# Patient Record
Sex: Female | Born: 1965 | Hispanic: Yes | Marital: Single | Smoking: Never smoker
Health system: Southern US, Community
[De-identification: ages and names within clinical notes are randomized; demographics above are authoritative.]

## PROBLEM LIST (undated history)

## (undated) DIAGNOSIS — R739 Hyperglycemia, unspecified: Secondary | ICD-10-CM

## (undated) DIAGNOSIS — T8859XA Other complications of anesthesia, initial encounter: Secondary | ICD-10-CM

## (undated) DIAGNOSIS — J302 Other seasonal allergic rhinitis: Secondary | ICD-10-CM

## (undated) DIAGNOSIS — R42 Dizziness and giddiness: Secondary | ICD-10-CM

## (undated) DIAGNOSIS — M1990 Unspecified osteoarthritis, unspecified site: Secondary | ICD-10-CM

## (undated) DIAGNOSIS — F329 Major depressive disorder, single episode, unspecified: Secondary | ICD-10-CM

## (undated) DIAGNOSIS — E785 Hyperlipidemia, unspecified: Secondary | ICD-10-CM

## (undated) DIAGNOSIS — Z87440 Personal history of urinary (tract) infections: Secondary | ICD-10-CM

## (undated) DIAGNOSIS — B019 Varicella without complication: Secondary | ICD-10-CM

## (undated) DIAGNOSIS — A630 Anogenital (venereal) warts: Secondary | ICD-10-CM

## (undated) DIAGNOSIS — T7840XA Allergy, unspecified, initial encounter: Secondary | ICD-10-CM

## (undated) DIAGNOSIS — G250 Essential tremor: Secondary | ICD-10-CM

## (undated) DIAGNOSIS — N938 Other specified abnormal uterine and vaginal bleeding: Secondary | ICD-10-CM

## (undated) DIAGNOSIS — K649 Unspecified hemorrhoids: Secondary | ICD-10-CM

## (undated) DIAGNOSIS — R0789 Other chest pain: Secondary | ICD-10-CM

## (undated) DIAGNOSIS — T4145XA Adverse effect of unspecified anesthetic, initial encounter: Secondary | ICD-10-CM

## (undated) DIAGNOSIS — E119 Type 2 diabetes mellitus without complications: Secondary | ICD-10-CM

## (undated) DIAGNOSIS — F32A Depression, unspecified: Secondary | ICD-10-CM

## (undated) DIAGNOSIS — K219 Gastro-esophageal reflux disease without esophagitis: Secondary | ICD-10-CM

## (undated) HISTORY — PX: UPPER GASTROINTESTINAL ENDOSCOPY: SHX188

## (undated) HISTORY — DX: Unspecified hemorrhoids: K64.9

## (undated) HISTORY — DX: Major depressive disorder, single episode, unspecified: F32.9

## (undated) HISTORY — DX: Personal history of urinary (tract) infections: Z87.440

## (undated) HISTORY — PX: GANGLION CYST EXCISION: SHX1691

## (undated) HISTORY — DX: Allergy, unspecified, initial encounter: T78.40XA

## (undated) HISTORY — DX: Other chest pain: R07.89

## (undated) HISTORY — DX: Anogenital (venereal) warts: A63.0

## (undated) HISTORY — PX: HYSTEROSCOPY: SHX211

## (undated) HISTORY — DX: Other complications of anesthesia, initial encounter: T88.59XA

## (undated) HISTORY — DX: Dizziness and giddiness: R42

## (undated) HISTORY — DX: Hyperlipidemia, unspecified: E78.5

## (undated) HISTORY — DX: Adverse effect of unspecified anesthetic, initial encounter: T41.45XA

## (undated) HISTORY — DX: Essential tremor: G25.0

## (undated) HISTORY — PX: WISDOM TOOTH EXTRACTION: SHX21

## (undated) HISTORY — PX: COLONOSCOPY: SHX174

## (undated) HISTORY — DX: Hyperglycemia, unspecified: R73.9

## (undated) HISTORY — DX: Other seasonal allergic rhinitis: J30.2

## (undated) HISTORY — DX: Type 2 diabetes mellitus without complications: E11.9

## (undated) HISTORY — DX: Gastro-esophageal reflux disease without esophagitis: K21.9

## (undated) HISTORY — DX: Varicella without complication: B01.9

## (undated) HISTORY — PX: TONSILLECTOMY AND ADENOIDECTOMY: SUR1326

## (undated) HISTORY — DX: Other specified abnormal uterine and vaginal bleeding: N93.8

## (undated) HISTORY — DX: Depression, unspecified: F32.A

## (undated) HISTORY — DX: Unspecified osteoarthritis, unspecified site: M19.90

---

## 2014-11-26 ENCOUNTER — Ambulatory Visit (INDEPENDENT_AMBULATORY_CARE_PROVIDER_SITE_OTHER): Payer: BC Managed Care – PPO | Admitting: Family Medicine

## 2014-11-26 ENCOUNTER — Encounter: Payer: Self-pay | Admitting: Family Medicine

## 2014-11-26 VITALS — BP 130/84 | HR 74 | Wt 193.0 lb

## 2014-11-26 DIAGNOSIS — M9903 Segmental and somatic dysfunction of lumbar region: Secondary | ICD-10-CM | POA: Diagnosis not present

## 2014-11-26 DIAGNOSIS — M999 Biomechanical lesion, unspecified: Secondary | ICD-10-CM | POA: Insufficient documentation

## 2014-11-26 DIAGNOSIS — M9901 Segmental and somatic dysfunction of cervical region: Secondary | ICD-10-CM

## 2014-11-26 DIAGNOSIS — R293 Abnormal posture: Secondary | ICD-10-CM | POA: Diagnosis not present

## 2014-11-26 DIAGNOSIS — G8929 Other chronic pain: Secondary | ICD-10-CM

## 2014-11-26 DIAGNOSIS — M9902 Segmental and somatic dysfunction of thoracic region: Secondary | ICD-10-CM | POA: Diagnosis not present

## 2014-11-26 DIAGNOSIS — M542 Cervicalgia: Secondary | ICD-10-CM | POA: Diagnosis not present

## 2014-11-26 DIAGNOSIS — M2170 Unequal limb length (acquired), unspecified site: Secondary | ICD-10-CM

## 2014-11-26 HISTORY — DX: Biomechanical lesion, unspecified: M99.9

## 2014-11-26 NOTE — Patient Instructions (Signed)
Good to see you.  Exercises 3 times a week.  2 tennisball in tube sock and lay on them at base of skull Ice 20 minutes 2 times daily. Usually after activity and before bed. Vitamin D 2000 Iu daily Turmeric 500mg  twice daily Fish oil 2 grams daily (krill oil)  Look for encapsulated Tart cherry extract at night pennsaid pinkie amount topically 2 times daily as needed.  Heel lift in right shoe about 1/4 inch On wall with heels, butt shoulder and head touching for a goal of 5 minutes daily  See me again in 4 weeks.

## 2014-11-26 NOTE — Progress Notes (Signed)
[name] [address], [place] [address] Phone: [phone] Subjective:     CC: Neck and lower back pain.  [id number] Kendra Austin is a 49 y.o. female coming in with complaint of neck and back pain. Patient has had this pain intermittently for approximately 10 year she states. Has seen many different providers for. Patient has only been given different medications and would like to avoid this. Patient states that it is not debilitating but is more of a soreness. Sometimes with some mild radicular symptoms but very minimal. Has been diagnosed previously with a protruding disc but does not have any of the images or previous office visits. Patient only moved to [place] 3 months ago. 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 Patient has tried some range of motion exercises with minimal benefit. Patient is wondering what natural medications she can do and other changes she can do to help decrease the pain. Denies any weakness or numbness that is constant. Rates the severity of discomfort as 5 out of 10. Denies any nighttime awakening.     Past Medical History  Diagnosis Date  . Arthritis   . Depression    Past Surgical History  Procedure Laterality Date  . Tonsillectomy and adenoidectomy  1971/1972   Social History  Substance Use Topics  . Smoking status: Never Smoker   . Smokeless tobacco: Never Used  . Alcohol Use: 0.0 oz/week    0 Standard drinks or equivalent per week   Not on File Family History  Problem Relation Age of Onset  . Heart disease Mother   . Diabetes Mother   . Heart disease Father   . Diabetes Father      Past medical history, social, surgical and family history all reviewed in electronic medical record.   Review of Systems: No headache, visual changes, nausea, vomiting, diarrhea, constipation, dizziness, abdominal pain, skin rash, fevers, chills, night sweats,  weight loss, swollen lymph nodes, body aches, joint swelling, muscle aches, chest pain, shortness of breath, mood changes.   Objective Blood pressure 130/84, pulse 74, weight 193 lb (87.544 kg), SpO2 98 %.  General: No apparent distress alert and oriented x3 mood and affect normal, dressed appropriately.  HEENT: Pupils equal, extraocular movements intact  Respiratory: Patient's speak in full sentences and does not appear short of breath  Cardiovascular: No lower extremity edema, non tender, no erythema  Skin: Warm dry intact with no signs of infection or rash on extremities or on axial skeleton.  Abdomen: Soft nontender  Neuro: Cranial nerves II through XII are intact, neurovascularly intact in all extremities with 2+ DTRs and 2+ pulses.  Lymph: No lymphadenopathy of posterior or anterior cervical chain or axillae bilaterally.  Gait normal with good balance and coordination.  MSK:  Non tender with full range of motion and good stability and symmetric strength and tone of shoulders, elbows, wrist, hip, knee and ankles bilaterally.  Neck: Inspection unremarkable.  Poor posture.  No palpable stepoffs. Negative Spurling's maneuver. Full neck range of motion Grip strength and sensation normal in bilateral hands Strength good C4 to T1 distribution No sensory change to C4 to T1 Negative Hoffman sign bilaterally Reflexes normal Back Exam:  Inspection: Unremarkable  Motion: Flexion 25 deg, Extension 25 deg, Side Bending to 25 deg bilaterally,  Rotation to 35 deg bilaterally  SLR laying: Negative  XSLR laying: Negative  Palpable tenderness: TTP in the paraspinal  musculature lumbar, minimal scoliosis FABER: + right Sensory change: Gross sensation intact to all lumbar and sacral dermatomes.  Reflexes: 2+ at both patellar tendons, 2+ at achilles tendons, Babinski's downgoing.  Strength at foot  Plantar-flexion: 5/5 Dorsi-flexion: 5/5 Eversion: 5/5 Inversion: 5/5  Leg strength  Quad: 5/5  Hamstring: /5 Hip flexor: 5/5 Hip abductors: 4/5 but symmetric Gait unremarkable. Leg length discrepancy with half inch shorter on the right side    OMT Physical Exam  Standing flexion right   Seated Flexion right  Cervical  C4 Flexed RS right C7 F RS left  Thoracic T3 E RS right T7 E RS left   Lumbar L2 flexed rotated and side bent left  Sacrum Right on right  Illium Right posterior ilium   Impression and Recommendations:     This case required medical decision making of moderate complexity.

## 2014-11-26 NOTE — Assessment & Plan Note (Signed)
Decision today to treat with OMT was based on Physical Exam  After verbal consent patient was treated with HVLA, ME, FPR techniques in cervical, thoracic and lumbar areas  Patient tolerated the procedure well with improvement in symptoms  Patient given exercises, stretches and lifestyle modifications  See medications in patient instructions if given  Patient will follow up in 3-4 weeks                     

## 2014-11-26 NOTE — Progress Notes (Signed)
Pre visit review using our clinic review tool, if applicable. No additional management support is needed unless otherwise documented below in the visit note. 

## 2014-11-26 NOTE — Assessment & Plan Note (Signed)
I do believe the patient's neck pain is likely second due to more of a poor posture and ergonomics throughout the day. Patient does have some weakness of the upper back. Patient even exercises by formal athletic trainer today. We discussed the possibility of formal physical therapy. Patient will try to do more ergonomic changes at work as well. We discussed icing regimen. We discussed over-the-counter natural vitamins that can be helpful. Diet and exercise. Patient and will come back and see me again in 3 weeks for further evaluation and treatment.

## 2014-12-28 ENCOUNTER — Encounter: Payer: Self-pay | Admitting: Family Medicine

## 2014-12-28 ENCOUNTER — Ambulatory Visit (INDEPENDENT_AMBULATORY_CARE_PROVIDER_SITE_OTHER): Payer: BC Managed Care – PPO | Admitting: Family Medicine

## 2014-12-28 VITALS — BP 128/84 | HR 95 | Wt 190.0 lb

## 2014-12-28 DIAGNOSIS — M9901 Segmental and somatic dysfunction of cervical region: Secondary | ICD-10-CM | POA: Diagnosis not present

## 2014-12-28 DIAGNOSIS — M542 Cervicalgia: Secondary | ICD-10-CM

## 2014-12-28 DIAGNOSIS — M9903 Segmental and somatic dysfunction of lumbar region: Secondary | ICD-10-CM

## 2014-12-28 DIAGNOSIS — M999 Biomechanical lesion, unspecified: Secondary | ICD-10-CM

## 2014-12-28 DIAGNOSIS — M9902 Segmental and somatic dysfunction of thoracic region: Secondary | ICD-10-CM

## 2014-12-28 DIAGNOSIS — G8929 Other chronic pain: Secondary | ICD-10-CM

## 2014-12-28 NOTE — Assessment & Plan Note (Signed)
Patient does have some discomfort. Patient encouraged to do more the postural changes. Once again discussed ergonomics at work. Patient will be traveling and patient was given different back[place] type exercises that she can do a hotel rooms. Patient will be gone for 3 weeks. Like to see her back after her traveling for further evaluation and treatment. Continues to respond well to osteopathic manipulation.

## 2014-12-28 NOTE — Assessment & Plan Note (Signed)
Decision today to treat with OMT was based on Physical Exam  After verbal consent patient was treated with HVLA, ME, FPR techniques in cervical, thoracic and lumbar  areas  Patient tolerated the procedure well with improvement in symptoms  Patient given exercises, stretches and lifestyle modifications  See medications in patient instructions if given  Patient will follow up in 4-6 weeks

## 2014-12-28 NOTE — Progress Notes (Signed)
Pre visit review using our clinic review tool, if applicable. No additional management support is needed unless otherwise documented below in the visit note. 

## 2014-12-28 NOTE — Patient Instructions (Signed)
Great to see you Kendra Austin is your friend Consider stomach medicine daily for 1 week.  Try to do the exercises regularly.  Safe travels and Happy holidays! See me again when you return!

## 2014-12-28 NOTE — Progress Notes (Signed)
[name] [address] [place], [place] [address] Phone: [phone] Subjective:     CC: Neck and lower back pain follow up  [id number] Delois Gravatt is a 49 y.o. female coming in with complaint of neck and back pain. Patient was found to have some muscle imbalances including a leg length discrepancy as well as poor posture. Patient did respond well to osteopathic manipulation. Patient has been doing home exercises intermittently. Did get the vitamins which seem to make a significant improvement. Has been doing icing occasionally. Denies any new symptoms and states that overall she is improving slowly. Rates the improvement is 25-35%. Patient has noticed though that she is able to have more endurance and do more activities throughout the day. No new symptoms.     Past Medical History  Diagnosis Date  . Arthritis   . Depression    Past Surgical History  Procedure Laterality Date  . Tonsillectomy and adenoidectomy  1971/1972   Social History  Substance Use Topics  . Smoking status: Never Smoker   . Smokeless tobacco: Never Used  . Alcohol Use: 0.0 oz/week    0 Standard drinks or equivalent per week   Not on File Family History  Problem Relation Age of Onset  . Heart disease Mother   . Diabetes Mother   . Heart disease Father   . Diabetes Father      Past medical history, social, surgical and family history all reviewed in electronic medical record.   Review of Systems: No headache, visual changes, nausea, vomiting, diarrhea, constipation, dizziness, abdominal pain, skin rash, fevers, chills, night sweats, weight loss, swollen lymph nodes, body aches, joint swelling, muscle aches, chest pain, shortness of breath, mood changes.   Objective Blood pressure 128/84, pulse 95, weight 190 lb (86.183 kg), SpO2 97 %.  General: No apparent distress alert and oriented x3 mood and affect normal, dressed appropriately.  HEENT: Pupils equal, extraocular  movements intact  Respiratory: Patient's speak in full sentences and does not appear short of breath  Cardiovascular: No lower extremity edema, non tender, no erythema  Skin: Warm dry intact with no signs of infection or rash on extremities or on axial skeleton.  Abdomen: Soft nontender  Neuro: Cranial nerves II through XII are intact, neurovascularly intact in all extremities with 2+ DTRs and 2+ pulses.  Lymph: No lymphadenopathy of posterior or anterior cervical chain or axillae bilaterally.  Gait normal with good balance and coordination.  MSK:  Non tender with full range of motion and good stability and symmetric strength and tone of shoulders, elbows, wrist, hip, knee and ankles bilaterally.  Neck: Inspection unremarkable.  Poor posture.  No palpable stepoffs. Negative Spurling's maneuver. Full neck range of motion Grip strength and sensation normal in bilateral hands Strength good C4 to T1 distribution No sensory change to C4 to T1 Negative Hoffman sign bilaterally Reflexes normal Back Exam:  Inspection: Unremarkable  Motion: Flexion 25 deg, Extension 25 deg, Side Bending to 25 deg bilaterally,  Rotation to 35 deg bilaterally  SLR laying: Negative  XSLR laying: Negative  Palpable tenderness: Continued mild tender to palpation of the paraspinal musculature of the lumbar spine but less than previous exam FABER: + right Sensory change: Gross sensation intact to all lumbar and sacral dermatomes.  Reflexes: 2+ at both patellar tendons, 2+ at achilles tendons, Babinski's downgoing.  Strength at foot  Plantar-flexion: 5/5 Dorsi-flexion: 5/5 Eversion: 5/5 Inversion: 5/5  Leg strength  Quad: 5/5 Hamstring: /5 Hip  flexor: 5/5 Hip abductors: 4/5 but symmetric Gait unremarkable. Leg length discrepancy on right still present    OMT Physical Exam  Standing flexion right   Seated Flexion right  Cervical  C2 flexed rotated and side bent left C4 Flexed RS right C7 F RS  left  Thoracic T3 E RS right T7 E RS left   Lumbar L2 flexed rotated and side bent left  Sacrum Right on right  Illium Neutral  Impression and Recommendations:     This case required medical decision making of moderate complexity.

## 2015-02-01 ENCOUNTER — Ambulatory Visit: Payer: BC Managed Care – PPO | Admitting: Family Medicine

## 2015-02-02 ENCOUNTER — Encounter: Payer: Self-pay | Admitting: Family Medicine

## 2015-02-02 ENCOUNTER — Ambulatory Visit: Payer: BC Managed Care – PPO | Admitting: Family Medicine

## 2015-02-02 ENCOUNTER — Ambulatory Visit (INDEPENDENT_AMBULATORY_CARE_PROVIDER_SITE_OTHER): Payer: BC Managed Care – PPO | Admitting: Family Medicine

## 2015-02-02 VITALS — BP 142/86 | HR 84 | Wt 192.0 lb

## 2015-02-02 DIAGNOSIS — M542 Cervicalgia: Secondary | ICD-10-CM | POA: Diagnosis not present

## 2015-02-02 DIAGNOSIS — M9901 Segmental and somatic dysfunction of cervical region: Secondary | ICD-10-CM

## 2015-02-02 DIAGNOSIS — M2170 Unequal limb length (acquired), unspecified site: Secondary | ICD-10-CM | POA: Diagnosis not present

## 2015-02-02 DIAGNOSIS — G8929 Other chronic pain: Secondary | ICD-10-CM | POA: Diagnosis not present

## 2015-02-02 DIAGNOSIS — M999 Biomechanical lesion, unspecified: Secondary | ICD-10-CM

## 2015-02-02 NOTE — Progress Notes (Signed)
Pre visit review using our clinic review tool, if applicable. No additional management support is needed unless otherwise documented below in the visit note. 

## 2015-02-02 NOTE — Assessment & Plan Note (Signed)
Encouraged to wear heel lift on a more regular basis.

## 2015-02-02 NOTE — Patient Instructions (Addendum)
Good to see you Happy New Year! Still the ergonomics through the day and the posture will be key.  Hip abductor strength may help.  Exercises on wall.  Heel and butt touching.  Raise leg 6 inches and hold 2 seconds.  Down slow for count of 4 seconds.  1 set of 30 reps daily on both sides.  For the neck try the TENS unit Kerry Hough ([facility]) or Lynne Leader [facility]) could be good possible PCP for you Continue the vitamins A little tighter today so probabaly should see you again  In 3-6 weeks.

## 2015-02-02 NOTE — Assessment & Plan Note (Signed)
Patient is had difficulty staying compliant. We are going continue to work on range of motion, ergonomics, as well as posture. Patient given a refracture and the home medications as well as the home exercises again. We discussed wearing the heel lift on a more regular basis. Still responding fairly well to osteopathic manipulation. Return in 3-6 weeks for further evaluation and treatment.

## 2015-02-02 NOTE — Assessment & Plan Note (Signed)
Decision today to treat with OMT was based on Physical Exam  After verbal consent patient was treated with HVLA, ME, FPR techniques in cervical, thoracic and lumbar  areas  Patient tolerated the procedure well with improvement in symptoms  Patient given exercises, stretches and lifestyle modifications  See medications in patient instructions if given  Patient will follow up in 4-6 weeks

## 2015-02-02 NOTE — Progress Notes (Signed)
[facility] [address] [place], [place] [address] Phone: [phone] Subjective:     CC: Neck and lower back pain follow up  [id number] Kendra Austin is a 50 y.o. female coming in with complaint of neck and back pain. Patient was found to have some muscle imbalances including a leg length discrepancy as well as poor posture. Patient did respond well to osteopathic manipulation. Patient was to continue with some conservative therapy. Did not have any pain medicines for breakthrough pain. Patient will do this as natural as possible. Patient was to continue to work on posture. Patient was traveling recently and did have the holidays she became a little bit inconsistent with the exercise program. Patient states sleeping and rolling on subacute hemorrhage transportation and bending seem to exacerbate some of the problems. Some mild increase and neck pain. Not wearing the heel lift as regularly.      Past Medical History  Diagnosis Date  . Arthritis   . Depression    Past Surgical History  Procedure Laterality Date  . Tonsillectomy and adenoidectomy  1971/1972   Social History  Substance Use Topics  . Smoking status: Never Smoker   . Smokeless tobacco: Never Used  . Alcohol Use: 0.0 oz/week    0 Standard drinks or equivalent per week   Not on File Family History  Problem Relation Age of Onset  . Heart disease Mother   . Diabetes Mother   . Heart disease Father   . Diabetes Father      Past medical history, social, surgical and family history all reviewed in electronic medical record.   Review of Systems: No headache, visual changes, nausea, vomiting, diarrhea, constipation, dizziness, abdominal pain, skin rash, fevers, chills, night sweats, weight loss, swollen lymph nodes, body aches, joint swelling, muscle aches, chest pain, shortness of breath, mood changes.   Objective Blood pressure 142/86, pulse 84, weight 192 lb (87.091 kg), SpO2 97 %.    General: No apparent distress alert and oriented x3 mood and affect normal, dressed appropriately.  HEENT: Pupils equal, extraocular movements intact  Respiratory: Patient's speak in full sentences and does not appear short of breath  Cardiovascular: No lower extremity edema, non tender, no erythema  Skin: Warm dry intact with no signs of infection or rash on extremities or on axial skeleton.  Abdomen: Soft nontender  Neuro: Cranial nerves II through XII are intact, neurovascularly intact in all extremities with 2+ DTRs and 2+ pulses.  Lymph: No lymphadenopathy of posterior or anterior cervical chain or axillae bilaterally.  Gait normal with good balance and coordination.  MSK:  Non tender with full range of motion and good stability and symmetric strength and tone of shoulders, elbows, wrist, hip, knee and ankles bilaterally.  Neck: Inspection unremarkable.  Poor posture.  No palpable stepoffs. Negative Spurling's maneuver. Full neck range of motion Grip strength and sensation normal in bilateral hands Strength good C4 to T1 distribution No sensory change to C4 to T1 Negative Hoffman sign bilaterally Reflexes normal Back Exam:  Inspection: Unremarkable  Motion: Flexion 25 deg, Extension 25 deg, Side Bending to 25 deg bilaterally,  Rotation to 35 deg bilaterally  SLR laying: Negative  XSLR laying: Negative  Palpable tenderness: Continued mild tender to palpation of the paraspinal musculature of the lumbar spine but less than previous exam  FABER: + right Sensory change: Gross sensation intact to all lumbar and sacral dermatomes.  Reflexes: 2+ at both patellar tendons, 2+ at achilles tendons,  Babinski's downgoing.  Strength at foot  Plantar-flexion: 5/5 Dorsi-flexion: 5/5 Eversion: 5/5 Inversion: 5/5  Leg strength  Quad: 5/5 Hamstring: /5 Hip flexor: 5/5 Hip abductors: 4/5 but symmetric Gait unremarkable. Leg length discrepancy on right still present    OMT Physical  Exam  Standing flexion right   Seated Flexion right  Cervical  C2 flexed rotated and side bent left C4 Flexed RS right C7 F RS left  Thoracic T3 E RS right T7 E RS left   Lumbar L2 flexed rotated and side bent left  Sacrum Right on right  Illium Neutral  Impression and Recommendations:     This case required medical decision making of moderate complexity.

## 2015-03-04 ENCOUNTER — Ambulatory Visit: Payer: BC Managed Care – PPO | Admitting: Family Medicine

## 2015-03-08 ENCOUNTER — Ambulatory Visit (INDEPENDENT_AMBULATORY_CARE_PROVIDER_SITE_OTHER): Payer: BC Managed Care – PPO | Admitting: Family Medicine

## 2015-03-08 ENCOUNTER — Encounter: Payer: Self-pay | Admitting: Family Medicine

## 2015-03-08 VITALS — BP 118/82 | HR 81 | Wt 190.0 lb

## 2015-03-08 DIAGNOSIS — M999 Biomechanical lesion, unspecified: Secondary | ICD-10-CM

## 2015-03-08 DIAGNOSIS — M542 Cervicalgia: Secondary | ICD-10-CM

## 2015-03-08 DIAGNOSIS — M9902 Segmental and somatic dysfunction of thoracic region: Secondary | ICD-10-CM | POA: Diagnosis not present

## 2015-03-08 DIAGNOSIS — M9903 Segmental and somatic dysfunction of lumbar region: Secondary | ICD-10-CM | POA: Diagnosis not present

## 2015-03-08 DIAGNOSIS — M9901 Segmental and somatic dysfunction of cervical region: Secondary | ICD-10-CM

## 2015-03-08 DIAGNOSIS — G8929 Other chronic pain: Secondary | ICD-10-CM

## 2015-03-08 DIAGNOSIS — M2170 Unequal limb length (acquired), unspecified site: Secondary | ICD-10-CM

## 2015-03-08 NOTE — Progress Notes (Signed)
[name] [place] [place], [place] [address] Phone: [phone] Subjective:     CC: Neck and lower back pain follow up  [id number] Kendra Austin is a 50 y.o. female coming in with complaint of neck and back pain. Patient was having an exacerbation at last exam. Patient has been doing much better. Wearing the heel lift on a regular basis. Doing the postural exercises. Started limitation. States that overall she is feeling better. None of the severe pain that was stopping her from activities. Has been cycling on a stationary bike up to an hour at a time.      Past Medical History  Diagnosis Date  . Arthritis   . Depression    Past Surgical History  Procedure Laterality Date  . Tonsillectomy and adenoidectomy  1971/1972   Social History  Substance Use Topics  . Smoking status: Never Smoker   . Smokeless tobacco: Never Used  . Alcohol Use: 0.0 oz/week    0 Standard drinks or equivalent per week   Not on File Family History  Problem Relation Age of Onset  . Heart disease Mother   . Diabetes Mother   . Heart disease Father   . Diabetes Father      Past medical history, social, surgical and family history all reviewed in electronic medical record.   Review of Systems: No headache, visual changes, nausea, vomiting, diarrhea, constipation, dizziness, abdominal pain, skin rash, fevers, chills, night sweats, weight loss, swollen lymph nodes, body aches, joint swelling, muscle aches, chest pain, shortness of breath, mood changes.   Objective Blood pressure 118/82, pulse 81, weight 190 lb (86.183 kg), SpO2 98 %.  General: No apparent distress alert and oriented x3 mood and affect normal, dressed appropriately.  HEENT: Pupils equal, extraocular movements intact  Respiratory: Patient's speak in full sentences and does not appear short of breath  Cardiovascular: No lower extremity edema, non tender, no erythema  Skin: Warm dry intact with no  signs of infection or rash on extremities or on axial skeleton.  Abdomen: Soft nontender  Neuro: Cranial nerves II through XII are intact, neurovascularly intact in all extremities with 2+ DTRs and 2+ pulses.  Lymph: No lymphadenopathy of posterior or anterior cervical chain or axillae bilaterally.  Gait normal with good balance and coordination.  MSK:  Non tender with full range of motion and good stability and symmetric strength and tone of shoulders, elbows, wrist, hip, knee and ankles bilaterally.  Neck: Inspection unremarkable.  Poor posture.  No palpable stepoffs. Negative Spurling's maneuver. Full neck range of motion Grip strength and sensation normal in bilateral hands Strength good C4 to T1 distribution No sensory change to C4 to T1 Negative Hoffman sign bilaterally Reflexes normal Back Exam:  Inspection: Unremarkable  Motion: Flexion 25 deg, Extension 25 deg, Side Bending to 25 deg bilaterally,  Rotation to 35 deg bilaterally  SLR laying: Negative  XSLR laying: Negative  Palpable tenderness: improvement with significant less tenderness than previous exam FABER: + rightstill present but improving Sensory change: Gross sensation intact to all lumbar and sacral dermatomes.  Reflexes: 2+ at both patellar tendons, 2+ at achilles tendons, Babinski's downgoing.  Strength at foot  Plantar-flexion: 5/5 Dorsi-flexion: 5/5 Eversion: 5/5 Inversion: 5/5  Leg strength  Quad: 5/5 Hamstring: /5 Hip flexor: 5/5 Hip abductors: 5 out of 5 with improvement from previous exam Gait unremarkable. Leg length discrepancy on right still present    OMT Physical Exam  Standing flexion right  Seated Flexion right  Cervical  C2 flexed rotated and side bent left C7 F RS left  Thoracic T3 E RS right  Lumbar L2 flexed rotated and side bent left  Sacrum Right on right  Illium Neutral  Impression and Recommendations:     This case required medical decision making of moderate  complexity.

## 2015-03-08 NOTE — Progress Notes (Signed)
Pre visit review using our clinic review tool, if applicable. No additional management support is needed unless otherwise documented below in the visit note. 

## 2015-03-08 NOTE — Assessment & Plan Note (Signed)
Decision today to treat with OMT was based on Physical Exam  After verbal consent patient was treated with HVLA, ME, FPR techniques in cervical, thoracic and lumbar  areas  Patient tolerated the procedure well with improvement in symptoms  Patient given exercises, stretches and lifestyle modifications  See medications in patient instructions if given  Patient will follow up in 8 weeks.            

## 2015-03-08 NOTE — Patient Instructions (Signed)
I am so impressed You are doing great  Keep doing what you are doing, big difference See me  Again in-8 weeks.

## 2015-03-08 NOTE — Assessment & Plan Note (Signed)
Patient is making significant strides. Encourage her to continue to work on posture. Discussed about core strengthening. Given her more advanced strengthening exercises today. Patient and will follow-up and see me again in 8 weeks

## 2015-03-08 NOTE — Assessment & Plan Note (Signed)
Encourage patient to continue to wear the heel left.

## 2015-05-10 ENCOUNTER — Encounter: Payer: Self-pay | Admitting: Family Medicine

## 2015-05-10 ENCOUNTER — Ambulatory Visit (INDEPENDENT_AMBULATORY_CARE_PROVIDER_SITE_OTHER): Payer: BC Managed Care – PPO | Admitting: Family Medicine

## 2015-05-10 VITALS — BP 112/80 | HR 86 | Ht 65.0 in | Wt 191.0 lb

## 2015-05-10 DIAGNOSIS — M9901 Segmental and somatic dysfunction of cervical region: Secondary | ICD-10-CM | POA: Diagnosis not present

## 2015-05-10 DIAGNOSIS — M9902 Segmental and somatic dysfunction of thoracic region: Secondary | ICD-10-CM | POA: Diagnosis not present

## 2015-05-10 DIAGNOSIS — M9903 Segmental and somatic dysfunction of lumbar region: Secondary | ICD-10-CM | POA: Diagnosis not present

## 2015-05-10 DIAGNOSIS — M999 Biomechanical lesion, unspecified: Secondary | ICD-10-CM

## 2015-05-10 DIAGNOSIS — R293 Abnormal posture: Secondary | ICD-10-CM | POA: Diagnosis not present

## 2015-05-10 NOTE — Assessment & Plan Note (Signed)
Still seems to have significant muscle imbalances that are causing poor posture which is causing most of her discomfort. Encourage patient to continue to do exercises. Patient has been fairly noncompliant. Patient will be finishing up at school. When she starts regular summer she may be a candidate for physical therapy. We are going to have her try to do more of a self-guided exercise program. Patient and will come back and see me again in 6 weeks. Continue vitamin supplementations.

## 2015-05-10 NOTE — Patient Instructions (Signed)
Good to see you  Overall you are doing great  Ice still is good.  Posture, posture posture Try to start the exercises sometime.s  Use the tennis balls Good luck in school, almost done for the year! Ranitidine 10 mg can help reflux Also consider melatonin before bed 3-5 mg Basic water is better than tap See me again in 6 weeks.

## 2015-05-10 NOTE — Progress Notes (Signed)
[name] [place] [place], [place] [address] Phone: [phone] Subjective:     CC: Neck and lower back pain follow up  [id number] Micala Milliner is a 50 y.o. female coming in with complaint of neck and back pain. Patient has not been seen for 8 weeks. Patient has been working on the posture somewhat. Has had some mild increase in stress. Patient states she has had more stress recently. Causing more upper back discomfort. Not doing the exercises regularly. Continues to take the vitamins fairly regularly. States that if she has not so stress she think she would be doing well. Pain started again in approximately 2 weeks ago. No new pains just worsening of previous pains.      Past Medical History  Diagnosis Date  . Arthritis   . Depression    Past Surgical History  Procedure Laterality Date  . Tonsillectomy and adenoidectomy  1971/1972   Social History  Substance Use Topics  . Smoking status: Never Smoker   . Smokeless tobacco: Never Used  . Alcohol Use: 0.0 oz/week    0 Standard drinks or equivalent per week   Not on File Family History  Problem Relation Age of Onset  . Heart disease Mother   . Diabetes Mother   . Heart disease Father   . Diabetes Father      Past medical history, social, surgical and family history all reviewed in electronic medical record.   Review of Systems: No headache, visual changes, nausea, vomiting, diarrhea, constipation, dizziness, abdominal pain, skin rash, fevers, chills, night sweats, weight loss, swollen lymph nodes, body aches, joint swelling, muscle aches, chest pain, shortness of breath, mood changes.   Objective There were no vitals taken for this visit.  General: No apparent distress alert and oriented x3 mood and affect normal, dressed appropriately.  HEENT: Pupils equal, extraocular movements intact  Respiratory: Patient's speak in full sentences and does not appear short of breath    Cardiovascular: No lower extremity edema, non tender, no erythema  Skin: Warm dry intact with no signs of infection or rash on extremities or on axial skeleton.  Abdomen: Soft nontender  Neuro: Cranial nerves II through XII are intact, neurovascularly intact in all extremities with 2+ DTRs and 2+ pulses.  Lymph: No lymphadenopathy of posterior or anterior cervical chain or axillae bilaterally.  Gait normal with good balance and coordination.  MSK:  Non tender with full range of motion and good stability and symmetric strength and tone of shoulders, elbows, wrist, hip, knee and ankles bilaterally.  Neck: Inspection unremarkable.  Poor posture.  No palpable stepoffs. Negative Spurling's maneuver. Full neck range of motion Grip strength and sensation normal in bilateral hands Strength good C4 to T1 distribution No sensory change to C4 to T1 Negative Hoffman sign bilaterally Reflexes normal Back Exam:  Inspection: Unremarkable  Motion: Flexion 35 deg, Extension 25 deg, Side Bending to 25 deg bilaterally,  Rotation to 35 deg bilaterally mild improvement in range of motion SLR laying: Negative  XSLR laying: Negative  Palpable tenderness:more discomfort of the upper middle back FABER: positive right side Sensory change: Gross sensation intact to all lumbar and sacral dermatomes.  Reflexes: 2+ at both patellar tendons, 2+ at achilles tendons, Babinski's downgoing.  Strength at foot  Plantar-flexion: 5/5 Dorsi-flexion: 5/5 Eversion: 5/5 Inversion: 5/5  Leg strength  Quad: 5/5 Hamstring: /5 Hip flexor: 5/5 Hip abductors: 5 out of 5 with improvement from previous exam Gait unremarkable. Leg length  discrepancy on right still present    OMT Physical Exam  Cervical  C2 flexed rotated and side bent left C7 F RS left  Thoracic T3 E RS right T8 extended rotated and side bent left  Lumbar L2 flexed rotated and side bent left  Sacrum Right on right   Impression and Recommendations:      This case required medical decision making of moderate complexity.

## 2015-05-10 NOTE — Assessment & Plan Note (Signed)
Decision today to treat with OMT was based on Physical Exam  After verbal consent patient was treated with HVLA, ME, FPR techniques in cervical, thoracic and lumbar areas  Patient tolerated the procedure well with improvement in symptoms  Patient given exercises, stretches and lifestyle modifications  See medications in patient instructions if given  Patient will follow up in 6 weeks          

## 2015-05-10 NOTE — Progress Notes (Signed)
Pre visit review using our clinic review tool, if applicable. No additional management support is needed unless otherwise documented below in the visit note. 

## 2015-06-23 ENCOUNTER — Encounter: Payer: Self-pay | Admitting: Family Medicine

## 2015-06-23 ENCOUNTER — Ambulatory Visit (INDEPENDENT_AMBULATORY_CARE_PROVIDER_SITE_OTHER): Payer: BC Managed Care – PPO | Admitting: Family Medicine

## 2015-06-23 VITALS — BP 122/78 | HR 77 | Ht 65.0 in | Wt 189.0 lb

## 2015-06-23 DIAGNOSIS — M9901 Segmental and somatic dysfunction of cervical region: Secondary | ICD-10-CM

## 2015-06-23 DIAGNOSIS — M9902 Segmental and somatic dysfunction of thoracic region: Secondary | ICD-10-CM

## 2015-06-23 DIAGNOSIS — M542 Cervicalgia: Secondary | ICD-10-CM

## 2015-06-23 DIAGNOSIS — M9903 Segmental and somatic dysfunction of lumbar region: Secondary | ICD-10-CM

## 2015-06-23 DIAGNOSIS — M999 Biomechanical lesion, unspecified: Secondary | ICD-10-CM

## 2015-06-23 DIAGNOSIS — G8929 Other chronic pain: Secondary | ICD-10-CM

## 2015-06-23 NOTE — Progress Notes (Signed)
Pre visit review using our clinic review tool, if applicable. No additional management support is needed unless otherwise documented below in the visit note. 

## 2015-06-23 NOTE — Patient Instructions (Signed)
Good to se y ou  The working out is doing great already  [employer] is your friend when you need it Stay active and when you get back continue with your regimen Have a great time and see me again in 2 months!

## 2015-06-23 NOTE — Assessment & Plan Note (Signed)
Patient overall is doing better. I do feel that posture as well as stress does play a role. Encourage her to continue with the weight lifting which I think is made some improvement. Patient does have some increased muscular definition today. We discussed which activities to do in which ones to avoid. Patient and will come back and see me again in 6-8 weeks for further evaluation and treatment. Continues respond well to osteopathic manipulation.

## 2015-06-23 NOTE — Assessment & Plan Note (Signed)
Decision today to treat with OMT was based on Physical Exam  After verbal consent patient was treated with HVLA, ME, FPR techniques in cervical, thoracic and lumbar areas  Patient tolerated the procedure well with improvement in symptoms  Patient given exercises, stretches and lifestyle modifications  See medications in patient instructions if given  Patient will follow up in 6-8 weeks                   

## 2015-06-23 NOTE — Progress Notes (Signed)
[facility] [place], [place] [address] Phone: [phone] Subjective:     CC: Neck and lower back pain follow up  [id number] [name] is a 50 y.o. female coming in with complaint of neck and back pain. Patient has not been seen for 8 weeks. Patient has been working on the posture somewhat. Has also started working on a regular basis. Feels like this is making significant improvement especially in the neck. Patient is also no longer working right now and does think that stress plays a role. Patient is happy with the results so far. Will be traveling over the course of the next 3 weeks. Patient is looking forward to this.      Past Medical History  Diagnosis Date  . Arthritis   . Depression    Past Surgical History  Procedure Laterality Date  . Tonsillectomy and adenoidectomy  1971/1972   Social History  Substance Use Topics  . Smoking status: Never Smoker   . Smokeless tobacco: Never Used  . Alcohol Use: 0.0 oz/week    0 Standard drinks or equivalent per week   Not on File Family History  Problem Relation Age of Onset  . Heart disease Mother   . Diabetes Mother   . Heart disease Father   . Diabetes Father      Past medical history, social, surgical and family history all reviewed in electronic medical record.   Review of Systems: No headache, visual changes, nausea, vomiting, diarrhea, constipation, dizziness, abdominal pain, skin rash, fevers, chills, night sweats, weight loss, swollen lymph nodes, body aches, joint swelling, muscle aches, chest pain, shortness of breath, mood changes.   Objective Blood pressure 122/78, pulse 77, height 5\' 5"  (1.651 m), weight 189 lb (85.73 kg), SpO2 99 %.  General: No apparent distress alert and oriented x3 mood and affect normal, dressed appropriately.  HEENT: Pupils equal, extraocular movements intact  Respiratory: Patient's speak in full sentences and does not appear short of breath   Cardiovascular: No lower extremity edema, non tender, no erythema  Skin: Warm dry intact with no signs of infection or rash on extremities or on axial skeleton.  Abdomen: Soft nontender  Neuro: Cranial nerves II through XII are intact, neurovascularly intact in all extremities with 2+ DTRs and 2+ pulses.  Lymph: No lymphadenopathy of posterior or anterior cervical chain or axillae bilaterally.  Gait normal with good balance and coordination.  MSK:  Non tender with full range of motion and good stability and symmetric strength and tone of shoulders, elbows, wrist, hip, knee and ankles bilaterally.  Neck: Inspection unremarkable.  Poor posture.  No palpable stepoffs. Negative Spurling's maneuver. Full neck range of motion Grip strength and sensation normal in bilateral hands Strength good C4 to T1 distribution No sensory change to C4 to T1 Negative Hoffman sign bilaterally Reflexes normal Back Exam:  Inspection: Unremarkable  Motion: Flexion 35 deg, Extension 25 deg, Side Bending to 25 deg bilaterally,  Rotation to 35 deg bilaterally mild improvement in range of motion SLR laying: Negative  XSLR laying: Negative  Palpable tenderness: Less discomfort than previous exam Sensory change: Gross sensation intact to all lumbar and sacral dermatomes.  Reflexes: 2+ at both patellar tendons, 2+ at achilles tendons, Babinski's downgoing.  Strength at foot  Plantar-flexion: 5/5 Dorsi-flexion: 5/5 Eversion: 5/5 Inversion: 5/5  Leg strength  Quad: 5/5 Hamstring: /5 Hip flexor: 5/5 Hip abductors: 5 out of 5 with improvement from previous exam Gait unremarkable. Leg  length discrepancy on right still present Stable physical exam   OMT Physical Exam  Cervical  C2 flexed rotated and side bent left C6 F RS left  Thoracic T3 E RS right T7 extended rotated and side bent left  Lumbar L2 flexed rotated and side bent left  Sacrum Right on right   Impression and Recommendations:     This  case required medical decision making of moderate complexity.

## 2015-07-22 ENCOUNTER — Encounter: Payer: Self-pay | Admitting: Family Medicine

## 2015-07-22 ENCOUNTER — Ambulatory Visit (INDEPENDENT_AMBULATORY_CARE_PROVIDER_SITE_OTHER): Payer: BC Managed Care – PPO | Admitting: Family Medicine

## 2015-07-22 VITALS — BP 134/82 | HR 82 | Temp 98.3°F | Ht 65.0 in | Wt 192.1 lb

## 2015-07-22 DIAGNOSIS — Z7189 Other specified counseling: Secondary | ICD-10-CM

## 2015-07-22 DIAGNOSIS — R112 Nausea with vomiting, unspecified: Secondary | ICD-10-CM | POA: Diagnosis not present

## 2015-07-22 DIAGNOSIS — E669 Obesity, unspecified: Secondary | ICD-10-CM

## 2015-07-22 DIAGNOSIS — Z7689 Persons encountering health services in other specified circumstances: Secondary | ICD-10-CM

## 2015-07-22 DIAGNOSIS — R1011 Right upper quadrant pain: Secondary | ICD-10-CM

## 2015-07-22 DIAGNOSIS — N938 Other specified abnormal uterine and vaginal bleeding: Secondary | ICD-10-CM | POA: Diagnosis not present

## 2015-07-22 DIAGNOSIS — H6983 Other specified disorders of Eustachian tube, bilateral: Secondary | ICD-10-CM | POA: Diagnosis not present

## 2015-07-22 DIAGNOSIS — J302 Other seasonal allergic rhinitis: Secondary | ICD-10-CM | POA: Diagnosis not present

## 2015-07-22 DIAGNOSIS — G250 Essential tremor: Secondary | ICD-10-CM

## 2015-07-22 HISTORY — DX: Essential tremor: G25.0

## 2015-07-22 HISTORY — DX: Other specified abnormal uterine and vaginal bleeding: N93.8

## 2015-07-22 HISTORY — DX: Other seasonal allergic rhinitis: J30.2

## 2015-07-22 LAB — COMPREHENSIVE METABOLIC PANEL
ALK PHOS: 50 U/L (ref 39–117)
ALT: 18 U/L (ref 0–35)
AST: 17 U/L (ref 0–37)
Albumin: 4.2 g/dL (ref 3.5–5.2)
BILIRUBIN TOTAL: 0.4 mg/dL (ref 0.2–1.2)
BUN: 14 mg/dL (ref 6–23)
CO2: 26 mEq/L (ref 19–32)
CREATININE: 0.77 mg/dL (ref 0.40–1.20)
Calcium: 10.1 mg/dL (ref 8.4–10.5)
Chloride: 106 mEq/L (ref 96–112)
GFR: 84.28 mL/min (ref >60.00)
GLUCOSE: 83 mg/dL (ref 70–99)
Potassium: 4.2 mEq/L (ref 3.5–5.1)
Sodium: 140 mEq/L (ref 135–145)
TOTAL PROTEIN: 6.7 g/dL (ref 6.0–8.3)

## 2015-07-22 LAB — HDL CHOLESTEROL: HDL: 47.6 mg/dL (ref >39.00)

## 2015-07-22 LAB — CBC WITH DIFFERENTIAL/PLATELET
BASOS PCT: 0.5 % (ref 0.0–3.0)
Basophils Absolute: 0.1 10*3/uL (ref 0.0–0.1)
EOS PCT: 0.9 % (ref 0.0–5.0)
Eosinophils Absolute: 0.1 10*3/uL (ref 0.0–0.7)
HEMATOCRIT: 44.4 % (ref 36.0–46.0)
Hemoglobin: 14.8 g/dL (ref 12.0–15.0)
LYMPHS ABS: 3.7 10*3/uL (ref 0.7–4.0)
LYMPHS PCT: 35.7 % (ref 12.0–46.0)
MCHC: 33.4 g/dL (ref 30.0–36.0)
MCV: 86.8 fl (ref 78.0–100.0)
MONOS PCT: 7.5 % (ref 3.0–12.0)
Monocytes Absolute: 0.8 10*3/uL (ref 0.1–1.0)
NEUTROS ABS: 5.8 10*3/uL (ref 1.4–7.7)
NEUTROS PCT: 55.4 % (ref 43.0–77.0)
PLATELETS: 255 10*3/uL (ref 150.0–400.0)
RBC: 5.12 Mil/uL — ABNORMAL HIGH (ref 3.87–5.11)
RDW: 13.7 % (ref 11.5–15.5)
WBC: 10.4 10*3/uL (ref 4.0–10.5)

## 2015-07-22 LAB — TSH: TSH: 1.92 u[IU]/mL (ref 0.35–4.50)

## 2015-07-22 LAB — CHOLESTEROL, TOTAL: Cholesterol: 204 mg/dL — ABNORMAL HIGH (ref 0–200)

## 2015-07-22 NOTE — Progress Notes (Signed)
HPI:  Kendra Austin is here to establish care.  Moved to [place] 1 year ago for job transfer.  Last PCP and physical: last summer with gyn reports she had labs there.   Has the following chronic problems that require follow up and concerns today:  Essential Tremor: - chronic, everyone in her family has this -action tremor, not interfering with ativities  DUB: -saw gyn in the past, on OCPs -plans to see gyn here  Ears clogged/seasonal allergies: -intermittent since moved to [place] -meds: allegra and flonase  RUQ pain: -intermittent RUQ crampy pain only after eating fatty foods -ongoing for a long time, many years -Sometimes NV with this -denies prior evaluation -reports only occurs with eating certain fatty foods, maybe once per month or less if sticks to healthy diet  ROS negative for unless reported above: fevers, unintentional weight loss, hearing or vision loss, chest pain, palpitations, struggling to breath, hemoptysis, melena, hematochezia, hematuria, falls, loc, si, thoughts of self harm  Past Medical History  Diagnosis Date  . Arthritis     osteoarthritis; neck, R shoulder  . Depression     related to stress from prior job; no medications, hospitalizations or SI  . Atypical chest pain     has had prior cardiac eval in the past  . Vertigo     resolves with chiropratic; and sees Dr. Tamala Julian  . Essential tremor 07/22/2015  . Seasonal allergies 07/22/2015  . DUB (dysfunctional uterine bleeding) 07/22/2015    Past Surgical History  Procedure Laterality Date  . Tonsillectomy and adenoidectomy  1971/1972  . Ganglion cyst excision      right wrist    Family History  Problem Relation Age of Onset  . Diabetes Mother   . Heart disease Father   . Diabetes Father   . Macular degeneration Mother   . Macular degeneration Maternal Grandmother     Social History   Social History  . Marital Status: Single    Spouse Name: N/A  . Number of Children: N/A  . Years of  Education: N/A   Social History Main Topics  . Smoking status: Never Smoker   . Smokeless tobacco: Never Used  . Alcohol Use: 0.0 oz/week    0 Standard drinks or equivalent per week  . Drug Use: No  . Sexual Activity: Not Asked   Other Topics Concern  . None   Social History Narrative   Work or School: Hydrologist; [employer]il scientist and pedagogy       Home Situation:       Spiritual Beliefs:       Lifestyle: no regular exercise; diet is pretty good - avoids gluten              Current outpatient prescriptions:  .  Cholecalciferol (VITAMIN D PO), Take by mouth., Disp: , Rfl:  .  CRYSELLE-28 0.3-30 MG-MCG tablet, Take 1 tablet by mouth daily., Disp: , Rfl: 12 .  LUTEIN PO, Take by mouth., Disp: , Rfl:  .  Multiple Vitamins-Minerals (MULTIVITAMIN ADULT PO), Take by mouth., Disp: , Rfl:  .  NON FORMULARY, Estrapause, Disp: , Rfl:  .  Omega-3 Fatty Acids (FISH OIL PO), Take by mouth., Disp: , Rfl:  .  TURMERIC PO, Take by mouth., Disp: , Rfl:   EXAM:  Filed Vitals:   07/22/15 1319  BP: 134/82  Pulse: 82  Temp: 98.3 F (36.8 C)    Body mass index is 31.97 kg/(m^2).  GENERAL: vitals reviewed and listed above,  alert, oriented, appears well hydrated and in no acute distress  HEENT: atraumatic, conjunttiva clear, no obvious abnormalities on inspection of external nose and ears  NECK: no obvious masses on inspection  LUNGS: clear to auscultation bilaterally, no wheezes, rales or rhonchi, good air movement  CV: HRRR, no peripheral edema  MS: moves all extremities without noticeable abnormality  PSYCH: pleasant and cooperative, no obvious depression or anxiety  ASSESSMENT AND PLAN:  Discussed the following assessment and plan: More than 50% of over 40 minutes spent in total in caring for this patient was spent face-to-face with the patient, counseling and/or coordinating care.    RUQ pain - Plan: CBC with Differential/Platelets, Comprehensive metabolic panel, CANCELED:  CMP with eGFR Non-intractable vomiting with nausea, vomiting of unspecified type - Plan: Comprehensive metabolic panel -labs, [place] -benign exam today -query GB dz  ETD (eustachian tube dysfunction), bilateral Seasonal allergies -INS, allegra  DUB (dysfunctional uterine bleeding) -wants to see gyn in [place], list provided  Essential tremor -stable  Obesity - Plan: TSH, Cholesterol, Total, HDL cholesterol, Comprehensive metabolic panel -lifestyle recs  Establish care -We reviewed the PMH, PSH, FH, SH, Meds and Allergies. -We provided refills for any medications we will prescribe as needed. -We addressed current concerns per orders and patient instructions. -We have asked for records for pertinent exams, studies, vaccines and notes from previous providers. -We have advised patient to follow up per instructions below.   -Patient advised to return or notify a doctor immediately if symptoms worsen or persist or new concerns arise.  Patient Instructions  BEFORE YOU LEAVE: -follow up: CPE in 3-4 months -labs  For the concerns with the Gallbladder: -labs today -We placed a referral for you as discussed fo an ultrasound. It usually takes about 1-2 weeks to process and schedule this referral. If you have not heard from [place] regarding this appointment in 2 weeks please contact our office.  For the ears: -flonase 2 sprays each nostril daily for 1 month, then 1 spray each nostril daily -allegra during bad seasons  We recommend the following healthy lifestyle measures: - eat a healthy whole foods diet consisting of regular small meals composed of vegetables, fruits, beans, nuts, seeds, healthy meats such as white chicken and fish and whole grains.  - avoid sweets, white starchy foods, fried foods, fast food, processed foods, sodas, red meet and other fattening foods.  - get a least 150-300 minutes of aerobic exercise per week.   We have ordered labs or studies at this visit. It can take up  to 1-2 weeks for results and processing. IF results require follow up or explanation, we will call you with instructions. Clinically stable results will be released to your [facility]. If you have not heard from [place] or cannot find your results in [facility] in 2 weeks please contact our office at [phone].  If you are not yet signed up for [facility], please consider signing up.            Colin Benton R.   Review daily

## 2015-07-22 NOTE — Patient Instructions (Addendum)
BEFORE YOU LEAVE: -follow up: CPE in 3-4 months -labs  For the concerns with the Gallbladder: -labs today -We placed a referral for you as discussed fo an ultrasound. It usually takes about 1-2 weeks to process and schedule this referral. If you have not heard from [place] regarding this appointment in 2 weeks please contact our office.  For the ears: -flonase 2 sprays each nostril daily for 1 month, then 1 spray each nostril daily -allegra during bad seasons  We recommend the following healthy lifestyle measures: - eat a healthy whole foods diet consisting of regular small meals composed of vegetables, fruits, beans, nuts, seeds, healthy meats such as white chicken and fish and whole grains.  - avoid sweets, white starchy foods, fried foods, fast food, processed foods, sodas, red meet and other fattening foods.  - get a least 150-300 minutes of aerobic exercise per week.   We have ordered labs or studies at this visit. It can take up to 1-2 weeks for results and processing. IF results require follow up or explanation, we will call you with instructions. Clinically stable results will be released to your [facility]. If you have not heard from [place] or cannot find your results in [facility] in 2 weeks please contact our office at [phone].  If you are not yet signed up for [facility], please consider signing up.

## 2015-07-22 NOTE — Progress Notes (Signed)
Pre visit review using our clinic review tool, if applicable. No additional management support is needed unless otherwise documented below in the visit note. 

## 2015-07-23 ENCOUNTER — Telehealth: Payer: Self-pay | Admitting: Family Medicine

## 2015-07-23 NOTE — Telephone Encounter (Signed)
Pt returned your call.  

## 2015-07-23 NOTE — Telephone Encounter (Signed)
See results note. 

## 2015-08-02 ENCOUNTER — Ambulatory Visit
Admission: RE | Admit: 2015-08-02 | Discharge: 2015-08-02 | Disposition: A | Discharge status: Home / Self Care | Payer: BC Managed Care – PPO | Source: Ambulatory Visit | Attending: Family Medicine | Admitting: Family Medicine

## 2015-08-02 DIAGNOSIS — R1011 Right upper quadrant pain: Secondary | ICD-10-CM

## 2015-08-02 DIAGNOSIS — R112 Nausea with vomiting, unspecified: Secondary | ICD-10-CM

## 2015-08-17 NOTE — Assessment & Plan Note (Signed)
Decision today to treat with OMT was based on Physical Exam  After verbal consent patient was treated with HVLA, ME, FPR techniques in cervical, thoracic and lumbar  areas  Patient tolerated the procedure well with improvement in symptoms  Patient given exercises, stretches and lifestyle modifications  See medications in patient instructions if given  Patient will follow up in 8-10 weeks

## 2015-08-17 NOTE — Assessment & Plan Note (Signed)
Encourage patient to continue to work on ergonomics are up-to-date. Given more postural exercises and scapular stabilization techniques today. We discussed which activities to do in which ones to avoid. Patient come back and see me again in 8-12 weeks.

## 2015-08-17 NOTE — Assessment & Plan Note (Signed)
Continue heel lift.

## 2015-08-17 NOTE — Progress Notes (Signed)
[name] [address] [place], [place] [address] Phone: [phone] Subjective:     CC: Neck and lower back pain follow up  [id number]  Kendra Austin is a 50 y.o. female coming in with complaint of neck and back pain. Patient is been doing very good. Conservative therapy. Patient does respond fairly well to osteopathic manipulation at last follow-up. Patient has been working on her poor posture as well as leg length discrepancy. Patient states overall she is been doing relatively well. 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 Otherwise has been doing very well. Feels that vacation has been very helpful. We'll be starting her job again in 2 weeks which could be giving her some mild discomfort.      Past Medical History:  Diagnosis Date  . Arthritis    osteoarthritis; neck, R shoulder  . Atypical chest pain    has had prior cardiac eval in the past  . Depression    related to stress from prior job; no medications, hospitalizations or SI  . DUB (dysfunctional uterine bleeding) 07/22/2015  . Essential tremor 07/22/2015  . Seasonal allergies 07/22/2015  . Vertigo    resolves with chiropratic; and sees Dr. Tamala Julian   Past Surgical History:  Procedure Laterality Date  . GANGLION CYST EXCISION     right wrist  . TONSILLECTOMY AND ADENOIDECTOMY  1971/1972   Social History  Substance Use Topics  . Smoking status: Never Smoker  . Smokeless tobacco: Never Used  . Alcohol use 0.0 oz/week   Allergies  Allergen Reactions  . Aleve [Naproxen Sodium]     Heart palpitations - can take ibuprofen  . Flexeril [Cyclobenzaprine] Other (See Comments)    Makes her extremely tired  . Meloxicam     Made her depressed   Family History  Problem Relation Age of Onset  . Diabetes Mother   . Heart disease Father   . Diabetes Father   . Macular degeneration Mother   .  Macular degeneration Maternal Grandmother      Past medical history, social, surgical and family history all reviewed in electronic medical record.   Review of Systems: No headache, visual changes, nausea, vomiting, diarrhea, constipation, dizziness, abdominal pain, skin rash, fevers, chills, night sweats, weight loss, swollen lymph nodes, body aches, joint swelling, muscle aches, chest pain, shortness of breath, mood changes.   Objective  Blood pressure 114/80, pulse 80, weight 194 lb (88 kg), last menstrual period 07/01/2015, SpO2 98 %.  General: No apparent distress alert and oriented x3 mood and affect normal, dressed appropriately.  HEENT: Pupils equal, extraocular movements intact  Respiratory: Patient's speak in full sentences and does not appear short of breath  Cardiovascular: No lower extremity edema, non tender, no erythema  Skin: Warm dry intact with no signs of infection or rash on extremities or on axial skeleton.  Abdomen: Soft nontender  Neuro: Cranial nerves II through XII are intact, neurovascularly intact in all extremities with 2+ DTRs and 2+ pulses.  Lymph: No lymphadenopathy of posterior or anterior cervical chain or axillae bilaterally.  Gait normal with good balance and coordination.  MSK:  Non tender with full range of motion and good stability and symmetric strength and tone of shoulders, elbows, wrist, hip, knee and ankles bilaterally.  Neck: Inspection unremarkable.  Poor posture.  No palpable stepoffs. Negative Spurling's maneuver. Full neck range  of motion Barry minimal tightness with left-sided rotation Grip strength and sensation normal in bilateral hands Strength good C4 to T1 distribution No sensory change to C4 to T1 Negative Hoffman sign bilaterally Reflexes normal Back Exam:  Inspection: Unremarkable  Motion: Flexion 35 deg, Extension 25 deg, Side Bending to 25 deg bilaterally,  Rotation to 35 deg bilaterally mild improvement in range of  motion SLR laying: Negative  XSLR laying: Negative  Palpable tenderness: minimal discomfort of the paraspinal musculature of the lumbar spine Sensory change: Gross sensation intact to all lumbar and sacral dermatomes.  Reflexes: 2+ at both patellar tendons, 2+ at achilles tendons, Babinski's downgoing.  Strength at foot  Plantar-flexion: 5/5 Dorsi-flexion: 5/5 Eversion: 5/5 Inversion: 5/5  Leg strength  Quad: 5/5 Hamstring: /5 Hip flexor: 5/5 Hip abductors: 5 out of 5 with improvement from previous exam Gait unremarkable. Leg length discrepancy on right still present     OMT Physical Exam  Cervical  C2 flexed rotated and side bent left C6 F RS left  Thoracic T3 E RS right T7 extended rotated and side bent left  Lumbar L2 flexed rotated and side bent left  Sacrum Right on right   Impression and Recommendations:     This case required medical decision making of moderate complexity.

## 2015-08-18 ENCOUNTER — Ambulatory Visit (INDEPENDENT_AMBULATORY_CARE_PROVIDER_SITE_OTHER): Payer: BC Managed Care – PPO | Admitting: Family Medicine

## 2015-08-18 ENCOUNTER — Encounter: Payer: Self-pay | Admitting: Family Medicine

## 2015-08-18 VITALS — BP 114/80 | HR 80 | Wt 194.0 lb

## 2015-08-18 DIAGNOSIS — R293 Abnormal posture: Secondary | ICD-10-CM | POA: Diagnosis not present

## 2015-08-18 DIAGNOSIS — M9901 Segmental and somatic dysfunction of cervical region: Secondary | ICD-10-CM | POA: Diagnosis not present

## 2015-08-18 DIAGNOSIS — M999 Biomechanical lesion, unspecified: Secondary | ICD-10-CM

## 2015-08-18 DIAGNOSIS — M2170 Unequal limb length (acquired), unspecified site: Secondary | ICD-10-CM

## 2015-08-18 DIAGNOSIS — M9903 Segmental and somatic dysfunction of lumbar region: Secondary | ICD-10-CM | POA: Diagnosis not present

## 2015-08-18 DIAGNOSIS — M9902 Segmental and somatic dysfunction of thoracic region: Secondary | ICD-10-CM | POA: Diagnosis not present

## 2015-08-18 NOTE — Patient Instructions (Signed)
Good to see you  [employer] is your friend Stay active I am impressed See me again in 2 months  

## 2015-08-18 NOTE — Progress Notes (Signed)
Pre visit review using our clinic review tool, if applicable. No additional management support is needed unless otherwise documented below in the visit note. 

## 2015-10-04 NOTE — Progress Notes (Signed)
[name] [address] [place], [place] [address] Phone: [phone] Subjective:     CC: Neck and lower back pain follow up  [id number]  Kendra Austin is a 50 y.o. female coming in with complaint of neck and back pain. Patient is been doing very good. Conservative therapy. Patient does respond fairly well to osteopathic manipulation at last follow-up. 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  Has had some increasing stress as well. Patient states that this is has more upper back and neck pain. No radiation down the arms. Patient continues to have some of the vertigo.      Past Medical History:  Diagnosis Date  . Arthritis    osteoarthritis; neck, R shoulder  . Atypical chest pain    has had prior cardiac eval in the past  . Depression    related to stress from prior job; no medications, hospitalizations or SI  . DUB (dysfunctional uterine bleeding) 07/22/2015  . Essential tremor 07/22/2015  . Seasonal allergies 07/22/2015  . Vertigo    resolves with chiropratic; and sees Dr. Tamala Julian   Past Surgical History:  Procedure Laterality Date  . GANGLION CYST EXCISION     right wrist  . TONSILLECTOMY AND ADENOIDECTOMY  1971/1972   Social History  Substance Use Topics  . Smoking status: Never Smoker  . Smokeless tobacco: Never Used  . Alcohol use 0.0 oz/week   Allergies  Allergen Reactions  . Aleve [Naproxen Sodium]     Heart palpitations - can take ibuprofen  . Flexeril [Cyclobenzaprine] Other (See Comments)    Makes her extremely tired  . Meloxicam     Made her depressed   Family History  Problem Relation Age of Onset  . Diabetes Mother   . Heart disease Father   . Diabetes Father   . Macular degeneration Mother   . Macular degeneration Maternal Grandmother      Past medical history, social, surgical and family history all reviewed in electronic medical record.   Review of Systems: No headache, visual changes, nausea,  vomiting, diarrhea, constipation, dizziness, abdominal pain, skin rash, fevers, chills, night sweats, weight loss, swollen lymph nodes, body aches, joint swelling, muscle aches, chest pain, shortness of breath, mood changes.   Objective  Blood pressure 128/80, pulse 97, weight 197 lb (89.4 kg), SpO2 99 %.  General: No apparent distress alert and oriented x3 mood and affect normal, dressed appropriately.  HEENT: Pupils equal, extraocular movements intact  Respiratory: Patient's speak in full sentences and does not appear short of breath  Cardiovascular: No lower extremity edema, non tender, no erythema  Skin: Warm dry intact with no signs of infection or rash on extremities or on axial skeleton.  Abdomen: Soft nontender  Neuro: Cranial nerves II through XII are intact, neurovascularly intact in all extremities with 2+ DTRs and 2+ pulses.  Lymph: No lymphadenopathy of posterior or anterior cervical chain or axillae bilaterally.  Gait normal with good balance and coordination.  MSK:  Non tender with full range of motion and good stability and symmetric strength and tone of shoulders, elbows, wrist, hip, knee and ankles bilaterally.  Neck: Inspection unremarkable.  Poor posture.  No palpable stepoffs. Negative Spurling's maneuver. Increasing tightness with left-sided rotation and side bending compared to previous exam Grip strength and sensation normal in bilateral hands Strength good C4 to T1 distribution No sensory change to C4 to T1 Negative Hoffman sign bilaterally Reflexes normal Back Exam:  Inspection: Unremarkable  Motion: Flexion 35 deg, Extension 25 deg, Side Bending to 25 deg bilaterally,  Rotation to 35 deg bilaterally mild improvement in range of motion SLR laying: Negative  XSLR laying: Negative  Palpable tenderness: More discomfort in the thoracolumbar juncture Sensory change: Gross sensation intact to all lumbar and sacral dermatomes.  Reflexes: 2+ at both patellar tendons,  2+ at achilles tendons, Babinski's downgoing.  Strength at foot  Plantar-flexion: 5/5 Dorsi-flexion: 5/5 Eversion: 5/5 Inversion: 5/5  Leg strength  Quad: 5/5 Hamstring: /5 Hip flexor: 5/5 Hip abductors: 5 out of 5 with improvement from previous exam Gait unremarkable. Leg length discrepancy on right still present     OMT Physical Exam  Cervical  C2 flexed rotated and side bent left C7 flexed rotated and side bent left  Thoracic T4 extended rotated and side bent right T7 extended rotated and side bent left  Lumbar L2 flexed rotated and side bent left  Sacrum Right on right   Impression and Recommendations:     This case required medical decision making of moderate complexity.

## 2015-10-05 ENCOUNTER — Ambulatory Visit (INDEPENDENT_AMBULATORY_CARE_PROVIDER_SITE_OTHER): Payer: BC Managed Care – PPO | Admitting: Family Medicine

## 2015-10-05 ENCOUNTER — Encounter: Payer: Self-pay | Admitting: Family Medicine

## 2015-10-05 VITALS — BP 128/80 | HR 97 | Wt 197.0 lb

## 2015-10-05 DIAGNOSIS — R293 Abnormal posture: Secondary | ICD-10-CM | POA: Diagnosis not present

## 2015-10-05 DIAGNOSIS — M9902 Segmental and somatic dysfunction of thoracic region: Secondary | ICD-10-CM

## 2015-10-05 DIAGNOSIS — M9903 Segmental and somatic dysfunction of lumbar region: Secondary | ICD-10-CM | POA: Diagnosis not present

## 2015-10-05 DIAGNOSIS — M999 Biomechanical lesion, unspecified: Secondary | ICD-10-CM

## 2015-10-05 DIAGNOSIS — M9901 Segmental and somatic dysfunction of cervical region: Secondary | ICD-10-CM | POA: Diagnosis not present

## 2015-10-05 MED ORDER — IBUPROFEN-FAMOTIDINE 800-26.6 MG PO TABS
ORAL_TABLET | ORAL | 3 refills | Status: DC
Start: 2015-10-05 — End: 2015-12-07

## 2015-10-05 NOTE — Patient Instructions (Signed)
Good to see you  Duexis up to 3 times a day as needed, they will send it to your house.  Ice is your friend Keep working on the posture See me again in 4-6 weeks.

## 2015-10-05 NOTE — Assessment & Plan Note (Signed)
Encouraged patient to continue to work on posture. Patient seems to be not doing quite as well. Some increasing stress recently. We discussed icing regimen, home exercises in which activities to do an which was potentially avoid. Discussion for ibuprofen also prescribed. Follow-up again in 4-6 weeks.

## 2015-10-05 NOTE — Assessment & Plan Note (Signed)
Decision today to treat with OMT was based on Physical Exam  After verbal consent patient was treated with HVLA, ME, FPR techniques in cervical, thoracic and lumbar  areas  Patient tolerated the procedure well with improvement in symptoms  Patient given exercises, stretches and lifestyle modifications  See medications in patient instructions if given  Patient will follow up in 4-6 weeks

## 2015-10-19 ENCOUNTER — Ambulatory Visit: Payer: BC Managed Care – PPO | Admitting: Family Medicine

## 2015-10-21 LAB — HM PAP SMEAR: HM Pap smear: NEGATIVE

## 2015-10-22 ENCOUNTER — Encounter: Payer: Self-pay | Admitting: Family Medicine

## 2015-11-01 NOTE — Assessment & Plan Note (Signed)
Decision today to treat with OMT was based on Physical Exam  After verbal consent patient was treated with HVLA, ME, FPR techniques in cervical, thoracic and lumbar  areas  Patient tolerated the procedure well with improvement in symptoms  Patient given exercises, stretches and lifestyle modifications  See medications in patient instructions if given  Patient will follow up in 8-10 weeks.

## 2015-11-01 NOTE — Assessment & Plan Note (Signed)
Seems back to her baseline at this time. Mild changes in medications as described and the patient instructions. Encourage ergonomics and we discussed sitting position at work. Patient will follow-up again in 8-10 weeks.

## 2015-11-01 NOTE — Progress Notes (Signed)
[name] [place], [place] [address] Phone: [phone] Subjective:     CC: Neck and lower back pain follow up  [id number]  Kendra Austin is a 50 y.o. female coming in with complaint of neck and back pain. Was having some vertigo as well as some worsening symptoms at last exam. Patient was seen 4 weeks ago. Patient states overall she seems to be doing better. Still not doing the exercises regularly. Still having difficulty with posture throughout the day. Patient also having difficulty even taking the over-the-counter medications regularly.      Past Medical History:  Diagnosis Date  . Arthritis    osteoarthritis; neck, R shoulder  . Atypical chest pain    has had prior cardiac eval in the past  . Depression    related to stress from prior job; no medications, hospitalizations or SI  . DUB (dysfunctional uterine bleeding) 07/22/2015  . Essential tremor 07/22/2015  . Seasonal allergies 07/22/2015  . Vertigo    resolves with chiropratic; and sees Dr. Tamala Julian   Past Surgical History:  Procedure Laterality Date  . GANGLION CYST EXCISION     right wrist  . TONSILLECTOMY AND ADENOIDECTOMY  1971/1972   Social History  Substance Use Topics  . Smoking status: Never Smoker  . Smokeless tobacco: Never Used  . Alcohol use 0.0 oz/week   Allergies  Allergen Reactions  . Aleve [Naproxen Sodium]     Heart palpitations - can take ibuprofen  . Flexeril [Cyclobenzaprine] Other (See Comments)    Makes her extremely tired  . Meloxicam     Made her depressed   Family History  Problem Relation Age of Onset  . Diabetes Mother   . Heart disease Father   . Diabetes Father   . Macular degeneration Mother   . Macular degeneration Maternal Grandmother      Past medical history, social, surgical and family history all reviewed in electronic medical record.   Review of Systems: No headache, visual changes, nausea, vomiting, diarrhea,  constipation, dizziness, abdominal pain, skin rash, fevers, chills, night sweats, weight loss, swollen lymph nodes,  chest pain, shortness of breath, mood changes.   Objective  Blood pressure 120/82, pulse 80, weight 199 lb (90.3 kg), SpO2 98 %.  General: No apparent distress alert and oriented x3 mood and affect normal, dressed appropriately.  HEENT: Pupils equal, extraocular movements intact  Respiratory: Patient's speak in full sentences and does not appear short of breath  Cardiovascular: No lower extremity edema, non tender, no erythema  Skin: Warm dry intact with no signs of infection or rash on extremities or on axial skeleton.  Abdomen: Soft nontender  Neuro: Cranial nerves II through XII are intact, neurovascularly intact in all extremities with 2+ DTRs and 2+ pulses.  Lymph: No lymphadenopathy of posterior or anterior cervical chain or axillae bilaterally.  Gait normal with good balance and coordination.  MSK:  Non tender with full range of motion and good stability and symmetric strength and tone of shoulders, elbows, wrist, hip, knee and ankles bilaterally.  Neck: Inspection unremarkable.  Poor posture still present No palpable stepoffs. Negative Spurling's maneuver. Less tightness than previous exam with full range of motion Grip strength and sensation normal in bilateral hands Strength good C4 to T1 distribution No sensory change to C4 to T1 Negative Hoffman sign bilaterally Reflexes normal Back Exam:  Inspection: Unremarkable  Motion: Flexion 45 deg, Extension 25 deg, Side Bending to 35 deg bilaterally,  Rotation to 35 deg bilaterally another interval improvement SLR laying: Negative  XSLR laying: Negative  Palpable tenderness: Less discomfort in the thoracolumbar juncture Sensory change: Gross sensation intact to all lumbar and sacral dermatomes.  Reflexes: 2+ at both patellar tendons, 2+ at achilles tendons, Babinski's downgoing.  Strength at foot  Plantar-flexion:  5/5 Dorsi-flexion: 5/5 Eversion: 5/5 Inversion: 5/5  Leg strength  Quad: 5/5 Hamstring: /5 Hip flexor: 5/5 Hip abductors: 5 out of 5 Gait unremarkable. Leg length discrepancy on right still present     OMT Physical Exam  Cervical  C2 flexed rotated and side bent left C6 flexed rotated and side bent left  Thoracic T4 extended rotated and side bent right T8 extended rotated and side bent left  Lumbar L2 flexed rotated and side bent left  Sacrum Right on right   Impression and Recommendations:     This case required medical decision making of moderate complexity.

## 2015-11-02 ENCOUNTER — Ambulatory Visit (INDEPENDENT_AMBULATORY_CARE_PROVIDER_SITE_OTHER): Payer: BC Managed Care – PPO | Admitting: Family Medicine

## 2015-11-02 ENCOUNTER — Encounter: Payer: Self-pay | Admitting: Family Medicine

## 2015-11-02 VITALS — BP 120/82 | HR 80 | Wt 199.0 lb

## 2015-11-02 DIAGNOSIS — R293 Abnormal posture: Secondary | ICD-10-CM | POA: Diagnosis not present

## 2015-11-02 DIAGNOSIS — M999 Biomechanical lesion, unspecified: Secondary | ICD-10-CM | POA: Diagnosis not present

## 2015-11-02 NOTE — Patient Instructions (Signed)
God to se eyou  Ice is your friend when needed Posture, posture posture.  Try to incorporate some time for yourself and exercise.  Prilosec daily for 2 weeks.  Increase vitamin D  See me again in 6-8 weeks.

## 2015-11-04 ENCOUNTER — Encounter: Payer: BC Managed Care – PPO | Admitting: Family Medicine

## 2015-11-09 ENCOUNTER — Encounter: Payer: Self-pay | Admitting: Family Medicine

## 2015-11-09 ENCOUNTER — Ambulatory Visit (INDEPENDENT_AMBULATORY_CARE_PROVIDER_SITE_OTHER): Payer: BC Managed Care – PPO | Admitting: Family Medicine

## 2015-11-09 VITALS — BP 110/76 | HR 78 | Temp 98.5°F | Ht 65.0 in | Wt 195.1 lb

## 2015-11-09 DIAGNOSIS — G250 Essential tremor: Secondary | ICD-10-CM | POA: Diagnosis not present

## 2015-11-09 DIAGNOSIS — Z6832 Body mass index (BMI) 32.0-32.9, adult: Secondary | ICD-10-CM | POA: Diagnosis not present

## 2015-11-09 DIAGNOSIS — Z0000 Encounter for general adult medical examination without abnormal findings: Secondary | ICD-10-CM

## 2015-11-09 DIAGNOSIS — K219 Gastro-esophageal reflux disease without esophagitis: Secondary | ICD-10-CM

## 2015-11-09 LAB — LIPID PANEL
CHOLESTEROL: 190 mg/dL (ref 0–200)
HDL: 48.5 mg/dL (ref >39.00)
LDL Cholesterol: 110 mg/dL — ABNORMAL HIGH (ref 0–99)
NONHDL: 141.13
Total CHOL/HDL Ratio: 4
Triglycerides: 155 mg/dL — ABNORMAL HIGH (ref 0.0–149.0)
VLDL: 31 mg/dL (ref 0.0–40.0)

## 2015-11-09 LAB — HEMOGLOBIN A1C: HEMOGLOBIN A1C: 6.4 % (ref 4.6–6.5)

## 2015-11-09 NOTE — Progress Notes (Signed)
HPI:  Here for CPE:  -Concerns and/or follow up today:  GERD: -reports chronic, intermittent, 2ndary to overeating -reports eval with GI and EGD about in 2016 ok -took ppi in the past but it made her anxious -symptoms include reflux, heartburn -Denies unexplained weight loss, dysphagia, vomiting, abdominal pain or wheezing  Essential Tremor: - chronic, everyone in her family has this -action tremor, not interfering with ativities  DUB: -saw gyn in the past, on OCPs -now seeing gyn here and reports had her gyn exam  -Diet: variety of foods, balance and well rounded, larger portion sizes  -Exercise: no regular exercise  -Taking folic acid, vitamin D or calcium: Takes vitamin D  -Diabetes and Dyslipidemia Screening: Lasting for lab work  -Hx of HTN: no  -Vaccines: UTD except for flu shot which she plans to do at her work  -pap history: Reports up-to-date and done with GYN  -wants STI testing (Hep C if born [birth year]): no  -FH breast, colon or ovarian ca: see FH Last mammogram: Reports she does mammograms and women's health with GYN Last colon cancer screening: Discussed options and she wishes to proceed with Cologuard  -Alcohol, Tobacco, drug use: see social history  Review of Systems - no fevers, unintentional weight loss, vision loss, hearing loss, chest pain, sob, hemoptysis, melena, hematochezia, hematuria, genital discharge, changing or concerning skin lesions, bleeding, bruising, loc, thoughts of self harm or SI  Past Medical History:  Diagnosis Date  . Arthritis    osteoarthritis; neck, R shoulder  . Atypical chest pain    has had prior cardiac eval in the past  . Depression    related to stress from prior job; no medications, hospitalizations or SI  . DUB (dysfunctional uterine bleeding) 07/22/2015  . Essential tremor 07/22/2015  . Seasonal allergies 07/22/2015  . Vertigo    resolves with chiropratic; and sees Dr. Tamala Julian    Past Surgical History:   Procedure Laterality Date  . GANGLION CYST EXCISION     right wrist  . TONSILLECTOMY AND ADENOIDECTOMY  1971/1972    Family History  Problem Relation Age of Onset  . Diabetes Mother   . Heart disease Father   . Diabetes Father   . Macular degeneration Mother   . Macular degeneration Maternal Grandmother     Social History   Social History  . Marital status: Single    Spouse name: N/A  . Number of children: N/A  . Years of education: N/A   Social History Main Topics  . Smoking status: Never Smoker  . Smokeless tobacco: Never Used  . Alcohol use 0.0 oz/week  . Drug use: No  . Sexual activity: Not Asked   Other Topics Concern  . None   Social History Narrative   Work or School: Hydrologist; [employer]il scientist and pedagogy       Home Situation:       Spiritual Beliefs:       Lifestyle: no regular exercise; diet is pretty good - avoids gluten              Current Outpatient Prescriptions:  .  Cholecalciferol (VITAMIN D PO), Take by mouth., Disp: , Rfl:  .  CRYSELLE-28 0.3-30 MG-MCG tablet, Take 1 tablet by mouth daily., Disp: , Rfl: 12 .  fluticasone (FLONASE) 50 MCG/ACT nasal spray, Place 2 sprays into both nostrils daily., Disp: , Rfl:  .  Ibuprofen-Famotidine 800-26.6 MG TABS, Take 1 tablet 3 times daily., Disp: 270 tablet, Rfl: 3 .  LUTEIN  PO, Take by mouth., Disp: , Rfl:  .  Multiple Vitamins-Minerals (MULTIVITAMIN ADULT PO), Take by mouth., Disp: , Rfl:  .  NON FORMULARY, Estrapause, Disp: , Rfl:  .  Omega-3 Fatty Acids (FISH OIL PO), Take by mouth., Disp: , Rfl:  .  TURMERIC PO, Take by mouth., Disp: , Rfl:   EXAM:  Vitals:   11/09/15 1119  BP: 110/76  Pulse: 78  Temp: 98.5 F (36.9 C)   Body mass index is 32.47 kg/m. GENERAL: vitals reviewed and listed below, alert, oriented, appears well hydrated and in no acute distress  HEENT: head atraumatic, PERRLA, normal appearance of eyes, ears, nose and mouth. moist mucus membranes.  NECK: supple, no masses  or lymphadenopathy  LUNGS: clear to auscultation bilaterally, no rales, rhonchi or wheeze  CV: HRRR, no peripheral edema or cyanosis, normal pedal pulses  BREAST: Declined, does with GYN  ABDOMEN: bowel sounds normal, soft, non tender to palpation, no masses, no rebound or guarding  GU: Declined, does with GYN  RECTAL: refused  SKIN: no rash or abnormal lesions  MS: normal gait, moves all extremities normally  NEURO: normal gait, speech and thought processing grossly intact, muscle tone grossly intact throughout  PSYCH: normal affect, pleasant and cooperative  ASSESSMENT AND PLAN:  Discussed the following assessment and plan:  Encounter for preventive health examination - Plan: Lipid Panel, Hemoglobin A1c -Discussed and advised all [place] preventive services health task force level A and B recommendations for age, sex and risks. -Advised at least 150 minutes of exercise per week and a healthy diet with avoidance of (less then 1 serving per week) processed foods, white starches, red meat, fast foods and sweets and consisting of: * 5-9 servings of fresh fruits and vegetables (not corn or potatoes) *nuts and seeds, beans *olives and olive oil *lean meats such as fish and white chicken  *whole grains  -labs, studies and vaccines per orders this encounter  Gastroesophageal reflux disease, esophagitis presence not specified -opted to start with lifestyle/diet changes -zantac if needed -advised f/u or phone update in one month  Essential tremor -stable  BMI 32.0-32.9,adult -lifestyle recs, see pt handout    Orders Placed This Encounter  Procedures  . Lipid Panel  . Hemoglobin A1c    Patient advised to return to clinic immediately if symptoms worsen or persist or new concerns.  Patient Instructions  BEFORE YOU LEAVE: -order cologuard -follow up: in one month regarding the acid reflux -labs  Try the diet for acid reflux and regular exercise. Zantac may also help -  this is available over-the-counter.  We have ordered labs or studies at this visit. It can take up to 1-2 weeks for results and processing. IF results require follow up or explanation, we will call you with instructions. Clinically stable results will be released to your [facility]. If you have not heard from [place] or cannot find your results in [facility] in 2 weeks please contact our office at [phone].  If you are not yet signed up for [facility], please consider signing up.  We recommend the following healthy lifestyle for LIFE: 1) Small portions.   Tip: eat off of a salad plate instead of a dinner plate.  Tip: It is ok to feel hungry after a meal - that likely means you ate an appropriate portion.  Tip: if you need more or a snack choose fruits, veggies and/or a handful of nuts or seeds.  2) Eat a healthy clean diet.  * Tip: Avoid (  less then 1 serving per week): processed foods, sweets, sweetened drinks, white starches (rice, flour, bread, potatoes, pasta, etc), red meat, fast foods, butter  *Tip: CHOOSE instead   * 5-9 servings per day of fresh or frozen fruits and vegetables (but not corn, potatoes, bananas, canned or dried fruit)   *nuts and seeds, beans   *olives and olive oil   *small portions of lean meats such as fish and white chicken    *small portions of whole grains  3)Get at least 150 minutes of sweaty aerobic exercise per week.  Food Choices for Gastroesophageal Reflux Disease, Adult When you have gastroesophageal reflux disease (GERD), the foods you eat and your eating habits are very important. Choosing the right foods can help ease the discomfort of GERD. WHAT GENERAL GUIDELINES DO I NEED TO FOLLOW?  Choose fruits, vegetables, whole grains, low-fat dairy products, and low-fat meat, fish, and poultry.  Limit fats such as oils, salad dressings, butter, nuts, and avocado.  Keep a food diary to identify foods that cause symptoms.  Avoid foods that cause reflux. These may be  different for different people.  Eat frequent small meals instead of three large meals each day.  Eat your meals slowly, in a relaxed setting.  Limit fried foods.  Cook foods using methods other than frying.  Avoid drinking alcohol.  Avoid drinking large amounts of liquids with your meals.  Avoid bending over or lying down until 2-3 hours after eating. WHAT FOODS ARE NOT RECOMMENDED? The following are some foods and drinks that may worsen your symptoms: Vegetables Tomatoes. Tomato juice. Tomato and spaghetti sauce. Chili peppers. Onion and garlic. Horseradish. Fruits Oranges, grapefruit, and lemon (fruit and juice). Meats High-fat meats:  hot dogs, ribs, ham, sausage, salami, and bacon. Dairy Whole milk and chocolate milk. Sour cream. Cream. Butter. Ice cream. Cream cheese.  Beverages Coffee and tea, with or without caffeine. Carbonated beverages or energy drinks. Condiments Hot sauce. Barbecue sauce.  Sweets/Desserts Chocolate and cocoa. Donuts. Peppermint and spearmint. Fats and Oils High-fat foods, including [place] fries and potato chips. Other Strong spices, such as black pepper, white pepper, red pepper, cayenne, curry powder, cloves, ginger, and chili powder. The items listed above may not be a complete list of foods and beverages to avoid. Contact your dietitian for more information.   This information is not intended to replace advice given to you by your health care provider. Make sure you discuss any questions you have with your health care provider.   Document Released: 01/09/2005 Document Revised: 01/30/2014 Document Reviewed: 11/13/2012 Elsevier Interactive Patient Education 2016 [employer].   4)Reduce stress - consider counseling, meditation and relaxation to balance other aspects of your life.         No Follow-up on file.  Colin Benton R., DO

## 2015-11-09 NOTE — Progress Notes (Signed)
Pre visit review using our clinic review tool, if applicable. No additional management support is needed unless otherwise documented below in the visit note. 

## 2015-11-09 NOTE — Patient Instructions (Addendum)
BEFORE YOU LEAVE: -order cologuard -follow up: in one month regarding the acid reflux -labs  Try the diet for acid reflux and regular exercise. Zantac may also help - this is available over-the-counter.  We have ordered labs or studies at this visit. It can take up to 1-2 weeks for results and processing. IF results require follow up or explanation, we will call you with instructions. Clinically stable results will be released to your [facility]. If you have not heard from [place] or cannot find your results in [facility] in 2 weeks please contact our office at [phone].  If you are not yet signed up for [facility], please consider signing up.  We recommend the following healthy lifestyle for LIFE: 1) Small portions.   Tip: eat off of a salad plate instead of a dinner plate.  Tip: It is ok to feel hungry after a meal - that likely means you ate an appropriate portion.  Tip: if you need more or a snack choose fruits, veggies and/or a handful of nuts or seeds.  2) Eat a healthy clean diet.  * Tip: Avoid (less then 1 serving per week): processed foods, sweets, sweetened drinks, white starches (rice, flour, bread, potatoes, pasta, etc), red meat, fast foods, butter  *Tip: CHOOSE instead   * 5-9 servings per day of fresh or frozen fruits and vegetables (but not corn, potatoes, bananas, canned or dried fruit)   *nuts and seeds, beans   *olives and olive oil   *small portions of lean meats such as fish and white chicken    *small portions of whole grains  3)Get at least 150 minutes of sweaty aerobic exercise per week.  Food Choices for Gastroesophageal Reflux Disease, Adult When you have gastroesophageal reflux disease (GERD), the foods you eat and your eating habits are very important. Choosing the right foods can help ease the discomfort of GERD. WHAT GENERAL GUIDELINES DO I NEED TO FOLLOW?  Choose fruits, vegetables, whole grains, low-fat dairy products, and low-fat meat, fish, and poultry.  Limit  fats such as oils, salad dressings, butter, nuts, and avocado.  Keep a food diary to identify foods that cause symptoms.  Avoid foods that cause reflux. These may be different for different people.  Eat frequent small meals instead of three large meals each day.  Eat your meals slowly, in a relaxed setting.  Limit fried foods.  Cook foods using methods other than frying.  Avoid drinking alcohol.  Avoid drinking large amounts of liquids with your meals.  Avoid bending over or lying down until 2-3 hours after eating. WHAT FOODS ARE NOT RECOMMENDED? The following are some foods and drinks that may worsen your symptoms: Vegetables Tomatoes. Tomato juice. Tomato and spaghetti sauce. Chili peppers. Onion and garlic. Horseradish. Fruits Oranges, grapefruit, and lemon (fruit and juice). Meats High-fat meats:  hot dogs, ribs, ham, sausage, salami, and bacon. Dairy Whole milk and chocolate milk. Sour cream. Cream. Butter. Ice cream. Cream cheese.  Beverages Coffee and tea, with or without caffeine. Carbonated beverages or energy drinks. Condiments Hot sauce. Barbecue sauce.  Sweets/Desserts Chocolate and cocoa. Donuts. Peppermint and spearmint. Fats and Oils High-fat foods, including [place] fries and potato chips. Other Strong spices, such as black pepper, white pepper, red pepper, cayenne, curry powder, cloves, ginger, and chili powder. The items listed above may not be a complete list of foods and beverages to avoid. Contact your dietitian for more information.   This information is not intended to replace advice given to you  by your health care provider. Make sure you discuss any questions you have with your health care provider.   Document Released: 01/09/2005 Document Revised: 01/30/2014 Document Reviewed: 11/13/2012 Elsevier Interactive Patient Education 2016 [employer].   4)Reduce stress - consider counseling, meditation and relaxation to balance other aspects of your  life.

## 2015-11-15 ENCOUNTER — Ambulatory Visit (INDEPENDENT_AMBULATORY_CARE_PROVIDER_SITE_OTHER): Payer: BC Managed Care – PPO | Admitting: Family Medicine

## 2015-11-15 DIAGNOSIS — M25511 Pain in right shoulder: Secondary | ICD-10-CM

## 2015-11-15 NOTE — Progress Notes (Signed)
Acute visit for acute R shoulder pain. Computer systems down. See scanned documentation for details for visit.

## 2015-11-25 ENCOUNTER — Encounter: Payer: Self-pay | Admitting: Family Medicine

## 2015-12-06 NOTE — Progress Notes (Signed)
HPI:  Kendra Austin is a pleasant 50 year old here all of her issues. First of all, she reports her shoulder pain is completely resolved. It took about 10 days of doing the exercises. She had mild hyperglycemia and hyperlipidemia on her last set of labs and now [facility] 1 hour and feels great. She also is working on a healthier diet and weight reduction. She is taking Zantac for or acid reflux and also has noticed that several foods trigger her symptoms including coffee, red meet and onions. As long as she avoids these foods and takes her Zantac she feels great. She reports we had ordered a Cologuard test for her, but that she has not heard anything about these results. ROS: See pertinent positives and negatives per HPI.  Past Medical History:  Diagnosis Date  . Arthritis    osteoarthritis; neck, R shoulder  . Atypical chest pain    has had prior cardiac eval in the past  . Depression    related to stress from prior job; no medications, hospitalizations or SI  . DUB (dysfunctional uterine bleeding) 07/22/2015  . Essential tremor 07/22/2015  . Seasonal allergies 07/22/2015  . Vertigo    resolves with chiropratic; and sees Dr. Tamala Julian    Past Surgical History:  Procedure Laterality Date  . GANGLION CYST EXCISION     right wrist  . TONSILLECTOMY AND ADENOIDECTOMY  1971/1972    Family History  Problem Relation Age of Onset  . Diabetes Mother   . Heart disease Father   . Diabetes Father   . Macular degeneration Mother   . Macular degeneration Maternal Grandmother     Social History   Social History  . Marital status: Single    Spouse name: N/A  . Number of children: N/A  . Years of education: N/A   Social History Main Topics  . Smoking status: Never Smoker  . Smokeless tobacco: Never Used  . Alcohol use 0.0 oz/week  . Drug use: No  . Sexual activity: Not Asked   Other Topics Concern  . None   Social History Narrative    Work or School: Hydrologist; Soil scientist and pedagogy       Home Situation:       Spiritual Beliefs:       Lifestyle: no regular exercise; diet is pretty good - avoids gluten              Current Outpatient Prescriptions:  .  Cholecalciferol (VITAMIN D PO), Take by mouth., Disp: , Rfl:  .  CRYSELLE-28 0.3-30 MG-MCG tablet, Take 1 tablet by mouth daily., Disp: , Rfl: 12 .  fluticasone (FLONASE) 50 MCG/ACT nasal spray, Place 2 sprays into both nostrils daily., Disp: , Rfl:  .  LUTEIN PO, Take by mouth., Disp: , Rfl:  .  Multiple Vitamins-Minerals (MULTIVITAMIN ADULT PO), Take by mouth., Disp: , Rfl:  .  NON FORMULARY, Estrapause, Disp: , Rfl:  .  Omega-3 Fatty Acids (FISH OIL PO), Take by mouth., Disp: , Rfl:  .  ranitidine (ZANTAC) 75 MG tablet, Take 75 mg by mouth at bedtime., Disp: , Rfl:  .  TURMERIC PO, Take by mouth., Disp: , Rfl:   EXAM:  Vitals:   12/07/15 0919  BP: 118/64  Pulse: 75  Temp: 98.4 F (36.9 C)    Body mass index is 32.7 kg/m.  GENERAL: vitals reviewed and listed above, alert, oriented, appears well hydrated and  in no acute distress  HEENT: atraumatic, conjunttiva clear, no obvious abnormalities on inspection of external nose and ears  NECK: no obvious masses on inspection  LUNGS: clear to auscultation bilaterally, no wheezes, rales or rhonchi, good air movement  CV: HRRR, no peripheral edema  ABD: BS+, soft, NTTP  MS: moves all extremities without noticeable abnormality  PSYCH: pleasant and cooperative, no obvious depression or anxiety  ASSESSMENT AND PLAN:  Discussed the following assessment and plan:  Arthralgia of shoulder, unspecified laterality  Gastroesophageal reflux disease without esophagitis  Hyperglycemia  Hyperlipidemia, unspecified hyperlipidemia type  BMI 32.0-32.9,adult  -glad shoulder better and exercising - advised proper posture and backpack for her heavy work bag carried properly to avoid recurrent  symptoms -encouraged continued lifestyle changes -plan to recheck labs at next appointment -Advised assisted to check into Cologuard screening and reorder if needed -Follow up in 3 months, or sooner if any concerns  Patient Instructions  BEFORE YOU LEAVE: -please check on and reorder cologuard if needed -follow up: 3-4 months  Continue a healthy diet and regular exercise. We will plan to recheck her labs at your next follow-up appointment.    We recommend the following healthy lifestyle for LIFE: 1) Small portions.   Tip: eat off of a salad plate instead of a dinner plate.  Tip: It is ok to feel hungry after a meal after a meal of proper portion sizes.  Tip: if you need more or a snack choose fruits, veggies and/or a handful of nuts or seeds.  2) Eat a healthy clean diet.  * Tip: Avoid (less then 1 serving per week): processed foods, sweets, sweetened drinks, white starches (rice, flour, bread, potatoes, pasta, etc), red meat, fast foods, butter  *Tip: CHOOSE instead   * 5-9 servings per day of fresh or frozen fruits and vegetables (but not corn, potatoes, bananas, canned or dried fruit)   *nuts and seeds, beans   *olives and olive oil   *small portions of lean meats such as fish and white chicken    *small portions of whole grains  3)Get at least 150 minutes of sweaty aerobic exercise per week.  4)Reduce stress - consider counseling, meditation and relaxation to balance other aspects of your life.      Colin Benton R., DO

## 2015-12-07 ENCOUNTER — Ambulatory Visit (INDEPENDENT_AMBULATORY_CARE_PROVIDER_SITE_OTHER): Payer: BC Managed Care – PPO | Admitting: Family Medicine

## 2015-12-07 ENCOUNTER — Encounter: Payer: Self-pay | Admitting: Family Medicine

## 2015-12-07 VITALS — BP 118/64 | HR 75 | Temp 98.4°F | Ht 65.0 in | Wt 196.5 lb

## 2015-12-07 DIAGNOSIS — R739 Hyperglycemia, unspecified: Secondary | ICD-10-CM | POA: Diagnosis not present

## 2015-12-07 DIAGNOSIS — M25519 Pain in unspecified shoulder: Secondary | ICD-10-CM | POA: Diagnosis not present

## 2015-12-07 DIAGNOSIS — K219 Gastro-esophageal reflux disease without esophagitis: Secondary | ICD-10-CM

## 2015-12-07 DIAGNOSIS — Z6832 Body mass index (BMI) 32.0-32.9, adult: Secondary | ICD-10-CM

## 2015-12-07 DIAGNOSIS — E785 Hyperlipidemia, unspecified: Secondary | ICD-10-CM | POA: Diagnosis not present

## 2015-12-07 NOTE — Patient Instructions (Addendum)
BEFORE YOU LEAVE: -please check on and reorder cologuard if needed -follow up: 3-4 months  Continue a healthy diet and regular exercise. We will plan to recheck her labs at your next follow-up appointment.    We recommend the following healthy lifestyle for LIFE: 1) Small portions.   Tip: eat off of a salad plate instead of a dinner plate.  Tip: It is ok to feel hungry after a meal after a meal of proper portion sizes.  Tip: if you need more or a snack choose fruits, veggies and/or a handful of nuts or seeds.  2) Eat a healthy clean diet.  * Tip: Avoid (less then 1 serving per week): processed foods, sweets, sweetened drinks, white starches (rice, flour, bread, potatoes, pasta, etc), red meat, fast foods, butter  *Tip: CHOOSE instead   * 5-9 servings per day of fresh or frozen fruits and vegetables (but not corn, potatoes, bananas, canned or dried fruit)   *nuts and seeds, beans   *olives and olive oil   *small portions of lean meats such as fish and white chicken    *small portions of whole grains  3)Get at least 150 minutes of sweaty aerobic exercise per week.  4)Reduce stress - consider counseling, meditation and relaxation to balance other aspects of your life.

## 2015-12-07 NOTE — Progress Notes (Signed)
Pre visit review using our clinic review tool, if applicable. No additional management support is needed unless otherwise documented below in the visit note. 

## 2015-12-18 NOTE — Progress Notes (Signed)
[name] [address] [place], [place] [address] Phone: [phone] Subjective:     CC: Neck and lower back pain follow up  [id number]  Kendra Austin is a 50 y.o. female coming in with complaint of neck and back pain. Patient was last seen greater than 6 weeks ago. Patient states She is been doing fairly well but has some increase stress at work her sleep. Patient discusses it told throbbing aching pain mostly in the neck and not as much as the lower back. Patient has been trying to workout but not on as much is a regular basis. Patient continues to stay active. Patient denies any radiation down the arms. Feels that her shoulders better when she is working on a regular basis. No new symptoms. Just worsening of previous symptoms.      Past Medical History:  Diagnosis Date  . Arthritis    osteoarthritis; neck, R shoulder  . Atypical chest pain    has had prior cardiac eval in the past  . Depression    related to stress from prior job; no medications, hospitalizations or SI  . DUB (dysfunctional uterine bleeding) 07/22/2015  . Essential tremor 07/22/2015  . Seasonal allergies 07/22/2015  . Vertigo    resolves with chiropratic; and sees Dr. Tamala Julian   Past Surgical History:  Procedure Laterality Date  . GANGLION CYST EXCISION     right wrist  . TONSILLECTOMY AND ADENOIDECTOMY  1971/1972   Social History  Substance Use Topics  . Smoking status: Never Smoker  . Smokeless tobacco: Never Used  . Alcohol use 0.0 oz/week   Allergies  Allergen Reactions  . Aleve [Naproxen Sodium]     Heart palpitations - can take ibuprofen  . Flexeril [Cyclobenzaprine] Other (See Comments)    Makes her extremely tired  . Meloxicam     Made her depressed   Family History  Problem Relation Age of Onset  . Diabetes Mother   . Heart disease Father   . Diabetes Father   . Macular degeneration Mother   . Macular degeneration Maternal Grandmother      Past  medical history, social, surgical and family history all reviewed in electronic medical record.   Review of Systems: No headache, visual changes, nausea, vomiting, diarrhea, constipation, dizziness, abdominal pain, skin rash, fevers, chills, night sweats, weight loss, swollen lymph nodes,  chest pain, shortness of breath, mood changes.   Objective  Blood pressure 110/80, pulse 76, height 5\' 5"  (1.651 m), weight 198 lb (89.8 kg), SpO2 97 %.  Systems examined below as of 12/21/15 General: NAD A&O x3 mood, affect normal  HEENT: Pupils equal, extraocular movements intact no nystagmus Respiratory: not short of breath at rest or with speaking Cardiovascular: No lower extremity edema, non tender Skin: Warm dry intact with no signs of infection or rash on extremities or on axial skeleton. Abdomen: Soft nontender, no masses Neuro: Cranial nerves  intact, neurovascularly intact in all extremities with 2+ DTRs and 2+ pulses. Lymph: No lymphadenopathy appreciated today  Gait normal with good balance and coordination.  MSK: Non tender with full range of motion and good stability and symmetric strength and tone of shoulders, elbows, wrist,  knee hips and ankles bilaterally.    Neck: Inspection unremarkable.   No palpable stepoffs. Negative Spurling's maneuver. Increased tenderness of the paraspinal musculature of the cervical. Grip strength and sensation normal in bilateral hands Strength good C4 to T1 distribution No sensory change to C4 to  T1 Negative Hoffman sign bilaterally Reflexes normal Back Exam:  Inspection: Unremarkable  Motion: Flexion 40 deg, Extension 25 deg, Side Bending to 35 deg bilaterally,  Rotation to 35 deg bilaterally stable from previous exam SLR laying: Negative  XSLR laying: Negative  Palpable tenderness: Mild increasing tenderness of the paraspinal musculature of the thoracolumbar juncture Sensory change: Gross sensation intact to all lumbar and sacral dermatomes.    Reflexes: 2+ at both patellar tendons, 2+ at achilles tendons, Babinski's downgoing.  Strength at foot  Plantar-flexion: 5/5 Dorsi-flexion: 5/5 Eversion: 5/5 Inversion: 5/5  Leg strength  Quad: 5/5 Hamstring: /5 Hip flexor: 5/5 Hip abductors: 5 out of 5 Gait unremarkable. Leg length discrepancy on right shorter     OMT Physical Exam  Cervical  C2 flexed rotated and side bent left C5 flexed rotated and side bent left  Thoracic T4 extended rotated and side bent right T8 extended rotated and side bent left  Lumbar L3 flexed rotated and side bent left  Sacrum Right on right   Impression and Recommendations:     This case required medical decision making of moderate complexity.

## 2015-12-21 ENCOUNTER — Encounter: Payer: Self-pay | Admitting: Family Medicine

## 2015-12-21 ENCOUNTER — Ambulatory Visit (INDEPENDENT_AMBULATORY_CARE_PROVIDER_SITE_OTHER): Payer: BC Managed Care – PPO | Admitting: Family Medicine

## 2015-12-21 VITALS — BP 110/80 | HR 76 | Ht 65.0 in | Wt 198.0 lb

## 2015-12-21 DIAGNOSIS — R293 Abnormal posture: Secondary | ICD-10-CM

## 2015-12-21 DIAGNOSIS — M999 Biomechanical lesion, unspecified: Secondary | ICD-10-CM

## 2015-12-21 NOTE — Assessment & Plan Note (Signed)
Discussed with patient again about possible changes and ergonomics. I do believe that stress plays a significant role. Patient will continue to be active otherwise. We discussed icing regimen and home exercises. We discussed which activities doing which ones to avoid. Patient will continue to be active. Follow-up with me again in 6-8 weeks

## 2015-12-21 NOTE — Patient Instructions (Addendum)
Great to see you  Happy holidays!  Stay active.  Do not change much at all I think you are doing well.  Have a great trip  See me again in 6-8 weeks.

## 2015-12-21 NOTE — Assessment & Plan Note (Signed)
Decision today to treat with OMT was based on Physical Exam  After verbal consent patient was treated with HVLA, ME, FPR techniques in cervical, thoracic and lumbar areas  Patient tolerated the procedure well with improvement in symptoms  Patient given exercises, stretches and lifestyle modifications  See medications in patient instructions if given  Patient will follow up in 6-8 weeks                   

## 2016-02-06 NOTE — Progress Notes (Signed)
[name] [place] [address] [place], [place] [address] Phone: [phone] Subjective:     CC: Neck and lower back pain follow up  [id number]  Kendra Austin is a 51 y.o. female coming in with complaint of neck and back pain.  Patient has responded fairly well to osteopathic manipulation previously. Patient was to start increasing activity. Patient states that she is feeling much better overall. Having significant improvement overall Neck seems to be doing well.        Past Medical History:  Diagnosis Date  . Arthritis    osteoarthritis; neck, R shoulder  . Atypical chest pain    has had prior cardiac eval in the past  . Depression    related to stress from prior job; no medications, hospitalizations or SI  . DUB (dysfunctional uterine bleeding) 07/22/2015  . Essential tremor 07/22/2015  . Seasonal allergies 07/22/2015  . Vertigo    resolves with chiropratic; and sees Dr. Tamala Julian   Past Surgical History:  Procedure Laterality Date  . GANGLION CYST EXCISION     right wrist  . TONSILLECTOMY AND ADENOIDECTOMY  1971/1972   Social History  Substance Use Topics  . Smoking status: Never Smoker  . Smokeless tobacco: Never Used  . Alcohol use 0.0 oz/week   Allergies  Allergen Reactions  . Aleve [Naproxen Sodium]     Heart palpitations - can take ibuprofen  . Flexeril [Cyclobenzaprine] Other (See Comments)    Makes her extremely tired  . Meloxicam     Made her depressed   Family History  Problem Relation Age of Onset  . Diabetes Mother   . Heart disease Father   . Diabetes Father   . Macular degeneration Mother   . Macular degeneration Maternal Grandmother      Past medical history, social, surgical and family history all reviewed in electronic medical record.   Review of Systems: No headache, visual changes, nausea, vomiting, diarrhea, constipation, dizziness, abdominal pain, skin rash, fevers, chills, night sweats, weight loss, swollen  lymph nodes, body aches, joint swelling, chest pain, shortness of breath, mood changes.     Objective  Blood pressure 123/80, pulse 82, height 5\' 5"  (1.651 m), weight 196 lb 6.4 oz (89.1 kg).  Systems examined below as of 02/07/16 General: NAD A&O x3 mood, affect normal  HEENT: Pupils equal, extraocular movements intact no nystagmus Respiratory: not short of breath at rest or with speaking Cardiovascular: No lower extremity edema, non tender Skin: Warm dry intact with no signs of infection or rash on extremities or on axial skeleton. Abdomen: Soft nontender, no masses Neuro: Cranial nerves  intact, neurovascularly intact in all extremities with 2+ DTRs and 2+ pulses. Lymph: No lymphadenopathy appreciated today  Gait normal with good balance and coordination.  MSK: Non tender with full range of motion and good stability and symmetric strength and tone of shoulders, elbows, wrist,  knee hips and ankles bilaterally.      Neck: Inspection unremarkable. No palpable stepoffs. Negative Spurling's maneuver. Full neck range of motion Grip strength and sensation normal in bilateral hands Strength good C4 to T1 distribution No sensory change to C4 to T1 Negative Hoffman sign bilaterally Reflexes normal  Back Exam:  Inspection: Unremarkable  Motion: Flexion 45 deg, Extension 45 deg, Side Bending to 45 deg bilaterally,  Rotation to 45 deg bilaterally  SLR laying: Negative  XSLR laying: Negative  Palpable tenderness: Tender to palpation and appears palmar musculature of the lumbar spine. FABER:  negative. Sensory change: Gross sensation intact to all lumbar and sacral dermatomes.  Reflexes: 2+ at both patellar tendons, 2+ at achilles tendons, Babinski's downgoing.  Strength at foot  Plantar-flexion: 5/5 Dorsi-flexion: 5/5 Eversion: 5/5 Inversion: 5/5  Leg strength  Quad: 5/5 Hamstring: 5/5 Hip flexor: 5/5 Hip abductors: 5/5  Gait unremarkable.      OMT Physical Exam  Cervical  C2  flexed rotated and side bent left C4 flexed rotated and side bent left  Thoracic T4 extended rotated and side bent right T7 extended rotated and side bent left  Lumbar L2 flexed rotated and side bent left  Sacrum Right on right   Impression and Recommendations:     This case required medical decision making of moderate complexity.

## 2016-02-07 ENCOUNTER — Encounter: Payer: Self-pay | Admitting: Family Medicine

## 2016-02-07 ENCOUNTER — Ambulatory Visit (INDEPENDENT_AMBULATORY_CARE_PROVIDER_SITE_OTHER): Payer: BC Managed Care – PPO | Admitting: Family Medicine

## 2016-02-07 VITALS — BP 123/80 | HR 82 | Ht 65.0 in | Wt 196.4 lb

## 2016-02-07 DIAGNOSIS — M999 Biomechanical lesion, unspecified: Secondary | ICD-10-CM | POA: Diagnosis not present

## 2016-02-07 DIAGNOSIS — R293 Abnormal posture: Secondary | ICD-10-CM | POA: Diagnosis not present

## 2016-02-07 NOTE — Assessment & Plan Note (Signed)
Doing much better overall. I do believe that some of the pain is sacral somatic and patient seems to be in a better placed recently. Encourage her to continue to work on core strength, posture and ergonomics. Follow-up again in 2 months.

## 2016-02-07 NOTE — Patient Instructions (Signed)
You are doing awesome.  Ice is your friend.  Stay active.  Keep doing what you are doing See me again in 2 months!

## 2016-02-07 NOTE — Assessment & Plan Note (Signed)
Decision today to treat with OMT was based on Physical Exam  After verbal consent patient was treated with HVLA, ME, FPR techniques in cervical, thoracic and lumbar  areas  Patient tolerated the procedure well with improvement in symptoms  Patient given exercises, stretches and lifestyle modifications  See medications in patient instructions if given  Patient will follow up in 8 weeks.

## 2016-03-26 NOTE — Progress Notes (Signed)
HPI:  Kendra Austin is a pleasant 51 y.o. here for follow up. Chronic medical problems summarized below were reviewed for changes and stability and were updated as needed below. These issues and their treatment remain stable for the most part. She has not made significant lifestlye changes. Eating a little better on wt watchers plan but not sticking to the plan strictly. No regular exercise but plans to get bike out. Prefers to do labs next visit after further changes. Reports insurance would NOT cover the cologard but will cover a colonoscopy. Denies CP, SOB, DOE, treatment intolerance or new symptoms.  Hyperglycemia and HLD: -lifestyle recommendations advised  GERD: -reports chronic, intermittent, 2ndary to overeating -reports eval with GI and EGD about in 2016 ok -took ppi in the past but it made her anxious -trying to avoid triggers and take zantac at times  Essential Tremor: - chronic, everyone in her family has this -action tremor, not interfering with ativities  DUB: -saw gyn in the past, on OCPs -now seeing gyn here and reports had her gyn exam  ROS: See pertinent positives and negatives per HPI.  Past Medical History:  Diagnosis Date  . Arthritis    osteoarthritis; neck, R shoulder  . Atypical chest pain    has had prior cardiac eval in the past  . Depression    related to stress from prior job; no medications, hospitalizations or SI  . DUB (dysfunctional uterine bleeding) 07/22/2015  . Essential tremor 07/22/2015  . Seasonal allergies 07/22/2015  . Vertigo    resolves with chiropratic; and sees Dr. Tamala Julian    Past Surgical History:  Procedure Laterality Date  . GANGLION CYST EXCISION     right wrist  . TONSILLECTOMY AND ADENOIDECTOMY  1971/1972    Family History  Problem Relation Age of Onset  . Diabetes Mother   . Heart disease Father   . Diabetes Father   . Macular degeneration Mother   . Macular degeneration Maternal Grandmother     Social History    Social History  . Marital status: Single    Spouse name: N/A  . Number of children: N/A  . Years of education: N/A   Social History Main Topics  . Smoking status: Never Smoker  . Smokeless tobacco: Never Used  . Alcohol use 0.0 oz/week  . Drug use: No  . Sexual activity: Not Asked   Other Topics Concern  . None   Social History Narrative   Work or School: Hydrologist; Soil scientist and pedagogy       Home Situation:       Spiritual Beliefs:       Lifestyle: no regular exercise; diet is pretty good - avoids gluten              Current Outpatient Prescriptions:  .  Cholecalciferol (VITAMIN D PO), Take by mouth., Disp: , Rfl:  .  CRYSELLE-28 0.3-30 MG-MCG tablet, Take 1 tablet by mouth daily., Disp: , Rfl: 12 .  fluticasone (FLONASE) 50 MCG/ACT nasal spray, Place 2 sprays into both nostrils daily., Disp: , Rfl:  .  LUTEIN PO, Take by mouth., Disp: , Rfl:  .  Multiple Vitamins-Minerals (MULTIVITAMIN ADULT PO), Take by mouth., Disp: , Rfl:  .  NON FORMULARY, Estrapause, Disp: , Rfl:  .  Omega-3 Fatty Acids (FISH OIL PO), Take by mouth., Disp: , Rfl:  .  ranitidine (ZANTAC) 75 MG tablet, Take 75 mg by mouth at bedtime., Disp: , Rfl:  .  TURMERIC PO,  Take by mouth., Disp: , Rfl:   EXAM:  Vitals:   03/27/16 0912  BP: 120/70  Pulse: 79  Temp: 98.1 F (36.7 C)    Body mass index is 32.28 kg/m.  GENERAL: vitals reviewed and listed above, alert, oriented, appears well hydrated and in no acute distress  HEENT: atraumatic, conjunttiva clear, no obvious abnormalities on inspection of external nose and ears  NECK: no obvious masses on inspection  LUNGS: clear to auscultation bilaterally, no wheezes, rales or rhonchi, good air movement  CV: HRRR, no peripheral edema  MS: moves all extremities without noticeable abnormality  PSYCH: pleasant and cooperative, no obvious depression or anxiety  ASSESSMENT AND PLAN:  Discussed the following assessment and  plan:  Hyperglycemia  Hyperlipidemia, unspecified hyperlipidemia type  BMI 32.0-32.9,adult  Colon cancer screening - Plan: Ambulatory referral to Gastroenterology  -strongly encouraged and advised healthy diet, small portions and regular exercise -labs next visit -referral for colon cancer screening -Patient advised to return or notify a doctor immediately if symptoms worsen or persist or new concerns arise.  Patient Instructions  BEFORE YOU LEAVE: -follow up: 3-4 months; plan on fasting labs at that appointment  -We placed a referral for you as discussed for the colonosocpy. It usually takes about 1-2 weeks to process and schedule this referral. If you have not heard from [place] regarding this appointment in 2 weeks please contact our office.   We recommend the following healthy lifestyle for LIFE: 1) Small portions.   Tip: eat off of a salad plate instead of a dinner plate.  Tip: It is ok to feel hungry after a meal of proper portion sizes.  Tip: if you need more or a snack choose fruits, veggies and/or a handful of nuts or seeds.  2) Eat a healthy clean diet.  * Tip: Avoid (less then 1 serving per week): processed foods, sweets, sweetened drinks, white starches (rice, flour, bread, potatoes, pasta, etc), red meat, fast foods, butter  *Tip: CHOOSE instead   * 5-9 servings per day of fresh or frozen fruits and vegetables (but not corn, potatoes, bananas, canned or dried fruit)   *nuts and seeds, beans   *olives and olive oil   *small portions of lean meats such as fish and white chicken    *small portions of whole grains  3)Get at least 150 minutes of sweaty aerobic exercise per week.  4)Reduce stress - consider counseling, meditation and relaxation to balance other aspects of your life.  WE NOW OFFER   [facility] FAST TRACK!!!  SAME DAY Appointments for ACUTE CARE  Such as: Sprains, Injuries, cuts, abrasions, rashes, muscle pain, joint pain, back pain Colds,  flu, sore throats, headache, allergies, cough, fever  Ear pain, sinus and eye infections Abdominal pain, nausea, vomiting, diarrhea, upset stomach Animal/insect bites  3 Easy Ways to Schedule: Walk-In Scheduling Call in scheduling Mychart Sign-up: [email or web address]              Colin Benton R., DO

## 2016-03-27 ENCOUNTER — Encounter: Payer: Self-pay | Admitting: Family Medicine

## 2016-03-27 ENCOUNTER — Ambulatory Visit (INDEPENDENT_AMBULATORY_CARE_PROVIDER_SITE_OTHER): Payer: BC Managed Care – PPO | Admitting: Family Medicine

## 2016-03-27 ENCOUNTER — Encounter: Payer: Self-pay | Admitting: Gastroenterology

## 2016-03-27 VITALS — BP 120/70 | HR 79 | Temp 98.1°F | Ht 65.0 in | Wt 194.0 lb

## 2016-03-27 DIAGNOSIS — E1169 Type 2 diabetes mellitus with other specified complication: Secondary | ICD-10-CM | POA: Insufficient documentation

## 2016-03-27 DIAGNOSIS — E785 Hyperlipidemia, unspecified: Secondary | ICD-10-CM

## 2016-03-27 DIAGNOSIS — Z6832 Body mass index (BMI) 32.0-32.9, adult: Secondary | ICD-10-CM | POA: Diagnosis not present

## 2016-03-27 DIAGNOSIS — Z1211 Encounter for screening for malignant neoplasm of colon: Secondary | ICD-10-CM | POA: Diagnosis not present

## 2016-03-27 DIAGNOSIS — R739 Hyperglycemia, unspecified: Secondary | ICD-10-CM

## 2016-03-27 NOTE — Patient Instructions (Addendum)
BEFORE YOU LEAVE: -follow up: 3-4 months; plan on fasting labs at that appointment  -We placed a referral for you as discussed for the colonosocpy. It usually takes about 1-2 weeks to process and schedule this referral. If you have not heard from [place] regarding this appointment in 2 weeks please contact our office.   We recommend the following healthy lifestyle for LIFE: 1) Small portions.   Tip: eat off of a salad plate instead of a dinner plate.  Tip: It is ok to feel hungry after a meal of proper portion sizes.  Tip: if you need more or a snack choose fruits, veggies and/or a handful of nuts or seeds.  2) Eat a healthy clean diet.  * Tip: Avoid (less then 1 serving per week): processed foods, sweets, sweetened drinks, white starches (rice, flour, bread, potatoes, pasta, etc), red meat, fast foods, butter  *Tip: CHOOSE instead   * 5-9 servings per day of fresh or frozen fruits and vegetables (but not corn, potatoes, bananas, canned or dried fruit)   *nuts and seeds, beans   *olives and olive oil   *small portions of lean meats such as fish and white chicken    *small portions of whole grains  3)Get at least 150 minutes of sweaty aerobic exercise per week.  4)Reduce stress - consider counseling, meditation and relaxation to balance other aspects of your life.  WE NOW OFFER   [facility] FAST TRACK!!!  SAME DAY Appointments for ACUTE CARE  Such as: Sprains, Injuries, cuts, abrasions, rashes, muscle pain, joint pain, back pain Colds, flu, sore throats, headache, allergies, cough, fever  Ear pain, sinus and eye infections Abdominal pain, nausea, vomiting, diarrhea, upset stomach Animal/insect bites  3 Easy Ways to Schedule: Walk-In Scheduling Call in scheduling Mychart Sign-up: [email or web address]

## 2016-03-27 NOTE — Progress Notes (Signed)
Pre visit review using our clinic review tool, if applicable. No additional management support is needed unless otherwise documented below in the visit note. 

## 2016-04-07 ENCOUNTER — Ambulatory Visit (INDEPENDENT_AMBULATORY_CARE_PROVIDER_SITE_OTHER): Payer: BC Managed Care – PPO | Admitting: Family Medicine

## 2016-04-07 ENCOUNTER — Encounter: Payer: Self-pay | Admitting: Family Medicine

## 2016-04-07 VITALS — BP 122/78 | HR 92 | Ht 65.0 in | Wt 192.8 lb

## 2016-04-07 DIAGNOSIS — R293 Abnormal posture: Secondary | ICD-10-CM

## 2016-04-07 DIAGNOSIS — M999 Biomechanical lesion, unspecified: Secondary | ICD-10-CM

## 2016-04-07 NOTE — Patient Instructions (Signed)
Good to see you  You are doing great overall  Zinc 30mg  daily could help  See me again in 9-10 weeks.

## 2016-04-07 NOTE — Assessment & Plan Note (Signed)
Still seems to be mostly ergonomics than anything nails. Patient continues to have the same muscle imbalances. Patient has started to increase her swimming which I think will be beneficial. Patient will start to increase activity slowly. Do not feel that patient is doing anything more drastic any significant changes at this time. We'll continue to see patient every 8-10 weeks.

## 2016-04-07 NOTE — Assessment & Plan Note (Signed)
Decision today to treat with OMT was based on Physical Exam  After verbal consent patient was treated with HVLA, ME, FPR techniques in cervical, thoracic, lumbar and sacral areas  Patient tolerated the procedure well with improvement in symptoms  Patient given exercises, stretches and lifestyle modifications  See medications in patient instructions if given  Patient will follow up in 8-10 weeks 

## 2016-04-07 NOTE — Progress Notes (Signed)
[name] [address] [place], [place] [address] Phone: [phone] Subjective:     CC: Neck and lower back pain follow up  [id number]  Kendra Austin is a 51 y.o. female coming in with complaint of neck and back pain.  Patient is doing very well overall. Patient does have an upper respiratory infection and feels like it is contributing to some of the aching pain. Mostly in the neck in the upper back. Patient is also noticed that when she works on a more regular basis she has more pain as well. When patient is on vacation she seems to do well as well as when she works out she does better too. No radicular symptoms.       Past Medical History:  Diagnosis Date  . Arthritis    osteoarthritis; neck, R shoulder  . Atypical chest pain    has had prior cardiac eval in the past  . Depression    related to stress from prior job; no medications, hospitalizations or SI  . DUB (dysfunctional uterine bleeding) 07/22/2015  . Essential tremor 07/22/2015  . Seasonal allergies 07/22/2015  . Vertigo    resolves with chiropratic; and sees Dr. Tamala Julian   Past Surgical History:  Procedure Laterality Date  . GANGLION CYST EXCISION     right wrist  . TONSILLECTOMY AND ADENOIDECTOMY  1971/1972   Social History  Substance Use Topics  . Smoking status: Never Smoker  . Smokeless tobacco: Never Used  . Alcohol use 0.0 oz/week   Allergies  Allergen Reactions  . Aleve [Naproxen Sodium]     Heart palpitations - can take ibuprofen  . Flexeril [Cyclobenzaprine] Other (See Comments)    Makes her extremely tired  . Meloxicam     Made her depressed   Family History  Problem Relation Age of Onset  . Diabetes Mother   . Macular degeneration Mother   . Heart disease Father   . Diabetes Father   . Macular degeneration Maternal Grandmother      Past medical history, social, surgical and family history all reviewed in electronic medical record.   Review of Systems:  No , visual changes, nausea, vomiting, diarrhea, constipation, dizziness, abdominal pain, skin rash, fevers, chills, night sweats, weight loss, swollen lymph nodes, body aches, joint swelling, muscle aches, chest pain, shortness of breath, mood changes.   Positive for headaches    Objective  Blood pressure 122/78, pulse 92, height 5\' 5"  (1.651 m), weight 192 lb 12.8 oz (87.5 kg), SpO2 98 %.  Systems examined below as of 04/07/16 General: NAD A&O x3 mood, affect normal  HEENT: Pupils equal, extraocular movements intact no nystagmus Respiratory: not short of breath at rest or with speaking Cardiovascular: No lower extremity edema, non tender Skin: Warm dry intact with no signs of infection or rash on extremities or on axial skeleton. Abdomen: Soft nontender, no masses Neuro: Cranial nerves  intact, neurovascularly intact in all extremities with 2+ DTRs and 2+ pulses. Lymph: No lymphadenopathy appreciated today  Gait normal with good balance and coordination.  MSK: Non tender with full range of motion and good stability and symmetric strength and tone of shoulders, elbows, wrist,  knee hips and ankles bilaterally.     Neck: Inspection mild increasing kyphosis of the upper thoracic spine. No palpable stepoffs. Negative Spurling's maneuver. Mild limitation of the last 5 of extension Grip strength and sensation normal in bilateral hands Strength good C4 to T1 distribution No sensory change  to C4 to T1 Negative Hoffman sign bilaterally Reflexes normal  Back Exam:  Inspection: Unremarkable  Motion: Flexion 45 deg, Extension 25 deg, Side Bending to 45 deg bilaterally,  Rotation to 45 deg bilaterally  SLR laying: Negative  XSLR laying: Negative  Palpable tenderness: Mild increasing tightness in the hip flexors bilaterally record of left. FABER: Tightness bilaterally. Sensory change: Gross sensation intact to all lumbar and sacral dermatomes.  Reflexes: 2+ at both patellar tendons, 2+ at  achilles tendons, Babinski's downgoing.  Strength at foot  Plantar-flexion: 5/5 Dorsi-flexion: 5/5 Eversion: 5/5 Inversion: 5/5  Leg strength  Quad: 5/5 Hamstring: 5/5 Hip flexor: 5/5 Hip abductors: 5/5  Gait unremarkable.   Osteopathic findings Cervical C2 flexed rotated and side bent right C5 flexed rotated and side bent right T3 extended rotated and side bent right inhaled third rib T9 extended rotated and side bent left L3 flexed rotated and side bent right Sacrum left on left    Impression and Recommendations:     This case required medical decision making of moderate complexity.

## 2016-04-10 ENCOUNTER — Encounter: Payer: Self-pay | Admitting: Family Medicine

## 2016-04-10 ENCOUNTER — Ambulatory Visit (INDEPENDENT_AMBULATORY_CARE_PROVIDER_SITE_OTHER): Payer: BC Managed Care – PPO | Admitting: Family Medicine

## 2016-04-10 VITALS — BP 118/70 | HR 92 | Temp 98.8°F | Ht 65.0 in | Wt 194.5 lb

## 2016-04-10 DIAGNOSIS — J988 Other specified respiratory disorders: Secondary | ICD-10-CM

## 2016-04-10 MED ORDER — DOXYCYCLINE HYCLATE 100 MG PO TABS: 100.0000 mg | ORAL_TABLET | Freq: Two times a day (BID) | ORAL | 0 refills | Status: DC

## 2016-04-10 MED ORDER — BENZONATATE 100 MG PO CAPS: 100.0000 mg | ORAL_CAPSULE | Freq: Two times a day (BID) | ORAL | 0 refills | Status: DC | PRN

## 2016-04-10 NOTE — Progress Notes (Signed)
HPI:  "? Sinus infection" -started: 7-8 days ago -symptoms:nasal congestion, sore throat, cough,PND, sinus pressure - R max sinus pain at times, ear pressure -denies:fever, SOB, NVD, tooth pain, body aches -has tried: flonase -sick contacts/travel/risks: no reported flu, strep or tick exposure - every one at work with similar symptoms -Hx of: allergies  ROS: See pertinent positives and negatives per HPI.  Past Medical History:  Diagnosis Date  . Arthritis    osteoarthritis; neck, R shoulder  . Atypical chest pain    has had prior cardiac eval in the past  . Depression    related to stress from prior job; no medications, hospitalizations or SI  . DUB (dysfunctional uterine bleeding) 07/22/2015  . Essential tremor 07/22/2015  . Seasonal allergies 07/22/2015  . Vertigo    resolves with chiropratic; and sees Dr. Tamala Julian    Past Surgical History:  Procedure Laterality Date  . GANGLION CYST EXCISION     right wrist  . TONSILLECTOMY AND ADENOIDECTOMY  1971/1972    Family History  Problem Relation Age of Onset  . Diabetes Mother   . Macular degeneration Mother   . Heart disease Father   . Diabetes Father   . Macular degeneration Maternal Grandmother     Social History   Social History  . Marital status: Single    Spouse name: N/A  . Number of children: N/A  . Years of education: N/A   Social History Main Topics  . Smoking status: Never Smoker  . Smokeless tobacco: Never Used  . Alcohol use 0.0 oz/week  . Drug use: No  . Sexual activity: Not Asked   Other Topics Concern  . None   Social History Narrative   Work or School: Hydrologist; [employer]il scientist and pedagogy       Home Situation:       Spiritual Beliefs:       Lifestyle: no regular exercise; diet is pretty good - avoids gluten              Current Outpatient Prescriptions:  .  Cholecalciferol (VITAMIN D PO), Take by mouth., Disp: , Rfl:  .  CRYSELLE-28 0.3-30 MG-MCG tablet, Take 1 tablet by mouth  daily., Disp: , Rfl: 12 .  fluticasone (FLONASE) 50 MCG/ACT nasal spray, Place 2 sprays into both nostrils daily., Disp: , Rfl:  .  LUTEIN PO, Take by mouth., Disp: , Rfl:  .  Multiple Vitamins-Minerals (MULTIVITAMIN ADULT PO), Take by mouth., Disp: , Rfl:  .  NON FORMULARY, Estrapause, Disp: , Rfl:  .  Omega-3 Fatty Acids (FISH OIL PO), Take by mouth., Disp: , Rfl:  .  ranitidine (ZANTAC) 75 MG tablet, Take 75 mg by mouth at bedtime., Disp: , Rfl:  .  TURMERIC PO, Take by mouth., Disp: , Rfl:  .  benzonatate (TESSALON) 100 MG capsule, Take 1 capsule (100 mg total) by mouth 2 (two) times daily as needed for cough., Disp: 20 capsule, Rfl: 0 .  doxycycline (VIBRA-TABS) 100 MG tablet, Take 1 tablet (100 mg total) by mouth 2 (two) times daily., Disp: 20 tablet, Rfl: 0  EXAM:  Vitals:   04/10/16 1032  BP: 118/70  Pulse: 92  Temp: 98.8 F (37.1 C)    Body mass index is 32.37 kg/m.  GENERAL: vitals reviewed and listed above, alert, oriented, appears well hydrated and in no acute distress  HEENT: atraumatic, conjunttiva clear, no obvious abnormalities on inspection of external nose and ears, normal appearance of ear canals and TMs,  clear nasal congestion, mild post oropharyngeal erythema with PND, no tonsillar edema or exudate, no sinus TTP  NECK: no obvious masses on inspection  LUNGS: clear to auscultation bilaterally, no wheezes, rales or rhonchi, good air movement  CV: HRRR, no peripheral edema  MS: moves all extremities without noticeable abnormality  PSYCH: pleasant and cooperative, no obvious depression or anxiety  ASSESSMENT AND PLAN:  Discussed the following assessment and plan:  Respiratory infection  -given HPI and exam findings today, a serious infection or illness is unlikely. We discussed potential etiologies, with VURI being most likely, potentially developing sinusitis. Worst symptom is the drainage and cough.Tessalon for cough. We discussed treatment side effects,  likely course, antibiotic misuse, transmission, and signs of developing a serious illness. She is very concerned she may be developing abacterial sinus infection and we opted after discussion for abx rx to use if not improving. -of course, we advised to return or notify a doctor immediately if symptoms worsen or persist or new concerns arise.    Patient Instructions  Use the tessalon as needed for cough.  Nasal saline twice daily.  Antibiotic if worsening or not improving over the next 3-4 days. Do not get in the sun while taking antibiotic.  I hope you are feeling better soon! Seek care immediately if worsening, new concerns or you are not improving with treatment.     Colin Benton R., DO

## 2016-04-10 NOTE — Patient Instructions (Signed)
Use the tessalon as needed for cough.  Nasal saline twice daily.  Antibiotic if worsening or not improving over the next 3-4 days. Do not get in the sun while taking antibiotic.  I hope you are feeling better soon! Seek care immediately if worsening, new concerns or you are not improving with treatment.

## 2016-04-10 NOTE — Progress Notes (Signed)
Pre visit review using our clinic review tool, if applicable. No additional management support is needed unless otherwise documented below in the visit note. 

## 2016-05-31 ENCOUNTER — Ambulatory Visit (AMBULATORY_SURGERY_CENTER): Payer: Self-pay

## 2016-05-31 VITALS — Ht 65.0 in | Wt 191.8 lb

## 2016-05-31 DIAGNOSIS — Z1211 Encounter for screening for malignant neoplasm of colon: Secondary | ICD-10-CM

## 2016-05-31 MED ORDER — NA SULFATE-K SULFATE-MG SULF 17.5-3.13-1.6 GM/177ML PO SOLN: ORAL | 0 refills | Status: DC

## 2016-05-31 NOTE — Progress Notes (Signed)
Per pt, no allergies to soy or egg products.Pt not taking any weight loss meds or using  O2 at home.  Pt refused Emmi video.  Per pt, sensitive to sedation!

## 2016-06-01 ENCOUNTER — Encounter: Payer: Self-pay | Admitting: Gastroenterology

## 2016-06-09 ENCOUNTER — Ambulatory Visit (INDEPENDENT_AMBULATORY_CARE_PROVIDER_SITE_OTHER): Payer: BC Managed Care – PPO | Admitting: Family Medicine

## 2016-06-09 ENCOUNTER — Encounter: Payer: Self-pay | Admitting: Family Medicine

## 2016-06-09 VITALS — BP 130/80 | HR 80 | Ht 65.0 in | Wt 191.0 lb

## 2016-06-09 DIAGNOSIS — R293 Abnormal posture: Secondary | ICD-10-CM

## 2016-06-09 DIAGNOSIS — M999 Biomechanical lesion, unspecified: Secondary | ICD-10-CM

## 2016-06-09 NOTE — Progress Notes (Signed)
[name] [address], [place] [address] Phone: [phone] Subjective:     CC: Neck and lower back pain follow up  [id number]  Kendra Austin is a 51 y.o. female coming in with complaint of neck and back pain.  Increasing tightness resuming. Has not been seen for 10 weeks. Had been doing very well. Is traveling this summer. 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 Still some tightness. Once to work out more but finds that if she does she gets more increasing tightness of the neck and upper back mild in the lower back.         Past Medical History:  Diagnosis Date  . Arthritis    osteoarthritis; neck, R shoulder  . Atypical chest pain    has had prior cardiac eval in the past/ started 4 years ago/ corrected by back adjustmment  . Complication of anesthesia    Per pt, sensitive to sedation!  . Depression    related to stress from prior job; no medications, hospitalizations or SI  . Diabetes mellitus without complication ([place])    Pre-diabetic per pt!  . DUB (dysfunctional uterine bleeding) 07/22/2015  . Essential tremor 07/22/2015   left hand  . Hemorrhoids   . Seasonal allergies 07/22/2015  . Vertigo    resolves with chiropratic; and sees Dr. Tamala Julian   Past Surgical History:  Procedure Laterality Date  . GANGLION CYST EXCISION     right wrist  . TONSILLECTOMY AND ADENOIDECTOMY  1971/1972  . UPPER GASTROINTESTINAL ENDOSCOPY     Social History  Substance Use Topics  . Smoking status: Never Smoker  . Smokeless tobacco: Never Used  . Alcohol use No   Allergies  Allergen Reactions  . Benadryl [Diphenhydramine Hcl (Sleep)]     Dizzy, extremely drowsy, can cause a lot effects per pt!  Tori Milks [Naproxen Sodium]     Heart palpitations - can take ibuprofen  . Flexeril [Cyclobenzaprine] Other (See Comments)    Makes her extremely tired  . Meloxicam     Made her depressed   Family History  Problem  Relation Age of Onset  . Diabetes Mother   . Macular degeneration Mother   . Heart disease Father   . Diabetes Father   . Macular degeneration Maternal Grandmother      Past medical history, social, surgical and family history all reviewed in electronic medical record.   Review of Systems: No headache, visual changes, nausea, vomiting, diarrhea, constipation, dizziness, abdominal pain, skin rash, fevers, chills, night sweats, weight loss, swollen lymph nodes, body aches, joint swelling, muscle aches, chest pain, shortness of breath, mood changes.      Objective  Blood pressure 130/80, pulse 80, height 5\' 5"  (1.651 m), weight 191 lb (86.6 kg), SpO2 98 %.  Systems examined below as of 06/09/16 General: NAD A&O x3 mood, affect normal  HEENT: Pupils equal, extraocular movements intact no nystagmus Respiratory: not short of breath at rest or with speaking Cardiovascular: No lower extremity edema, non tender Skin: Warm dry intact with no signs of infection or rash on extremities or on axial skeleton. Abdomen: Soft nontender, no masses Neuro: Cranial nerves  intact, neurovascularly intact in all extremities with 2+ DTRs and 2+ pulses. Lymph: No lymphadenopathy appreciated today  Gait normal with good balance and coordination.  MSK: Non tender with full range of motion and good stability and symmetric strength and tone of shoulders, elbows, wrist,  knee hips and ankles bilaterally.     Neck: Inspection unremarkable. No palpable stepoffs. Negative Spurling's maneuver. Lacks last 5-10 of rotation bilaterally. Grip strength and sensation normal in bilateral hands Strength good C4 to T1 distribution No sensory change to C4 to T1 Negative Hoffman sign bilaterally Reflexes normal  Back Exam:  Inspection: Mild loss in lordosis Motion: Flexion 35 deg, Extension 25 deg, Side Bending to 45 deg bilaterally,  Rotation to 45 deg bilaterally  SLR laying: Negative  XSLR laying: Negative    Palpable tenderness tender in the paraspinal musculature of the cervical thoracic] lumbar juncture mostly FABER: Tightness bilaterally about the same as previous exam Sensory change: Gross sensation intact to all lumbar and sacral dermatomes.  Reflexes: 2+ at both patellar tendons, 2+ at achilles tendons, Babinski's downgoing.  Strength at foot  Plantar-flexion: 5/5 Dorsi-flexion: 5/5 Eversion: 5/5 Inversion: 5/5  Leg strength  Quad: 5/5 Hamstring: 5/5 Hip flexor: 5/5 Hip abductors: 5/5  Gait unremarkable.  Osteopathic findings  C4 flexed rotated and side bent left C6 flexed rotated and side bent left T3 extended rotated and side bent right inhaled third rib T9 extended rotated and side bent left L2 flexed rotated and side bent right Sacrum right on right '    Impression and Recommendations:     This case required medical decision making of moderate complexity.

## 2016-06-09 NOTE — Assessment & Plan Note (Signed)
Decision today to treat with OMT was based on Physical Exam  After verbal consent patient was treated with HVLA, ME, FPR techniques in cervical, thoracic, lumbar and sacral areas  Patient tolerated the procedure well with improvement in symptoms  Patient given exercises, stretches and lifestyle modifications  See medications in patient instructions if given  Patient will follow up in 1-2 weeks 

## 2016-06-09 NOTE — Assessment & Plan Note (Signed)
Significant amount of the problem still secondary to poor posture. Discussed with patient crit length. We discussed icing regimen and home exercises. Discussed with patient having more time out of school much this time convenient to benefit the strengthening of the upper back. Patient will consider this. Patient will be traveling a great deal and we discussed different think she can do in total returns. Follow-up again in 3 months.

## 2016-06-09 NOTE — Patient Instructions (Signed)
8-12 weeks.

## 2016-06-14 ENCOUNTER — Ambulatory Visit (AMBULATORY_SURGERY_CENTER): Payer: BC Managed Care – PPO | Admitting: Gastroenterology

## 2016-06-14 ENCOUNTER — Encounter: Payer: Self-pay | Admitting: Gastroenterology

## 2016-06-14 VITALS — BP 140/90 | HR 59 | Temp 98.4°F | Resp 11 | Ht 65.0 in | Wt 191.0 lb

## 2016-06-14 DIAGNOSIS — Z1212 Encounter for screening for malignant neoplasm of rectum: Secondary | ICD-10-CM

## 2016-06-14 DIAGNOSIS — Z1211 Encounter for screening for malignant neoplasm of colon: Secondary | ICD-10-CM

## 2016-06-14 MED ORDER — SODIUM CHLORIDE 0.9 % IV SOLN: 500.0000 mL | INTRAVENOUS | Status: DC

## 2016-06-14 NOTE — Op Note (Signed)
[facility] Patient Name: Kendra Austin Procedure Date: 06/14/2016 9:02 AM MRN: [id number] Endoscopist: Mallie Mussel L. Loletha Carrow , MD Age: 51 Referring MD:  Date of Birth: 1965/02/18 Gender: Female Account #: [id number] Procedure:                Colonoscopy Indications:              Screening for colorectal malignant neoplasm, This                            is the patient's first screening colonoscopy Medicines:                Monitored Anesthesia Care Procedure:                Pre-Anesthesia Assessment:                           - Prior to the procedure, a History and Physical                            was performed, and patient medications and                            allergies were reviewed. The patient's tolerance of                            previous anesthesia was also reviewed. The risks                            and benefits of the procedure and the sedation                            options and risks were discussed with the patient.                            All questions were answered, and informed consent                            was obtained. Prior Anticoagulants: The patient has                            taken no previous anticoagulant or antiplatelet                            agents. ASA Grade Assessment: II - A patient with                            mild systemic disease. After reviewing the risks                            and benefits, the patient was deemed in                            satisfactory condition to undergo the procedure.  After obtaining informed consent, the colonoscope                            was passed under direct vision. Throughout the                            procedure, the patient's blood pressure, pulse, and                            oxygen saturations were monitored continuously. The                            Colonoscope was introduced through the anus and                            advanced to the the  cecum, identified by                            appendiceal orifice and ileocecal valve. The                            colonoscopy was performed without difficulty. The                            patient tolerated the procedure well. The quality                            of the bowel preparation was excellent. The                            ileocecal valve, appendiceal orifice, and rectum                            were photographed. The quality of the bowel                            preparation was evaluated using the BBPS [place]                            Bowel Preparation Scale) with scores of: Right                            Colon = 3, Transverse Colon = 3 and Left Colon = 3                            (entire mucosa seen well with no residual staining,                            small fragments of stool or opaque liquid). The                            total BBPS score equals 9. The bowel preparation  used was SUPREP. Scope In: 9:13:36 AM Scope Out: 9:26:53 AM Scope Withdrawal Time: 0 hours 10 minutes 20 seconds  Total Procedure Duration: 0 hours 13 minutes 17 seconds  Findings:                 The perianal and digital rectal examinations were                            normal.                           The entire examined colon appeared normal on direct                            and retroflexion views. Complications:            No immediate complications. Estimated Blood Loss:     Estimated blood loss: none. Impression:               - The entire examined colon is normal on direct and                            retroflexion views.                           - No specimens collected. Recommendation:           - Patient has a contact number available for                            emergencies. The signs and symptoms of potential                            delayed complications were discussed with the                            patient. Return to normal  activities tomorrow.                            Written discharge instructions were provided to the                            patient.                           - Resume previous diet.                           - Continue present medications.                           - Repeat colonoscopy in 10 years for screening                            purposes. Henry L. Loletha Carrow, MD 06/14/2016 9:33:36 AM This report has been signed electronically.

## 2016-06-14 NOTE — Progress Notes (Signed)
  [facility] Post-op Note  Patient: Kendra Austin  Procedure(s) Performed: colonoscopy  Patient Location: [facility] - Recovery Area  Anesthesia Type: Deep Sedation/Propofol  Level of Consciousness: awake, oriented and patient cooperative  Airway and Oxygen Therapy: Patient Spontanous Breathing  Post-op Pain: none  Post-op Assessment:  Post-op Vital signs reviewed, Patient's Cardiovascular Status Stable, Respiratory Function Stable, Patent Airway, No signs of Nausea or vomiting and Pain level controlled  Post-op Vital Signs: Reviewed and stable  Complications: No apparent anesthesia complications  Samina Weekes E Erica Osuna 0933 AM

## 2016-06-14 NOTE — Progress Notes (Signed)
Pt's states no medical or surgical changes since previsit or office visit. 

## 2016-06-14 NOTE — Patient Instructions (Signed)
Normal colon exam today. Repeat colonoscopy in 10 years.  Call us with any questions or concerns. Thank you!  YOU HAD AN ENDOSCOPIC PROCEDURE TODAY AT [facility]:   Refer to the procedure report that was given to you for any specific questions about what was found during the examination.  If the procedure report does not answer your questions, please call your gastroenterologist to clarify.  If you requested that your care partner not be given the details of your procedure findings, then the procedure report has been included in a sealed envelope for you to review at your convenience later.  YOU SHOULD EXPECT: Some feelings of bloating in the abdomen. Passage of more gas than usual.  Walking can help get rid of the air that was put into your GI tract during the procedure and reduce the bloating. If you had a lower endoscopy (such as a colonoscopy or flexible sigmoidoscopy) you may notice spotting of blood in your stool or on the toilet paper. If you underwent a bowel prep for your procedure, you may not have a normal bowel movement for a few days.  Please Note:  You might notice some irritation and congestion in your nose or some drainage.  This is from the oxygen used during your procedure.  There is no need for concern and it should clear up in a day or so.  SYMPTOMS TO REPORT IMMEDIATELY:   Following lower endoscopy (colonoscopy or flexible sigmoidoscopy):  Excessive amounts of blood in the stool  Significant tenderness or worsening of abdominal pains  Swelling of the abdomen that is new, acute  Fever of 100F or higher   For urgent or emergent issues, a gastroenterologist can be reached at any hour by calling [phone].   DIET:  We do recommend a small meal at first, but then you may proceed to your regular diet.  Drink plenty of fluids but you should avoid alcoholic beverages for 24 hours.  ACTIVITY:  You should plan to take it easy for the rest of today and you  should NOT DRIVE or use heavy machinery until tomorrow (because of the sedation medicines used during the test).    FOLLOW UP: Our staff will call the number listed on your records the next business day following your procedure to check on you and address any questions or concerns that you may have regarding the information given to you following your procedure. If we do not reach you, we will leave a message.  However, if you are feeling well and you are not experiencing any problems, there is no need to return our call.  We will assume that you have returned to your regular daily activities without incident.  If any biopsies were taken you will be contacted by phone or by letter within the next 1-3 weeks.  Please call us at [phone] if you have not heard about the biopsies in 3 weeks.    SIGNATURES/CONFIDENTIALITY: You and/or your care partner have signed paperwork which will be entered into your electronic medical record.  These signatures attest to the fact that that the information above on your After Visit Summary has been reviewed and is understood.  Full responsibility of the confidentiality of this discharge information lies with you and/or your care-partner.

## 2016-06-15 ENCOUNTER — Telehealth: Payer: Self-pay

## 2016-06-15 NOTE — Telephone Encounter (Signed)
  Follow up Call-  Call back number 06/14/2016  Post procedure Call Back phone  # [phone]  Permission to leave phone message Yes     Patient questions:  Do you have a fever, pain , or abdominal swelling? No. Pain Score  0 *  Have you tolerated food without any problems? Yes.    Have you been able to return to your normal activities? Yes.    Do you have any questions about your discharge instructions: Diet   No. Medications  No. Follow up visit  No.  Do you have questions or concerns about your Care? No.  Actions: * If pain score is 4 or above: No action needed, pain <4.

## 2016-06-15 NOTE — Telephone Encounter (Signed)
Left message on answering machine. 

## 2016-06-21 NOTE — Progress Notes (Signed)
HPI:  Kendra Austin is a pleasant 51 y.o. here for follow up. Chronic medical problems summarized below were reviewed for changes and stability and were updated as needed below. These issues and their treatment remain stable for the most part. Was planing to start biking and working on diet last visit. Reports doing "great". Has started exercising the last 6 weeks - is water jogging and has built up to swimming 1 mile! Trying to eat healthier too. Ready to check labs. Denies CP, SOB, DOE, treatment intolerance or new symptoms. Due for labs: Hgba1c, lipids  Hyperglycemia, BMI 31 and HLD:  -lifestyle recommendations advised  GERD: -reports chronic, intermittent, 2ndary to overeating -reports eval with GI and EGD about in 2016 ok -took ppi in the past but it made her anxious -trying to avoid triggers and take zantac at times  Essential Tremor: - chronic, everyone in her family has this -action tremor, not interfering with ativities  DUB: -saw gyn in the past, on OCPs -now seeing gyn here and reports had her gyn exam  ROS: See pertinent positives and negatives per HPI.  Past Medical History:  Diagnosis Date  . Arthritis    osteoarthritis; neck, R shoulder  . Atypical chest pain    has had prior cardiac eval in the past/ started 4 years ago/ corrected by back adjustmment  . Complication of anesthesia    Per pt, sensitive to sedation!  . Depression    related to stress from prior job; no medications, hospitalizations or SI  . DUB (dysfunctional uterine bleeding) 07/22/2015  . Essential tremor 07/22/2015   left hand  . Hemorrhoids   . Hyperglycemia    Pre-diabetic per pt!  . Seasonal allergies 07/22/2015  . Vertigo    resolves with chiropratic; and sees Dr. Tamala Julian    Past Surgical History:  Procedure Laterality Date  . GANGLION CYST EXCISION     right wrist  . TONSILLECTOMY AND ADENOIDECTOMY  1971/1972  . UPPER GASTROINTESTINAL ENDOSCOPY      Family History  Problem  Relation Age of Onset  . Diabetes Mother   . Macular degeneration Mother   . Heart disease Father   . Diabetes Father   . Macular degeneration Maternal Grandmother     Social History   Social History  . Marital status: Single    Spouse name: N/A  . Number of children: N/A  . Years of education: N/A   Social History Main Topics  . Smoking status: Never Smoker  . Smokeless tobacco: Never Used  . Alcohol use No  . Drug use: No  . Sexual activity: Not Asked   Other Topics Concern  . None   Social History Narrative   Work or School: Hydrologist; Soil scientist and pedagogy       Home Situation:       Spiritual Beliefs:       Lifestyle: no regular exercise; diet is pretty good - avoids gluten              Current Outpatient Prescriptions:  .  benzonatate (TESSALON) 100 MG capsule, Take 1 capsule (100 mg total) by mouth 2 (two) times daily as needed for cough., Disp: 20 capsule, Rfl: 0 .  Cholecalciferol (VITAMIN D PO), Take by mouth., Disp: , Rfl:  .  CRYSELLE-28 0.3-30 MG-MCG tablet, Take 1 tablet by mouth daily., Disp: , Rfl: 12 .  fluticasone (FLONASE) 50 MCG/ACT nasal spray, Place 2 sprays into both nostrils daily., Disp: , Rfl:  .  LUTEIN PO, Take by mouth., Disp: , Rfl:  .  Multiple Vitamins-Minerals (MULTIVITAMIN ADULT PO), Take by mouth., Disp: , Rfl:  .  NON FORMULARY, Estrapause, Disp: , Rfl:  .  Omega-3 Fatty Acids (FISH OIL PO), Take by mouth., Disp: , Rfl:  .  ranitidine (ZANTAC) 75 MG tablet, Take 75 mg by mouth at bedtime., Disp: , Rfl:  .  TURMERIC PO, Take by mouth., Disp: , Rfl:   Current Facility-Administered Medications:  .  0.9 %  sodium chloride infusion, 500 mL, Intravenous, Continuous, Danis, Estill Cotta III, MD  EXAM:  Vitals:   06/22/16 0852  BP: 118/72  Pulse: 71  Temp: 98.5 F (36.9 C)    Body mass index is 31.65 kg/m.  GENERAL: vitals reviewed and listed above, alert, oriented, appears well hydrated and in no acute distress  HEENT:  atraumatic, conjunttiva clear, no obvious abnormalities on inspection of external nose and ears  NECK: no obvious masses on inspection  LUNGS: clear to auscultation bilaterally, no wheezes, rales or rhonchi, good air movement  CV: HRRR, no peripheral edema  MS: moves all extremities without noticeable abnormality  PSYCH: pleasant and cooperative, no obvious depression or anxiety  ASSESSMENT AND PLAN:  Discussed the following assessment and plan:  Hyperlipidemia, unspecified hyperlipidemia type - Plan: Lipid panel  Hyperglycemia - Plan: Hemoglobin A1c  BMI 31.0-31.9,adult  -FASTING labs -congratulated and reviewed lifestyle changes -encouraged lifelong healthy diet and regular exercise -follow up 4-6 months  -Patient advised to return or notify a doctor immediately if symptoms worsen or persist or new concerns arise.  Patient Instructions  BEFORE YOU LEAVE: -follow up: 4-6 months -labs  We have ordered labs or studies at this visit. It can take up to 1-2 weeks for results and processing. IF results require follow up or explanation, we will call you with instructions. Clinically stable results will be released to your [facility]. If you have not heard from [place] or cannot find your results in [facility] in 2 weeks please contact our office at [phone].  If you are not yet signed up for [facility], please consider signing up.   We recommend the following healthy lifestyle for LIFE: 1) Small portions.   Tip: eat off of a salad plate instead of a dinner plate.  Tip: if you need more or a snack choose fruits, veggies and/or a handful of nuts or seeds.  2) Eat a healthy clean diet.  * Tip: Avoid (less then 1 serving per week): processed foods, sweets, sweetened drinks, white starches (rice, flour, bread, potatoes, pasta, etc), red meat, fast foods, butter  *Tip: CHOOSE instead   * 5-9 servings per day of fresh or frozen fruits and vegetables (but not corn, potatoes, bananas, canned or  dried fruit)   *nuts and seeds, beans   *olives and olive oil   *small portions of lean meats such as fish and white chicken    *small portions of whole grains  3)Get at least 150 minutes of sweaty aerobic exercise per week.  4)Reduce stress - consider counseling, meditation and relaxation to balance other aspects of your life.          Colin Benton R., DO

## 2016-06-22 ENCOUNTER — Ambulatory Visit (INDEPENDENT_AMBULATORY_CARE_PROVIDER_SITE_OTHER): Payer: BC Managed Care – PPO | Admitting: Family Medicine

## 2016-06-22 ENCOUNTER — Encounter: Payer: Self-pay | Admitting: Family Medicine

## 2016-06-22 VITALS — BP 118/72 | HR 71 | Temp 98.5°F | Ht 65.0 in | Wt 190.2 lb

## 2016-06-22 DIAGNOSIS — R739 Hyperglycemia, unspecified: Secondary | ICD-10-CM

## 2016-06-22 DIAGNOSIS — E785 Hyperlipidemia, unspecified: Secondary | ICD-10-CM | POA: Diagnosis not present

## 2016-06-22 DIAGNOSIS — Z6831 Body mass index (BMI) 31.0-31.9, adult: Secondary | ICD-10-CM | POA: Diagnosis not present

## 2016-06-22 LAB — HEMOGLOBIN A1C: HEMOGLOBIN A1C: 6.3 % (ref 4.6–6.5)

## 2016-06-22 LAB — LIPID PANEL
CHOL/HDL RATIO: 4
Cholesterol: 173 mg/dL (ref 0–200)
HDL: 47.6 mg/dL (ref >39.00)
LDL Cholesterol: 97 mg/dL (ref 0–99)
NONHDL: 125.09
Triglycerides: 139 mg/dL (ref 0.0–149.0)
VLDL: 27.8 mg/dL (ref 0.0–40.0)

## 2016-06-22 NOTE — Patient Instructions (Signed)
BEFORE YOU LEAVE: -follow up: 4-6 months -labs  We have ordered labs or studies at this visit. It can take up to 1-2 weeks for results and processing. IF results require follow up or explanation, we will call you with instructions. Clinically stable results will be released to your [facility]. If you have not heard from [place] or cannot find your results in [facility] in 2 weeks please contact our office at [phone].  If you are not yet signed up for [facility], please consider signing up.   We recommend the following healthy lifestyle for LIFE: 1) Small portions.   Tip: eat off of a salad plate instead of a dinner plate.  Tip: if you need more or a snack choose fruits, veggies and/or a handful of nuts or seeds.  2) Eat a healthy clean diet.  * Tip: Avoid (less then 1 serving per week): processed foods, sweets, sweetened drinks, white starches (rice, flour, bread, potatoes, pasta, etc), red meat, fast foods, butter  *Tip: CHOOSE instead   * 5-9 servings per day of fresh or frozen fruits and vegetables (but not corn, potatoes, bananas, canned or dried fruit)   *nuts and seeds, beans   *olives and olive oil   *small portions of lean meats such as fish and white chicken    *small portions of whole grains  3)Get at least 150 minutes of sweaty aerobic exercise per week.  4)Reduce stress - consider counseling, meditation and relaxation to balance other aspects of your life.

## 2016-09-05 ENCOUNTER — Encounter: Payer: Self-pay | Admitting: Family Medicine

## 2016-09-05 ENCOUNTER — Ambulatory Visit (INDEPENDENT_AMBULATORY_CARE_PROVIDER_SITE_OTHER): Payer: BC Managed Care – PPO | Admitting: Family Medicine

## 2016-09-05 VITALS — BP 118/80 | HR 83 | Ht 65.0 in | Wt 189.0 lb

## 2016-09-05 DIAGNOSIS — M999 Biomechanical lesion, unspecified: Secondary | ICD-10-CM

## 2016-09-05 DIAGNOSIS — R293 Abnormal posture: Secondary | ICD-10-CM

## 2016-09-05 NOTE — Progress Notes (Signed)
[facility] [address], [place] [address] Phone: [phone] Subjective:     CC: Neck and lower back pain follow up  [id number]  Kendra Austin is a 51 y.o. female coming in with complaint of neck and back pain.  Been doing great overall. Ice has been helpful and swimming more. Mild soreness.  Patient denies any new symptoms. Just some mild worsening of previous symptoms. RESPONDS well to the medication.        Past Medical History:  Diagnosis Date  . Arthritis    osteoarthritis; neck, R shoulder  . Atypical chest pain    has had prior cardiac eval in the past/ started 4 years ago/ corrected by back adjustmment  . Complication of anesthesia    Per pt, sensitive to sedation!  . Depression    related to stress from prior job; no medications, hospitalizations or SI  . DUB (dysfunctional uterine bleeding) 07/22/2015  . Essential tremor 07/22/2015   left hand  . Hemorrhoids   . Hyperglycemia    Pre-diabetic per pt!  . Seasonal allergies 07/22/2015  . Vertigo    resolves with chiropratic; and sees Dr. Tamala Julian   Past Surgical History:  Procedure Laterality Date  . GANGLION CYST EXCISION     right wrist  . TONSILLECTOMY AND ADENOIDECTOMY  1971/1972  . UPPER GASTROINTESTINAL ENDOSCOPY     Social History  Substance Use Topics  . Smoking status: Never Smoker  . Smokeless tobacco: Never Used  . Alcohol use No   Allergies  Allergen Reactions  . Benadryl [Diphenhydramine Hcl (Sleep)]     Dizzy, extremely drowsy, can cause a lot effects per pt!  Tori Milks [Naproxen Sodium]     Heart palpitations - can take ibuprofen  . Flexeril [Cyclobenzaprine] Other (See Comments)    Makes her extremely tired  . Meloxicam     Made her depressed   Family History  Problem Relation Age of Onset  . Diabetes Mother   . Macular degeneration Mother   . Heart disease Father   . Diabetes Father   . Macular degeneration Maternal Grandmother       Past medical history, social, surgical and family history all reviewed in electronic medical record.   Review of Systems: No headache, visual changes, nausea, vomiting, diarrhea, constipation, dizziness, abdominal pain, skin rash, fevers, chills, night sweats, weight loss, swollen lymph nodes, body aches, joint swelling, chest pain, shortness of breath, mood changes. Mild muscle aches     Objective  Blood pressure 118/80, pulse 83, height 5\' 5"  (1.651 m), weight 189 lb (85.7 kg), SpO2 98 %.  Systems examined below as of 09/05/16 General: NAD A&O x3 mood, affect normal  HEENT: Pupils equal, extraocular movements intact no nystagmus Respiratory: not short of breath at rest or with speaking Cardiovascular: No lower extremity edema, non tender Skin: Warm dry intact with no signs of infection or rash on extremities or on axial skeleton. Abdomen: Soft nontender, no masses Neuro: Cranial nerves  intact, neurovascularly intact in all extremities with 2+ DTRs and 2+ pulses. Lymph: No lymphadenopathy appreciated today  Gait normal with good balance and coordination.  MSK: Non tender with full range of motion and good stability and symmetric strength and tone of shoulders, elbows, wrist,  knee hips and ankles bilaterally.    Neck: Inspection unremarkable. No palpable stepoffs. Negative Spurling's maneuver. Loss last 5 of extension Grip strength and sensation normal in bilateral hands Strength good C4 to T1  distribution No sensory change to C4 to T1 Negative Hoffman sign bilaterally Reflexes normal  'Back Exam:  Inspection: Mild loss of lordosis Motion: Flexion 45 deg, Extension 25 deg, Side Bending to 35 deg bilaterally,  Rotation to 45 deg bilaterally  SLR laying: Negative  XSLR laying: Negative  Palpable tenderness: Mild tenderness to palpation of the thoracal lumbar junction bilaterally. FABER: negative. Sensory change: Gross sensation intact to all lumbar and sacral  dermatomes.  Reflexes: 2+ at both patellar tendons, 2+ at achilles tendons, Babinski's downgoing.  Strength at foot  Plantar-flexion: 5/5 Dorsi-flexion: 5/5 Eversion: 5/5 Inversion: 5/5  Leg strength  Quad: 5/5 Hamstring: 5/5 Hip flexor: 5/5 Hip abductors: 5/5  Gait unremarkable.  Osteopathic findings Cervical C2 flexed rotated and side bent right C4 flexed rotated and side bent left C6 flexed rotated and side bent left T3 extended rotated and side bent right inhaled third rib T9 extended rotated and side bent left L2 flexed rotated and side bent right Sacrum right on right     Impression and Recommendations:     This case required medical decision making of moderate complexity.

## 2016-09-05 NOTE — Assessment & Plan Note (Signed)
Discussed HEP Discussed icing regimen. We discussed continuing to work on posture. 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 We discussed proper working position. Patient is in agreement will make the changes. Follow-up with me again in 8 weeks

## 2016-09-05 NOTE — Assessment & Plan Note (Signed)
Decision today to treat with OMT was based on Physical Exam  After verbal consent patient was treated with HVLA, ME, FPR techniques in cervical, thoracic, lumbar and sacral areas  Patient tolerated the procedure well with improvement in symptoms  Patient given exercises, stretches and lifestyle modifications  See medications in patient instructions if given  Patient will follow up in 8 weeks 

## 2016-09-05 NOTE — Patient Instructions (Signed)
I  Impressed  Keep It up and that will be challenging with school starting You know the drill Prilosec 2 times daily for 2 weeks See em again in 8 weeks

## 2016-10-30 NOTE — Progress Notes (Signed)
[name] [address], [place] [address] Phone: [phone] Subjective:    I'm seeing this patient by the request  of:    CC: Back and neck pain follow-up  [id number]  Kendra Austin is a 51 y.o. female coming in with complaint of neck and upper back follow-up. Patient has been seen for poor posture. Patient has been doing the home exercises and was making progress. 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 Patient states doing very well.   Patient denies any new symptoms. Working on a more regular basis. His swimming.     Past Medical History:  Diagnosis Date  . Arthritis    osteoarthritis; neck, R shoulder  . Atypical chest pain    has had prior cardiac eval in the past/ started 4 years ago/ corrected by back adjustmment  . Complication of anesthesia    Per pt, sensitive to sedation!  . Depression    related to stress from prior job; no medications, hospitalizations or SI  . DUB (dysfunctional uterine bleeding) 07/22/2015  . Essential tremor 07/22/2015   left hand  . Hemorrhoids   . Hyperglycemia    Pre-diabetic per pt!  . Seasonal allergies 07/22/2015  . Vertigo    resolves with chiropratic; and sees Dr. Tamala Julian   Past Surgical History:  Procedure Laterality Date  . GANGLION CYST EXCISION     right wrist  . TONSILLECTOMY AND ADENOIDECTOMY  1971/1972  . UPPER GASTROINTESTINAL ENDOSCOPY     Social History   Social History  . Marital status: Single    Spouse name: N/A  . Number of children: N/A  . Years of education: N/A   Social History Main Topics  . Smoking status: Never Smoker  . Smokeless tobacco: Never Used  . Alcohol use No  . Drug use: No  . Sexual activity: Not Asked   Other Topics Concern  . None   Social History Narrative   Work or School: Hydrologist; Soil scientist and pedagogy       Home Situation:       Spiritual Beliefs:       Lifestyle: no regular exercise; diet is pretty  good - avoids gluten            Allergies  Allergen Reactions  . Benadryl [Diphenhydramine Hcl (Sleep)]     Dizzy, extremely drowsy, can cause a lot effects per pt!  Tori Milks [Naproxen Sodium]     Heart palpitations - can take ibuprofen  . Flexeril [Cyclobenzaprine] Other (See Comments)    Makes her extremely tired  . Meloxicam     Made her depressed   Family History  Problem Relation Age of Onset  . Diabetes Mother   . Macular degeneration Mother   . Heart disease Father   . Diabetes Father   . Macular degeneration Maternal Grandmother      Past medical history, social, surgical and family history all reviewed in electronic medical record.  No pertanent information unless stated regarding to the chief complaint.   Review of Systems:Review of systems updated and as accurate as of 10/31/16  No visual changes, nausea, vomiting, diarrhea, constipation, dizziness, abdominal pain, skin rash, fevers, chills, night sweats, weight loss, swollen lymph nodes, body aches, joint swelling, muscle aches, chest pain, shortness of breath, mood changes. Positive headaches  Objective  Blood pressure 124/70, pulse 78, height 5\' 5"  (1.651 m), weight 187 lb (84.8 kg), SpO2 97 %. Systems  examined below as of 10/31/16   General: No apparent distress alert and oriented x3 mood and affect normal, dressed appropriately.  HEENT: Pupils equal, extraocular movements intact  Respiratory: Patient's speak in full sentences and does not appear short of breath  Cardiovascular: No lower extremity edema, non tender, no erythema  Skin: Warm dry intact with no signs of infection or rash on extremities or on axial skeleton.  Abdomen: Soft nontender  Neuro: Cranial nerves II through XII are intact, neurovascularly intact in all extremities with 2+ DTRs and 2+ pulses.  Lymph: No lymphadenopathy of posterior or anterior cervical chain or axillae bilaterally.  Gait normal with good balance and coordination.  MSK:   Non tender with full range of motion and good stability and symmetric strength and tone of shoulders, elbows, wrist, hip, knee and ankles bilaterally.  Neck: Inspection mild loss of lordosis. No palpable stepoffs. Negative Spurling's maneuver. Full neck range of motion Grip strength and sensation normal in bilateral hands Strength good C4 to T1 distribution No sensory change to C4 to T1 Negative Hoffman sign bilaterally Reflexes normal Mild increasing kyphosis  Osteopathic findings C2 flexed rotated and side bent right C4 flexed rotated and side bent left C6 flexed rotated and side bent right  T3 extended rotated and side bent right inhaled third rib T9 extended rotated and side bent right  L2 flexed rotated and side bent right Sacrum right on right    Impression and Recommendations:     This case required medical decision making of moderate complexity.      Note: This dictation was prepared with Dragon dictation along with smaller phrase technology. Any transcriptional errors that result from this process are unintentional.

## 2016-10-31 ENCOUNTER — Encounter: Payer: Self-pay | Admitting: Family Medicine

## 2016-10-31 ENCOUNTER — Ambulatory Visit (INDEPENDENT_AMBULATORY_CARE_PROVIDER_SITE_OTHER): Payer: BC Managed Care – PPO | Admitting: Family Medicine

## 2016-10-31 VITALS — BP 124/70 | HR 78 | Ht 65.0 in | Wt 187.0 lb

## 2016-10-31 DIAGNOSIS — R293 Abnormal posture: Secondary | ICD-10-CM

## 2016-10-31 DIAGNOSIS — M999 Biomechanical lesion, unspecified: Secondary | ICD-10-CM

## 2016-10-31 NOTE — Patient Instructions (Addendum)
Good to see you  Keep it up  See you again in 2 months!

## 2016-10-31 NOTE — Assessment & Plan Note (Signed)
Decision today to treat with OMT was based on Physical Exam  After verbal consent patient was treated with HVLA, ME, FPR techniques in cervical, thoracic, lumbar and sacral areas  Patient tolerated the procedure well with improvement in symptoms  Patient given exercises, stretches and lifestyle modifications  See medications in patient instructions if given  Patient will follow up in 8-10 weeks

## 2016-10-31 NOTE — Assessment & Plan Note (Signed)
Poor posture. Encourage her to continue to work on [employer]. 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 Be more active. Patient will see me again in 10 weeks

## 2016-11-22 NOTE — Progress Notes (Signed)
HPI:  Kendra Austin is a pleasant 51 y.o. here for follow up. Chronic medical problems summarized below were reviewed for changes. Reports she continues to eat a lower carb diet and feels much better. She continues to get some regular exercise. She reports she does have some days where she messes up, but for the most part is doing much better with her lifestyle. Her only concern today is dry skin on the face and hands. This happens in the winter. She reports she doesn't tolerate any lotions on the face because they make her sweat. Denies CP, SOB, DOE, treatment intolerance or new symptoms. Due for mammo, flu vaccine.  Hyperglycemia, BMI 31 and HLD:  -lifestyle recommendations advised  GERD: -reports chronic, intermittent, 2ndary to overeating -reports eval with GI and EGD about in 2016 ok -took ppi in the past but it made her anxious -trying to avoid triggers and takes zantac at times  Essential Tremor: - chronic, everyone in her family has this -action tremor, not interfering with ativities  DUB: -saw gyn in the past, on OCPs -now seeing gyn here and reports had her gyn exam   ROS: See pertinent positives and negatives per HPI.  Past Medical History:  Diagnosis Date  . Arthritis    osteoarthritis; neck, R shoulder  . Atypical chest pain    has had prior cardiac eval in the past/ started 4 years ago/ corrected by back adjustmment  . Complication of anesthesia    Per pt, sensitive to sedation!  . Depression    related to stress from prior job; no medications, hospitalizations or SI  . DUB (dysfunctional uterine bleeding) 07/22/2015  . Essential tremor 07/22/2015   left hand  . Hemorrhoids   . Hyperglycemia    Pre-diabetic per pt!  . Seasonal allergies 07/22/2015  . Vertigo    resolves with chiropratic; and sees Dr. Tamala Julian    Past Surgical History:  Procedure Laterality Date  . GANGLION CYST EXCISION     right wrist  . TONSILLECTOMY AND ADENOIDECTOMY  1971/1972  .  UPPER GASTROINTESTINAL ENDOSCOPY      Family History  Problem Relation Age of Onset  . Diabetes Mother   . Macular degeneration Mother   . Heart disease Father   . Diabetes Father   . Macular degeneration Maternal Grandmother     Social History   Social History  . Marital status: Single    Spouse name: N/A  . Number of children: N/A  . Years of education: N/A   Social History Main Topics  . Smoking status: Never Smoker  . Smokeless tobacco: Never Used  . Alcohol use No  . Drug use: No  . Sexual activity: Not Asked   Other Topics Concern  . None   Social History Narrative   Work or School: Hydrologist; Soil scientist and pedagogy       Home Situation:       Spiritual Beliefs:       Lifestyle: no regular exercise; diet is pretty good - avoids gluten              Current Outpatient Prescriptions:  .  Cholecalciferol (VITAMIN D PO), Take by mouth., Disp: , Rfl:  .  CRYSELLE-28 0.3-30 MG-MCG tablet, Take 1 tablet by mouth daily., Disp: , Rfl: 12 .  fluticasone (FLONASE) 50 MCG/ACT nasal spray, Place 2 sprays into both nostrils daily., Disp: , Rfl:  .  LUTEIN PO, Take by mouth., Disp: , Rfl:  .  Multiple  Vitamins-Minerals (MULTIVITAMIN ADULT PO), Take by mouth., Disp: , Rfl:  .  Omega-3 Fatty Acids (FISH OIL PO), Take by mouth., Disp: , Rfl:  .  TURMERIC PO, Take by mouth., Disp: , Rfl:   Current Facility-Administered Medications:  .  0.9 %  sodium chloride infusion, 500 mL, Intravenous, Continuous, Danis, Estill Cotta III, MD  EXAM:  Vitals:   11/23/16 0854  BP: 116/80  Pulse: 77  Temp: 98.3 F (36.8 C)    Body mass index is 31.09 kg/m.  GENERAL: vitals reviewed and listed above, alert, oriented, appears well hydrated and in no acute distress  HEENT: atraumatic, conjunttiva clear, no obvious abnormalities on inspection of external nose and ears  NECK: no obvious masses on inspection  LUNGS: clear to auscultation bilaterally, no wheezes, rales or rhonchi,  good air movement  CV: HRRR, no peripheral edema  MS: moves all extremities without noticeable abnormality  PSYCH: pleasant and cooperative, no obvious depression or anxiety  ASSESSMENT AND PLAN:  Discussed the following assessment and plan:  Xerosis of skin -Trial humidifier at night, advised to follow cleaning instructions carefully -Trial cerave lotion  Hyperlipidemia, unspecified hyperlipidemia type Hyperglycemia BMI 31.0-31.9,adult -congratulated on changes, encouraged a healthy low sugar diet and regular aerobic exercise -Check labs at physical  -Patient advised to return or notify a doctor immediately if symptoms worsen or persist or new concerns arise.  Patient Instructions  BEFORE YOU LEAVE: -follow up: CPE in 3-4 months, come fasting  Try humidifier (follow cleaning instructions very carefully) and Cerave facial lotion - nighttime for the face.  Keep up the healthy diet and regular exercise!    Colin Benton R., DO

## 2016-11-23 ENCOUNTER — Encounter: Payer: Self-pay | Admitting: Family Medicine

## 2016-11-23 ENCOUNTER — Ambulatory Visit (INDEPENDENT_AMBULATORY_CARE_PROVIDER_SITE_OTHER): Payer: BC Managed Care – PPO | Admitting: Family Medicine

## 2016-11-23 VITALS — BP 116/80 | HR 77 | Temp 98.3°F | Ht 65.0 in | Wt 186.8 lb

## 2016-11-23 DIAGNOSIS — E785 Hyperlipidemia, unspecified: Secondary | ICD-10-CM

## 2016-11-23 DIAGNOSIS — Z6831 Body mass index (BMI) 31.0-31.9, adult: Secondary | ICD-10-CM

## 2016-11-23 DIAGNOSIS — R739 Hyperglycemia, unspecified: Secondary | ICD-10-CM

## 2016-11-23 DIAGNOSIS — Z6830 Body mass index (BMI) 30.0-30.9, adult: Secondary | ICD-10-CM | POA: Insufficient documentation

## 2016-11-23 DIAGNOSIS — L853 Xerosis cutis: Secondary | ICD-10-CM

## 2016-11-23 NOTE — Patient Instructions (Addendum)
BEFORE YOU LEAVE: -follow up: CPE in 3-4 months, come fasting  Try humidifier (follow cleaning instructions very carefully) and Cerave facial lotion - nighttime for the face.  Keep up the healthy diet and regular exercise!

## 2017-01-04 ENCOUNTER — Encounter: Payer: Self-pay | Admitting: Family Medicine

## 2017-01-04 ENCOUNTER — Ambulatory Visit: Payer: BC Managed Care – PPO | Admitting: Family Medicine

## 2017-01-04 DIAGNOSIS — R293 Abnormal posture: Secondary | ICD-10-CM | POA: Diagnosis not present

## 2017-01-04 DIAGNOSIS — M999 Biomechanical lesion, unspecified: Secondary | ICD-10-CM | POA: Diagnosis not present

## 2017-01-04 NOTE — Progress Notes (Signed)
[name] [address], [place] [address] Phone: [phone] Subjective:     CC:  Back pain   GEX:[id number]  Kendra Austin is a 51 y.o. female coming in with complaint of back pain patient has been doing relatively well.  Notices that stress seems to be a trigger for the back and the neck.  Patient is going on a cruise and is excited about that.  With her having the school year over for right now notices significant less tension     Past Medical History:  Diagnosis Date  . Arthritis    osteoarthritis; neck, R shoulder  . Atypical chest pain    has had prior cardiac eval in the past/ started 4 years ago/ corrected by back adjustmment  . Complication of anesthesia    Per pt, sensitive to sedation!  . Depression    related to stress from prior job; no medications, hospitalizations or SI  . DUB (dysfunctional uterine bleeding) 07/22/2015  . Essential tremor 07/22/2015   left hand  . Hemorrhoids   . Hyperglycemia    Pre-diabetic per pt!  . Seasonal allergies 07/22/2015  . Vertigo    resolves with chiropratic; and sees Dr. Tamala Julian   Past Surgical History:  Procedure Laterality Date  . GANGLION CYST EXCISION     right wrist  . TONSILLECTOMY AND ADENOIDECTOMY  1971/1972  . UPPER GASTROINTESTINAL ENDOSCOPY     Social History   Socioeconomic History  . Marital status: Single    Spouse name: None  . Number of children: None  . Years of education: None  . Highest education level: None  Social Needs  . Financial resource strain: None  . Food insecurity - worry: None  . Food insecurity - inability: None  . Transportation needs - medical: None  . Transportation needs - non-medical: None  Occupational History  . None  Tobacco Use  . Smoking status: Never Smoker  . Smokeless tobacco: Never Used  Substance and Sexual Activity  . Alcohol use: No    Alcohol/week: 0.0 oz  . Drug use: No  . Sexual activity: None  Other Topics Concern  .  None  Social History Narrative   Work or School: Hydrologist; Soil scientist and pedagogy       Home Situation:       Spiritual Beliefs:       Lifestyle: no regular exercise; diet is pretty good - avoids gluten         Allergies  Allergen Reactions  . Benadryl [Diphenhydramine Hcl (Sleep)]     Dizzy, extremely drowsy, can cause a lot effects per pt!  Tori Milks [Naproxen Sodium]     Heart palpitations - can take ibuprofen  . Flexeril [Cyclobenzaprine] Other (See Comments)    Makes her extremely tired  . Meloxicam     Made her depressed   Family History  Problem Relation Age of Onset  . Diabetes Mother   . Macular degeneration Mother   . Heart disease Father   . Diabetes Father   . Macular degeneration Maternal Grandmother      Past medical history, social, surgical and family history all reviewed in electronic medical record.  No pertanent information unless stated regarding to the chief complaint.   Review of Systems:Review of systems updated and as accurate as of 01/04/17  No headache, visual changes, nausea, vomiting, diarrhea, constipation, dizziness, abdominal pain, skin rash, fevers, chills, night sweats, weight loss, swollen lymph nodes, body  aches, joint swelling, muscle aches, chest pain, shortness of breath, mood changes.   Objective  Blood pressure 124/80, pulse 79, height 5\' 5"  (1.651 m), weight 189 lb (85.7 kg), SpO2 98 %. Systems examined below as of 01/04/17   General: No apparent distress alert and oriented x3 mood and affect normal, dressed appropriately.  HEENT: Pupils equal, extraocular movements intact  Respiratory: Patient's speak in full sentences and does not appear short of breath  Cardiovascular: No lower extremity edema, non tender, no erythema  Skin: Warm dry intact with no signs of infection or rash on extremities or on axial skeleton.  Abdomen: Soft nontender  Neuro: Cranial nerves II through XII are intact, neurovascularly intact in all extremities  with 2+ DTRs and 2+ pulses.  Lymph: No lymphadenopathy of posterior or anterior cervical chain or axillae bilaterally.  Gait normal with good balance and coordination.  MSK:  Non tender with full range of motion and good stability and symmetric strength and tone of shoulders, elbows, wrist, hip, knee and ankles bilaterally.  Neck: Inspection unremarkable. No palpable stepoffs. Negative Spurling's maneuver. Full neck range of motion Grip strength and sensation normal in bilateral hands Strength good C4 to T1 distribution No sensory change to C4 to T1 Negative Hoffman sign bilaterally Reflexes normal Mild tightness of the trapezius bilaterally  Osteopathic findings C2 flexed rotated and side bent right C4 flexed rotated and side bent left C7 flexed rotated and side bent left T3 extended rotated and side bent right inhaled third rib T6 extended rotated and side bent left L2 flexed rotated and side bent right Sacrum right on right    Impression and Recommendations:     This case required medical decision making of moderate complexity.      Note: This dictation was prepared with Dragon dictation along with smaller phrase technology. Any transcriptional errors that result from this process are unintentional.

## 2017-01-04 NOTE — Assessment & Plan Note (Signed)
Discussed with patient again at great length.  We discussed icing regimen, home exercise, but has been responding fairly well to conservative therapy including osteopathic manipulation.  No significant changes and follow-up again in 2-3 months.

## 2017-01-04 NOTE — Patient Instructions (Signed)
Great to see you  Overall you are doing great  Keep it up  Have a great cruise.  See you again in 2 months

## 2017-01-04 NOTE — Assessment & Plan Note (Signed)
Decision today to treat with OMT was based on Physical Exam  After verbal consent patient was treated with HVLA, ME, FPR techniques in cervical, thoracic, lumbar and sacral areas  Patient tolerated the procedure well with improvement in symptoms  Patient given exercises, stretches and lifestyle modifications  See medications in patient instructions if given  Patient will follow up in 4-6 weeks 

## 2017-01-30 ENCOUNTER — Encounter: Payer: Self-pay | Admitting: Family Medicine

## 2017-01-30 ENCOUNTER — Ambulatory Visit: Payer: BC Managed Care – PPO | Admitting: Family Medicine

## 2017-01-30 VITALS — BP 128/70 | HR 86 | Temp 97.8°F | Ht 65.0 in | Wt 188.6 lb

## 2017-01-30 DIAGNOSIS — H6983 Other specified disorders of Eustachian tube, bilateral: Secondary | ICD-10-CM

## 2017-01-30 DIAGNOSIS — J310 Chronic rhinitis: Secondary | ICD-10-CM

## 2017-01-30 DIAGNOSIS — J329 Chronic sinusitis, unspecified: Secondary | ICD-10-CM

## 2017-01-30 MED ORDER — DOXYCYCLINE HYCLATE 100 MG PO TABS: 100.0000 mg | ORAL_TABLET | Freq: Two times a day (BID) | ORAL | 0 refills | Status: DC

## 2017-01-30 NOTE — Patient Instructions (Signed)
Take the antibiotic (doxycycline) as instructed.  IONS FOR UPPER RESPIRATORY INFECTION:  -plenty of rest and fluids  -nasal saline wash 2-3 times daily (use prepackaged nasal saline or bottled/distilled water if making your own)   -can use AFRIN nasal spray for drainage and nasal congestion - but do NOT use longer then 3-4 days  -can use tylenol (in no history of liver disease) or ibuprofen (if no history of kidney disease, bowel bleeding or significant heart disease) as directed for aches and sorethroat  -in the winter time, using a humidifier at night is helpful (please follow cleaning instructions)  -if you are taking a cough medication - use only as directed, may also try a teaspoon of honey to coat the throat and throat lozenges. If given a cough medication with codeine or hydrocodone or other narcotic please be advised that this contains a strong and  potentially addicting medication. Please follow instructions carefully, take as little as possible and only use AS NEEDED for severe cough. Discuss potential side effects with your pharmacy. Please do not drive or operate machinery while taking these types of medications. Please do not take other sedating medications, drugs or alcohol while taking this medication without discussing with your doctor.  -for sore throat, salt water gargles can help  -follow up if you have fevers, facial pain, tooth pain, difficulty breathing or are worsening or symptoms persist longer then expected  Upper Respiratory Infection, Adult An upper respiratory infection (URI) is also known as the common cold. It is often caused by a type of germ (virus). Colds are easily spread (contagious). You can pass it to others by kissing, coughing, sneezing, or drinking out of the same glass. Usually, you get better in 1 to 3  weeks.  However, the cough can last for even longer. HOME CARE   Only take medicine as told by your doctor. Follow instructions provided  above.  Drink enough water and fluids to keep your pee (urine) clear or pale yellow.  Get plenty of rest.  Return to work when your temperature is < 100 for 24 hours or as told by your doctor. You may use a face mask and wash your hands to stop your cold from spreading. GET HELP RIGHT AWAY IF:   After the first few days, you feel you are getting worse.  You have questions about your medicine.  You have chills, shortness of breath, or red spit (mucus).  You have pain in the face for more then 1-2 days, especially when you bend forward.  You have a fever, puffy (swollen) neck, pain when you swallow, or white spots in the back of your throat.  You have a bad headache, ear pain, sinus pain, or chest pain.  You have a high-pitched whistling sound when you breathe in and out (wheezing).  You cough up blood.  You have sore muscles or a stiff neck. MAKE SURE YOU:   Understand these instructions.  Will watch your condition.  Will get help right away if you are not doing well or get worse. Document Released: 06/28/2007 Document Revised: 04/03/2011 Document Reviewed: 04/16/2013 [facility] Patient Information 2015 [place], [place]. This information is not intended to replace advice given to you by your health care provider. Make sure you discuss any questions you have with your health care provider.

## 2017-01-30 NOTE — Progress Notes (Signed)
HPI:   Acute visit for upper respiratory illness: -started 2 weeks ago -Symptoms include nasal congestion, fullness and pressure in the ears, sinus pain mainly in the right maxillary area, thick sinus congestion recently, some lightheadedness since getting off a cruise -, and for her even when she does not have a sinus infection -She just started Flonase -No fevers, body aches, shortness of breath, wheezing, headaches, or malaise  ROS: See pertinent positives and negatives per HPI.  Past Medical History:  Diagnosis Date  . Arthritis    osteoarthritis; neck, R shoulder  . Atypical chest pain    has had prior cardiac eval in the past/ started 4 years ago/ corrected by back adjustmment  . Complication of anesthesia    Per pt, sensitive to sedation!  . Depression    related to stress from prior job; no medications, hospitalizations or SI  . DUB (dysfunctional uterine bleeding) 07/22/2015  . Essential tremor 07/22/2015   left hand  . Hemorrhoids   . Hyperglycemia    Pre-diabetic per pt!  . Seasonal allergies 07/22/2015  . Vertigo    resolves with chiropratic; and sees Dr. Tamala Julian    Past Surgical History:  Procedure Laterality Date  . GANGLION CYST EXCISION     right wrist  . TONSILLECTOMY AND ADENOIDECTOMY  1971/1972  . UPPER GASTROINTESTINAL ENDOSCOPY      Family History  Problem Relation Age of Onset  . Diabetes Mother   . Macular degeneration Mother   . Heart disease Father   . Diabetes Father   . Macular degeneration Maternal Grandmother     Social History   Socioeconomic History  . Marital status: Single    Spouse name: None  . Number of children: None  . Years of education: None  . Highest education level: None  Social Needs  . Financial resource strain: None  . Food insecurity - worry: None  . Food insecurity - inability: None  . Transportation needs - medical: None  . Transportation needs - non-medical: None  Occupational History  . None  Tobacco Use   . Smoking status: Never Smoker  . Smokeless tobacco: Never Used  Substance and Sexual Activity  . Alcohol use: No    Alcohol/week: 0.0 oz  . Drug use: No  . Sexual activity: None  Other Topics Concern  . None  Social History Narrative   Work or School: Hydrologist; Soil scientist and pedagogy       Home Situation:       Spiritual Beliefs:       Lifestyle: no regular exercise; diet is pretty good - avoids gluten           Current Outpatient Medications:  .  Cholecalciferol (VITAMIN D PO), Take by mouth., Disp: , Rfl:  .  CRYSELLE-28 0.3-30 MG-MCG tablet, Take 1 tablet by mouth daily., Disp: , Rfl: 12 .  fluticasone (FLONASE) 50 MCG/ACT nasal spray, Place 2 sprays into both nostrils daily., Disp: , Rfl:  .  LUTEIN PO, Take by mouth., Disp: , Rfl:  .  Multiple Vitamins-Minerals (MULTIVITAMIN ADULT PO), Take by mouth., Disp: , Rfl:  .  Omega-3 Fatty Acids (FISH OIL PO), Take by mouth., Disp: , Rfl:  .  TURMERIC PO, Take by mouth., Disp: , Rfl:  .  doxycycline (VIBRA-TABS) 100 MG tablet, Take 1 tablet (100 mg total) by mouth 2 (two) times daily., Disp: 14 tablet, Rfl: 0  Current Facility-Administered Medications:  .  0.9 %  sodium chloride infusion, 500  mL, Intravenous, Continuous, Danis, Estill Cotta III, MD  EXAM:  Vitals:   01/30/17 0915  BP: 128/70  Pulse: 86  Temp: 97.8 F (36.6 C)    Body mass index is 31.38 kg/m.  GENERAL: vitals reviewed and listed above, alert, oriented, appears well hydrated and in no acute distress  HEENT: atraumatic, conjunttiva clear, no obvious abnormalities on inspection of external nose and ears, normal appearance of ear canals and TMs except for clear effusions bilaterally, thick nasal congestion, mild post oropharyngeal erythema with PND, no tonsillar edema or exudate, no sinus TTP  NECK: no obvious masses on inspection  LUNGS: clear to auscultation bilaterally, no wheezes, rales or rhonchi, good air movement  CV: HRRR, no peripheral  edema  MS: moves all extremities without noticeable abnormality  PSYCH: pleasant and cooperative, no obvious depression or anxiety  ASSESSMENT AND PLAN:  Discussed the following assessment and plan:  Rhinosinusitis  Dysfunction of both eustachian tubes  -likely has developed a sinus infection given duration of symptoms and worsening rather then improving, also with ETD -opted for abx and discussed risks, benefits, potential complications, return precuations -cont INS as weel -Patient advised to return or notify a doctor immediately if symptoms worsen or persist or new concerns arise.  Patient Instructions  Take the antibiotic (doxycycline) as instructed.  IONS FOR UPPER RESPIRATORY INFECTION:  -plenty of rest and fluids  -nasal saline wash 2-3 times daily (use prepackaged nasal saline or bottled/distilled water if making your own)   -can use AFRIN nasal spray for drainage and nasal congestion - but do NOT use longer then 3-4 days  -can use tylenol (in no history of liver disease) or ibuprofen (if no history of kidney disease, bowel bleeding or significant heart disease) as directed for aches and sorethroat  -in the winter time, using a humidifier at night is helpful (please follow cleaning instructions)  -if you are taking a cough medication - use only as directed, may also try a teaspoon of honey to coat the throat and throat lozenges. If given a cough medication with codeine or hydrocodone or other narcotic please be advised that this contains a strong and  potentially addicting medication. Please follow instructions carefully, take as little as possible and only use AS NEEDED for severe cough. Discuss potential side effects with your pharmacy. Please do not drive or operate machinery while taking these types of medications. Please do not take other sedating medications, drugs or alcohol while taking this medication without discussing with your doctor.  -for sore throat, salt  water gargles can help  -follow up if you have fevers, facial pain, tooth pain, difficulty breathing or are worsening or symptoms persist longer then expected  Upper Respiratory Infection, Adult An upper respiratory infection (URI) is also known as the common cold. It is often caused by a type of germ (virus). Colds are easily spread (contagious). You can pass it to others by kissing, coughing, sneezing, or drinking out of the same glass. Usually, you get better in 1 to 3  weeks.  However, the cough can last for even longer. HOME CARE   Only take medicine as told by your doctor. Follow instructions provided above.  Drink enough water and fluids to keep your pee (urine) clear or pale yellow.  Get plenty of rest.  Return to work when your temperature is < 100 for 24 hours or as told by your doctor. You may use a face mask and wash your hands to stop your cold from  spreading. GET HELP RIGHT AWAY IF:   After the first few days, you feel you are getting worse.  You have questions about your medicine.  You have chills, shortness of breath, or red spit (mucus).  You have pain in the face for more then 1-2 days, especially when you bend forward.  You have a fever, puffy (swollen) neck, pain when you swallow, or white spots in the back of your throat.  You have a bad headache, ear pain, sinus pain, or chest pain.  You have a high-pitched whistling sound when you breathe in and out (wheezing).  You cough up blood.  You have sore muscles or a stiff neck. MAKE SURE YOU:   Understand these instructions.  Will watch your condition.  Will get help right away if you are not doing well or get worse. Document Released: 06/28/2007 Document Revised: 04/03/2011 Document Reviewed: 04/16/2013 [facility] Patient Information 2015 [place], [place]. This information is not intended to replace advice given to you by your health care provider. Make sure you discuss any questions you have with your health  care provider.      Colin Benton R., DO

## 2017-02-01 ENCOUNTER — Telehealth: Payer: Self-pay | Admitting: Family Medicine

## 2017-02-01 NOTE — Telephone Encounter (Signed)
Pt called saying when she takes her antibiotic (doxycycline) on an empty stomach as suggested, she gets a little nauseous. She stated that she would eat and then take it. Advised to check with the pharmacist to make sure there is not a problem with her taking this med with food. Pt voiced understanding.

## 2017-02-27 NOTE — Progress Notes (Signed)
HPI:  Here for CPE:  -Concerns and/or follow up today:   Kendra Austin is a pleasant 52 y.o. here for CPE and follow up. Chronic medical problems summarized below were reviewed for changes and stability and were updated as needed below. These issues and their treatment remain stable for the most part.  Reports is doing well for the most part.  She suffered from a respiratory illness, but that has resolved.  She recently got back to going to the gym.  Reports her diet has not been good for the last month. Denies CP, SOB, DOE, treatment intolerance or new symptoms. Due for labs, mammogram  Hyperglycemia, BMI 31and HLD:  -lifestyle recommendations advised  GERD: -reports chronic, intermittent, 2ndary to overeating -reports eval with GI and EGD about in 2016 ok -took ppi in the past but it made her anxious -trying to avoid triggers and takes zantac at times  Essential Tremor: - chronic, everyone in her family has this -action tremor, not interfering with ativities  DUB: -saw gyn in the past, on OCPs -now seeing gyn here and reports did her gyn exam  -Diet: variety of foods, balance and well rounded, larger portion sizes -Exercise: no regular exercise -Taking folic acid, vitamin D or calcium: no -Diabetes and Dyslipidemia Screening: Fasting for labs -Vaccines: see vaccine section EPIC -pap history: Sees Dr. Lysle Rubens, last Pap 2017, HPV negative -FDLMP: see nursing notes -sexual activity: yes, female partner, no new partners -wants STI testing (Hep C if born 39-65): no -FH breast, colon or ovarian ca: see FH Last mammogram: Sees gynecologist Last colon cancer screening: Had colonoscopy 05/2016 Breast Ca Risk Assessment: see family history and pt history DEXA (>/= 65): Not applicable  -Alcohol, Tobacco, drug use: see social history  Review of Systems - no fevers, unintentional weight loss, vision loss, hearing loss, chest pain, sob, hemoptysis, melena,  hematochezia, hematuria, genital discharge, changing or concerning skin lesions, bleeding, bruising, loc, thoughts of self harm or SI  Past Medical History:  Diagnosis Date  . Arthritis    osteoarthritis; neck, R shoulder  . Atypical chest pain    has had prior cardiac eval in the past/ started 4 years ago/ corrected by back adjustmment  . Complication of anesthesia    Per pt, sensitive to sedation!  . Depression    related to stress from prior job; no medications, hospitalizations or SI  . DUB (dysfunctional uterine bleeding) 07/22/2015  . Essential tremor 07/22/2015   left hand  . Hemorrhoids   . Hyperglycemia    Pre-diabetic per pt!  . Seasonal allergies 07/22/2015  . Vertigo    resolves with chiropratic; and sees Dr. Tamala Julian    Past Surgical History:  Procedure Laterality Date  . GANGLION CYST EXCISION     right wrist  . TONSILLECTOMY AND ADENOIDECTOMY  1971/1972  . UPPER GASTROINTESTINAL ENDOSCOPY      Family History  Problem Relation Age of Onset  . Diabetes Mother   . Macular degeneration Mother   . Heart disease Father   . Diabetes Father   . Macular degeneration Maternal Grandmother     Social History   Socioeconomic History  . Marital status: Single    Spouse name: None  . Number of children: None  . Years of education: None  . Highest education level: None  Social Needs  . Financial resource strain: None  . Food insecurity - worry: None  . Food insecurity - inability: None  . Transportation needs - medical: None  .  Transportation needs - non-medical: None  Occupational History  . None  Tobacco Use  . Smoking status: Never Smoker  . Smokeless tobacco: Never Used  Substance and Sexual Activity  . Alcohol use: No    Alcohol/week: 0.0 oz  . Drug use: No  . Sexual activity: None  Other Topics Concern  . None  Social History Narrative   Work or School: Hydrologist; Soil scientist and pedagogy       Home Situation:       Spiritual Beliefs:        Lifestyle: no regular exercise; diet is pretty good - avoids gluten           Current Outpatient Medications:  .  Cholecalciferol (VITAMIN D PO), Take by mouth., Disp: , Rfl:  .  CRYSELLE-28 0.3-30 MG-MCG tablet, Take 1 tablet by mouth daily., Disp: , Rfl: 12 .  fluticasone (FLONASE) 50 MCG/ACT nasal spray, Place 2 sprays into both nostrils daily., Disp: , Rfl:  .  LUTEIN PO, Take by mouth., Disp: , Rfl:  .  Multiple Vitamins-Minerals (MULTIVITAMIN ADULT PO), Take by mouth., Disp: , Rfl:  .  Omega-3 Fatty Acids (FISH OIL PO), Take by mouth., Disp: , Rfl:  .  TURMERIC PO, Take by mouth., Disp: , Rfl:   Current Facility-Administered Medications:  .  0.9 %  sodium chloride infusion, 500 mL, Intravenous, Continuous, Danis, Estill Cotta III, MD  EXAM:  Vitals:   03/01/17 0811  BP: 132/76  Pulse: 76  Temp: 98.2 F (36.8 C)  Body mass index is 30.89 kg/m.   GENERAL: vitals reviewed and listed below, alert, oriented, appears well hydrated and in no acute distress  HEENT: head atraumatic, PERRLA, normal appearance of eyes, ears, nose and mouth. moist mucus membranes.  NECK: supple, no masses or lymphadenopathy  LUNGS: clear to auscultation bilaterally, no rales, rhonchi or wheeze  CV: HRRR, no peripheral edema or cyanosis, normal pedal pulses  ABDOMEN: bowel sounds normal, soft, non tender to palpation, no masses, no rebound or guarding  GU/BREAST: Declined, does with GYN  SKIN: no rash or abnormal lesions  MS: normal gait, moves all extremities normally  NEURO: normal gait, speech and thought processing grossly intact, muscle tone grossly intact throughout  PSYCH: normal affect, pleasant and cooperative  ASSESSMENT AND PLAN:  Discussed the following assessment and plan:  PREVENTIVE EXAM: -Discussed and advised all [place] preventive services health task force level A and B recommendations for age, sex and risks. -Advised at least 150 minutes of exercise per week and a healthy  diet with avoidance of (less then 1 serving per week) processed foods, white starches, red meat, fast foods and sweets and consisting of: * 5-9 servings of fresh fruits and vegetables (not corn or potatoes) *nuts and seeds, beans *olives and olive oil *lean meats such as fish and white chicken  *whole grains -labs, studies and vaccines per orders this encounter  Hyperlipidemia, unspecified hyperlipidemia type - Plan: Lipid panel  Hyperglycemia - Plan: Hemoglobin A1c  BMI 30.0-30.9,adult - see patient instructions, healthy diet and regular aerobic exercise advised  Patient Instructions  BEFORE YOU LEAVE: -labs -follow up: 4-6 months  We have ordered labs or studies at this visit. It can take up to 1-2 weeks for results and processing. IF results require follow up or explanation, we will call you with instructions. Clinically stable results will be released to your [facility]. If you have not heard from [place] or cannot find your results in [facility] in 2  weeks please contact our office at [phone].  If you are not yet signed up for [facility], please consider signing up.   We recommend the following healthy lifestyle for LIFE: 1) Small portions. But, make sure to get regular (at least 3 per day), healthy meals and small healthy snacks if needed.  2) Eat a healthy clean diet.   TRY TO EAT: -at least 5-7 servings of low sugar, colorful, and nutrient rich vegetables per day (not corn, potatoes or bananas.) -berries are the best choice if you wish to eat fruit (only eat small amounts if trying to reduce weight)  -lean meets (fish, white meat of chicken or [place]) -vegan proteins for some meals - beans or tofu, whole grains, nuts and seeds -Replace bad fats with good fats - good fats include: fish, nuts and seeds, canola oil, olive oil -small amounts of low fat or non fat dairy -small amounts of100 % whole grains - check the lables -drink plenty of water  AVOID: -SUGAR, sweets, anything with  added sugar, corn syrup or sweeteners - must read labels as even foods advertised as "healthy" often are loaded with sugar -if you must have a sweetener, small amounts of stevia may be best -sweetened beverages and artificially sweetened beverages -simple starches (rice, bread, potatoes, pasta, chips, etc - small amounts of 100% whole grains are ok) -red meat, pork, butter -fried foods, fast food, processed food, excessive dairy, eggs and coconut.  3)Get at least 150 minutes of sweaty aerobic exercise per week.  4)Reduce stress - consider counseling, meditation and relaxation to balance other aspects of your life.   Preventive Care 40-64 Years, Female Preventive care refers to lifestyle choices and visits with your health care provider that can promote health and wellness. What does preventive care include?  A yearly physical exam. This is also called an annual well check.  Dental exams once or twice a year.  Routine eye exams. Ask your health care provider how often you should have your eyes checked.  Personal lifestyle choices, including: ? Daily care of your teeth and gums. ? Regular physical activity. ? Eating a healthy diet. ? Avoiding tobacco and drug use. ? Limiting alcohol use. ? Practicing safe sex. ? Taking low-dose aspirin daily starting at age 76. ? Taking vitamin and mineral supplements as recommended by your health care provider. What happens during an annual well check? The services and screenings done by your health care provider during your annual well check will depend on your age, overall health, lifestyle risk factors, and family history of disease. Counseling Your health care provider may ask you questions about your:  Alcohol use.  Tobacco use.  Drug use.  Emotional well-being.  Home and relationship well-being.  Sexual activity.  Eating habits.  Work and work Statistician.  Method of birth control.  Menstrual cycle.  Pregnancy  history.  Screening You may have the following tests or measurements:  Height, weight, and BMI.  Blood pressure.  Lipid and cholesterol levels. These may be checked every 5 years, or more frequently if you are over 18 years old.  Skin check.  Lung cancer screening. You may have this screening every year starting at age 74 if you have a 30-pack-year history of smoking and currently smoke or have quit within the past 15 years.  Fecal occult blood test (FOBT) of the stool. You may have this test every year starting at age 42.  Flexible sigmoidoscopy or colonoscopy. You may have a sigmoidoscopy every 5  years or a colonoscopy every 10 years starting at age 57.  Hepatitis C blood test.  Hepatitis B blood test.  Sexually transmitted disease (STD) testing.  Diabetes screening. This is done by checking your blood sugar (glucose) after you have not eaten for a while (fasting). You may have this done every 1-3 years.  Mammogram. This may be done every 1-2 years. Talk to your health care provider about when you should start having regular mammograms. This may depend on whether you have a family history of breast cancer.  BRCA-related cancer screening. This may be done if you have a family history of breast, ovarian, tubal, or peritoneal cancers.  Pelvic exam and Pap test. This may be done every 3 years starting at age 1. Starting at age 25, this may be done every 5 years if you have a Pap test in combination with an HPV test.  Bone density scan. This is done to screen for osteoporosis. You may have this scan if you are at high risk for osteoporosis.  Discuss your test results, treatment options, and if necessary, the need for more tests with your health care provider. Vaccines Your health care provider may recommend certain vaccines, such as:  Influenza vaccine. This is recommended every year.  Tetanus, diphtheria, and acellular pertussis (Tdap, Td) vaccine. You may need a Td booster  every 10 years.  Varicella vaccine. You may need this if you have not been vaccinated.  Zoster vaccine. You may need this after age 67.  Measles, mumps, and rubella (MMR) vaccine. You may need at least one dose of MMR if you were born in 1957 or later. You may also need a second dose.  Pneumococcal 13-valent conjugate (PCV13) vaccine. You may need this if you have certain conditions and were not previously vaccinated.  Pneumococcal polysaccharide (PPSV23) vaccine. You may need one or two doses if you smoke cigarettes or if you have certain conditions.  Meningococcal vaccine. You may need this if you have certain conditions.  Hepatitis A vaccine. You may need this if you have certain conditions or if you travel or work in places where you may be exposed to hepatitis A.  Hepatitis B vaccine. You may need this if you have certain conditions or if you travel or work in places where you may be exposed to hepatitis B.  Haemophilus influenzae type b (Hib) vaccine. You may need this if you have certain conditions.  Talk to your health care provider about which screenings and vaccines you need and how often you need them. This information is not intended to replace advice given to you by your health care provider. Make sure you discuss any questions you have with your health care provider. Document Released: 02/05/2015 Document Revised: 09/29/2015 Document Reviewed: 11/10/2014 Elsevier Interactive Patient Education  2018 [employer].         No Follow-up on file.  [name], DO

## 2017-03-01 ENCOUNTER — Encounter: Payer: Self-pay | Admitting: Family Medicine

## 2017-03-01 ENCOUNTER — Ambulatory Visit (INDEPENDENT_AMBULATORY_CARE_PROVIDER_SITE_OTHER): Payer: BC Managed Care – PPO | Admitting: Family Medicine

## 2017-03-01 VITALS — BP 132/76 | HR 76 | Temp 98.2°F | Ht 65.0 in | Wt 185.6 lb

## 2017-03-01 DIAGNOSIS — Z0000 Encounter for general adult medical examination without abnormal findings: Secondary | ICD-10-CM

## 2017-03-01 DIAGNOSIS — E785 Hyperlipidemia, unspecified: Secondary | ICD-10-CM

## 2017-03-01 DIAGNOSIS — Z6830 Body mass index (BMI) 30.0-30.9, adult: Secondary | ICD-10-CM | POA: Diagnosis not present

## 2017-03-01 DIAGNOSIS — R739 Hyperglycemia, unspecified: Secondary | ICD-10-CM | POA: Diagnosis not present

## 2017-03-01 LAB — LIPID PANEL
CHOL/HDL RATIO: 4
Cholesterol: 166 mg/dL (ref 0–200)
HDL: 41.7 mg/dL (ref >39.00)
LDL Cholesterol: 99 mg/dL (ref 0–99)
NONHDL: 124.31
Triglycerides: 126 mg/dL (ref 0.0–149.0)
VLDL: 25.2 mg/dL (ref 0.0–40.0)

## 2017-03-01 LAB — HEMOGLOBIN A1C: Hgb A1c MFr Bld: 6.8 % — ABNORMAL HIGH (ref 4.6–6.5)

## 2017-03-01 NOTE — Patient Instructions (Signed)
BEFORE YOU LEAVE: -labs -follow up: 4-6 months  We have ordered labs or studies at this visit. It can take up to 1-2 weeks for results and processing. IF results require follow up or explanation, we will call you with instructions. Clinically stable results will be released to your [facility]. If you have not heard from [place] or cannot find your results in [facility] in 2 weeks please contact our office at [phone].  If you are not yet signed up for [facility], please consider signing up.   We recommend the following healthy lifestyle for LIFE: 1) Small portions. But, make sure to get regular (at least 3 per day), healthy meals and small healthy snacks if needed.  2) Eat a healthy clean diet.   TRY TO EAT: -at least 5-7 servings of low sugar, colorful, and nutrient rich vegetables per day (not corn, potatoes or bananas.) -berries are the best choice if you wish to eat fruit (only eat small amounts if trying to reduce weight)  -lean meets (fish, white meat of chicken or [place]) -vegan proteins for some meals - beans or tofu, whole grains, nuts and seeds -Replace bad fats with good fats - good fats include: fish, nuts and seeds, canola oil, olive oil -small amounts of low fat or non fat dairy -small amounts of100 % whole grains - check the lables -drink plenty of water  AVOID: -SUGAR, sweets, anything with added sugar, corn syrup or sweeteners - must read labels as even foods advertised as "healthy" often are loaded with sugar -if you must have a sweetener, small amounts of stevia may be best -sweetened beverages and artificially sweetened beverages -simple starches (rice, bread, potatoes, pasta, chips, etc - small amounts of 100% whole grains are ok) -red meat, pork, butter -fried foods, fast food, processed food, excessive dairy, eggs and coconut.  3)Get at least 150 minutes of sweaty aerobic exercise per week.  4)Reduce stress - consider counseling, meditation and relaxation to balance  other aspects of your life.   Preventive Care 40-64 Years, Female Preventive care refers to lifestyle choices and visits with your health care provider that can promote health and wellness. What does preventive care include?  A yearly physical exam. This is also called an annual well check.  Dental exams once or twice a year.  Routine eye exams. Ask your health care provider how often you should have your eyes checked.  Personal lifestyle choices, including: ? Daily care of your teeth and gums. ? Regular physical activity. ? Eating a healthy diet. ? Avoiding tobacco and drug use. ? Limiting alcohol use. ? Practicing safe sex. ? Taking low-dose aspirin daily starting at age 41. ? Taking vitamin and mineral supplements as recommended by your health care provider. What happens during an annual well check? The services and screenings done by your health care provider during your annual well check will depend on your age, overall health, lifestyle risk factors, and family history of disease. Counseling Your health care provider may ask you questions about your:  Alcohol use.  Tobacco use.  Drug use.  Emotional well-being.  Home and relationship well-being.  Sexual activity.  Eating habits.  Work and work Statistician.  Method of birth control.  Menstrual cycle.  Pregnancy history.  Screening You may have the following tests or measurements:  Height, weight, and BMI.  Blood pressure.  Lipid and cholesterol levels. These may be checked every 5 years, or more frequently if you are over 43 years old.  Skin check.  Lung cancer screening. You may have this screening every year starting at age 69 if you have a 30-pack-year history of smoking and currently smoke or have quit within the past 15 years.  Fecal occult blood test (FOBT) of the stool. You may have this test every year starting at age 20.  Flexible sigmoidoscopy or colonoscopy. You may have a sigmoidoscopy  every 5 years or a colonoscopy every 10 years starting at age 18.  Hepatitis C blood test.  Hepatitis B blood test.  Sexually transmitted disease (STD) testing.  Diabetes screening. This is done by checking your blood sugar (glucose) after you have not eaten for a while (fasting). You may have this done every 1-3 years.  Mammogram. This may be done every 1-2 years. Talk to your health care provider about when you should start having regular mammograms. This may depend on whether you have a family history of breast cancer.  BRCA-related cancer screening. This may be done if you have a family history of breast, ovarian, tubal, or peritoneal cancers.  Pelvic exam and Pap test. This may be done every 3 years starting at age 17. Starting at age 95, this may be done every 5 years if you have a Pap test in combination with an HPV test.  Bone density scan. This is done to screen for osteoporosis. You may have this scan if you are at high risk for osteoporosis.  Discuss your test results, treatment options, and if necessary, the need for more tests with your health care provider. Vaccines Your health care provider may recommend certain vaccines, such as:  Influenza vaccine. This is recommended every year.  Tetanus, diphtheria, and acellular pertussis (Tdap, Td) vaccine. You may need a Td booster every 10 years.  Varicella vaccine. You may need this if you have not been vaccinated.  Zoster vaccine. You may need this after age 15.  Measles, mumps, and rubella (MMR) vaccine. You may need at least one dose of MMR if you were born in 1957 or later. You may also need a second dose.  Pneumococcal 13-valent conjugate (PCV13) vaccine. You may need this if you have certain conditions and were not previously vaccinated.  Pneumococcal polysaccharide (PPSV23) vaccine. You may need one or two doses if you smoke cigarettes or if you have certain conditions.  Meningococcal vaccine. You may need this if  you have certain conditions.  Hepatitis A vaccine. You may need this if you have certain conditions or if you travel or work in places where you may be exposed to hepatitis A.  Hepatitis B vaccine. You may need this if you have certain conditions or if you travel or work in places where you may be exposed to hepatitis B.  Haemophilus influenzae type b (Hib) vaccine. You may need this if you have certain conditions.  Talk to your health care provider about which screenings and vaccines you need and how often you need them. This information is not intended to replace advice given to you by your health care provider. Make sure you discuss any questions you have with your health care provider. Document Released: 02/05/2015 Document Revised: 09/29/2015 Document Reviewed: 11/10/2014 Elsevier Interactive Patient Education  [employer].

## 2017-03-07 NOTE — Progress Notes (Signed)
[name] [place],  [address] Phone: [phone] Subjective:      CC: Back pain follow-up  WER:[id number]  Kendra Austin is a 52 y.o. female coming in with complaint of back and neck pain.  Has been doing really well.  Has lost weight.  Patient feels that that may need improvement.  Doing better with dealing with stress.  Working on a regular basis.  Very happy with the results of far.     Past Medical History:  Diagnosis Date  . Arthritis    osteoarthritis; neck, R shoulder  . Atypical chest pain    has had prior cardiac eval in the past/ started 4 years ago/ corrected by back adjustmment  . Complication of anesthesia    Per pt, sensitive to sedation!  . Depression    related to stress from prior job; no medications, hospitalizations or SI  . DUB (dysfunctional uterine bleeding) 07/22/2015  . Essential tremor 07/22/2015   left hand  . Hemorrhoids   . Hyperglycemia    Pre-diabetic per pt!  . Seasonal allergies 07/22/2015  . Vertigo    resolves with chiropratic; and sees Dr. Tamala Julian   Past Surgical History:  Procedure Laterality Date  . GANGLION CYST EXCISION     right wrist  . TONSILLECTOMY AND ADENOIDECTOMY  1971/1972  . UPPER GASTROINTESTINAL ENDOSCOPY     Social History   Socioeconomic History  . Marital status: Single    Spouse name: None  . Number of children: None  . Years of education: None  . Highest education level: None  Social Needs  . Financial resource strain: None  . Food insecurity - worry: None  . Food insecurity - inability: None  . Transportation needs - medical: None  . Transportation needs - non-medical: None  Occupational History  . None  Tobacco Use  . Smoking status: Never Smoker  . Smokeless tobacco: Never Used  Substance and Sexual Activity  . Alcohol use: No    Alcohol/week: 0.0 oz  . Drug use: No  . Sexual activity: None  Other Topics Concern  . None  Social History Narrative     Work or School: Hydrologist; [employer] scientist and pedagogy       Home Situation:       Spiritual Beliefs:       Lifestyle: no regular exercise; diet is pretty good - avoids gluten         Allergies  Allergen Reactions  . Benadryl [Diphenhydramine Hcl (Sleep)]     Dizzy, extremely drowsy, can cause a lot effects per pt!  Tori Milks [Naproxen Sodium]     Heart palpitations - can take ibuprofen  . Flexeril [Cyclobenzaprine] Other (See Comments)    Makes her extremely tired  . Meloxicam     Made her depressed   Family History  Problem Relation Age of Onset  . Diabetes Mother   . Macular degeneration Mother   . Heart disease Father   . Diabetes Father   . Macular degeneration Maternal Grandmother      Past medical history, social, surgical and family history all reviewed in electronic medical record.  No pertanent information unless stated regarding to the chief complaint.   Review of Systems:Review of systems updated and as accurate as of 03/08/17  No headache, visual changes, nausea, vomiting, diarrhea, constipation, dizziness, abdominal pain, skin rash, fevers, chills, night sweats, weight loss, swollen lymph nodes, body aches, joint swelling, muscle aches, chest  pain, shortness of breath, mood changes.   Objective  Blood pressure 130/80, pulse 72, height 5\' 5"  (1.651 m), weight 184 lb (83.5 kg), last menstrual period 02/08/2017, SpO2 97 %. Systems examined below as of 03/08/17   General: No apparent distress alert and oriented x3 mood and affect normal, dressed appropriately.  HEENT: Pupils equal, extraocular movements intact  Respiratory: Patient's speak in full sentences and does not appear short of breath  Cardiovascular: No lower extremity edema, non tender, no erythema  Skin: Warm dry intact with no signs of infection or rash on extremities or on axial skeleton.  Abdomen: Soft nontender  Neuro: Cranial nerves II through XII are intact, neurovascularly intact in all  extremities with 2+ DTRs and 2+ pulses.  Lymph: No lymphadenopathy of posterior or anterior cervical chain or axillae bilaterally.  Gait normal with good balance and coordination.  MSK:  Non tender with full range of motion and good stability and symmetric strength and tone of shoulders, elbows, wrist, hip, knee and ankles bilaterally.  Neck: Inspection still mild kyphosis of the upper back but seems to be a little bit improved. No palpable stepoffs. Negative Spurling's maneuver. Mild limitation in rotation of 5 degrees bilaterally Grip strength and sensation normal in bilateral hands Strength good C4 to T1 distribution No sensory change to C4 to T1 Negative Hoffman sign bilaterally Reflexes normal Mild tightness of the trapezius.  Osteopathic findings Cervical C2 flexed rotated and side bent right C4 flexed rotated and side bent left C6 flexed rotated and side bent left T3 extended rotated and side bent right inhaled third rib T9 extended rotated and side bent left L2 flexed rotated and side bent right     Impression and Recommendations:     This case required medical decision making of moderate complexity.      Note: This dictation was prepared with Dragon dictation along with smaller phrase technology. Any transcriptional errors that result from this process are unintentional.

## 2017-03-08 ENCOUNTER — Ambulatory Visit: Payer: BC Managed Care – PPO | Admitting: Family Medicine

## 2017-03-08 ENCOUNTER — Encounter: Payer: Self-pay | Admitting: Family Medicine

## 2017-03-08 VITALS — BP 130/80 | HR 72 | Ht 65.0 in | Wt 184.0 lb

## 2017-03-08 DIAGNOSIS — M999 Biomechanical lesion, unspecified: Secondary | ICD-10-CM

## 2017-03-08 DIAGNOSIS — R293 Abnormal posture: Secondary | ICD-10-CM

## 2017-03-08 NOTE — Patient Instructions (Signed)
Good to see you  I am proud of you  Keep it up  See me again in 3-4 months!

## 2017-03-08 NOTE — Assessment & Plan Note (Signed)

## 2017-03-08 NOTE — Assessment & Plan Note (Signed)
Patient has been doing remarkably well over the course last 2-1/2 years.  Has made great strides.  Has responded well to osteopathic manipulation and home exercises.  Follow-up again in 3-4 months.

## 2017-05-30 NOTE — Progress Notes (Signed)
HPI:  Using dictation device. Unfortunately this device frequently misinterprets words/phrases.  Kendra Austin is a pleasant 52 y.o. here for follow up. Chronic medical problems summarized below were reviewed for changes. Mild rise in hgba1c last check.   Reports diet and exercise have not been as good recently.  No regular exercise.  Diet not great, though she has tried to cut down on breads and rice and pasta. She has had a lot of stress at work.. Denies CP, SOB, DOE, treatment intolerance or new symptoms.  Diabetes mellitis, BMI 31and HLD:  -lifestyle recommendations advised  GERD: -reports chronic, intermittent, 2ndary to overeating -reports eval with GI and EGD about in 2016 ok -took ppi in the past but it made her anxious -trying to avoid triggers and takeszantac at times  Essential Tremor: - chronic, everyone in her family has this -action tremor, not interfering with ativities  DUB: -saw gyn in the past, on OCPs -now seeing gyn here and reports did her gyn exam   ROS: See pertinent positives and negatives per HPI.  Past Medical History:  Diagnosis Date  . Arthritis    osteoarthritis; neck, R shoulder  . Atypical chest pain    has had prior cardiac eval in the past/ started 4 years ago/ corrected by back adjustmment  . Complication of anesthesia    Per pt, sensitive to sedation!  . Depression    related to stress from prior job; no medications, hospitalizations or SI  . DUB (dysfunctional uterine bleeding) 07/22/2015  . Essential tremor 07/22/2015   left hand  . Hemorrhoids   . Hyperglycemia    Pre-diabetic per pt!  . Seasonal allergies 07/22/2015  . Vertigo    resolves with chiropratic; and sees Dr. Tamala Julian    Past Surgical History:  Procedure Laterality Date  . GANGLION CYST EXCISION     right wrist  . TONSILLECTOMY AND ADENOIDECTOMY  1971/1972  . UPPER GASTROINTESTINAL ENDOSCOPY      Family History  Problem Relation Age of Onset  . Diabetes  Mother   . Macular degeneration Mother   . Heart disease Father   . Diabetes Father   . Macular degeneration Maternal Grandmother     SOCIAL HX: See HPI   Current Outpatient Medications:  .  Cholecalciferol (VITAMIN D PO), Take by mouth., Disp: , Rfl:  .  CRYSELLE-28 0.3-30 MG-MCG tablet, Take 1 tablet by mouth daily., Disp: , Rfl: 12 .  fluticasone (FLONASE) 50 MCG/ACT nasal spray, Place 2 sprays into both nostrils daily., Disp: , Rfl:  .  LUTEIN PO, Take by mouth., Disp: , Rfl:  .  Multiple Vitamins-Minerals (MULTIVITAMIN ADULT PO), Take by mouth., Disp: , Rfl:  .  Omega-3 Fatty Acids (FISH OIL PO), Take by mouth., Disp: , Rfl:  .  TURMERIC PO, Take by mouth., Disp: , Rfl:   Current Facility-Administered Medications:  .  0.9 %  sodium chloride infusion, 500 mL, Intravenous, Continuous, Danis, Estill Cotta III, MD  EXAM:  Vitals:   05/31/17 0801  BP: 100/80  Pulse: 75  Temp: 98.3 F (36.8 C)    Body mass index is 30.55 kg/m.  GENERAL: vitals reviewed and listed above, alert, oriented, appears well hydrated and in no acute distress  HEENT: atraumatic, conjunttiva clear, no obvious abnormalities on inspection of external nose and ears  NECK: no obvious masses on inspection  LUNGS: clear to auscultation bilaterally, no wheezes, rales or rhonchi, good air movement  CV: HRRR, no peripheral edema  MS:  moves all extremities without noticeable abnormality  PSYCH: pleasant and cooperative, no obvious depression or anxiety  ASSESSMENT AND PLAN:  Discussed the following assessment and plan:  Hyperglycemia - Plan: Basic metabolic panel, Hemoglobin A1c  Hyperlipidemia, unspecified hyperlipidemia type  BMI 30.0-30.9,adult - obesity  Discussed implications of elevated blood sugar at last visit, possibility of diabetes if-hemoglobin A1c again elevated, discussed implications of diabetes, treatment options, risks Recommended a lifelong commitment to a healthy low sugar whole  foods based diet and regular aerobic exercise Discussed risk benefits of metformin and advised her to consider Recheck labs today Follow-up in about 4 months, sooner as needed   Patient Instructions  BEFORE YOU LEAVE: -labs -follow up: 4 months  We have ordered labs or studies at this visit. It can take up to 1-2 weeks for results and processing. IF results require follow up or explanation, we will call you with instructions. Clinically stable results will be released to your [facility]. If you have not heard from [place] or cannot find your results in [facility] in 2 weeks please contact our office at [phone].  If you are not yet signed up for [facility], please consider signing up.  Consider Metformin for blood sugar and weight reduction.   We recommend the following healthy lifestyle for LIFE: 1) Small portions. But, make sure to get regular (at least 3 per day), healthy meals and small healthy snacks if needed.  2) Eat a healthy clean diet.   TRY TO EAT: -at least 5-7 servings of low sugar, colorful, and nutrient rich vegetables per day (not corn, potatoes or bananas.) -berries are the best choice if you wish to eat fruit (only eat small amounts if trying to reduce weight)  -lean meets (fish, white meat of chicken or [place]) -vegan proteins for some meals - beans or tofu, whole grains, nuts and seeds -Replace bad fats with good fats - good fats include: fish, nuts and seeds, canola oil, olive oil -small amounts of low fat or non fat dairy -small amounts of100 % whole grains - check the lables -drink plenty of water  AVOID: -SUGAR, sweets, anything with added sugar, corn syrup or sweeteners - must read labels as even foods advertised as "healthy" often are loaded with sugar -if you must have a sweetener, small amounts of stevia may be best -sweetened beverages and artificially sweetened beverages -simple starches (rice, bread, potatoes, pasta, chips, etc - small amounts of 100% whole  grains are ok) -red meat, pork, butter -fried foods, fast food, processed food, excessive dairy, eggs and coconut.  3)Get at least 150 minutes of sweaty aerobic exercise per week.  4)Reduce stress - consider counseling, meditation and relaxation to balance other aspects of your life.         [name], DO

## 2017-05-31 ENCOUNTER — Encounter: Payer: Self-pay | Admitting: Family Medicine

## 2017-05-31 ENCOUNTER — Ambulatory Visit: Payer: BC Managed Care – PPO | Admitting: Family Medicine

## 2017-05-31 VITALS — BP 100/80 | HR 75 | Temp 98.3°F | Ht 65.0 in | Wt 183.6 lb

## 2017-05-31 DIAGNOSIS — Z6830 Body mass index (BMI) 30.0-30.9, adult: Secondary | ICD-10-CM

## 2017-05-31 DIAGNOSIS — R739 Hyperglycemia, unspecified: Secondary | ICD-10-CM | POA: Diagnosis not present

## 2017-05-31 DIAGNOSIS — E785 Hyperlipidemia, unspecified: Secondary | ICD-10-CM

## 2017-05-31 LAB — BASIC METABOLIC PANEL
BUN: 11 mg/dL (ref 6–23)
CALCIUM: 9.3 mg/dL (ref 8.4–10.5)
CO2: 29 mEq/L (ref 19–32)
CREATININE: 0.78 mg/dL (ref 0.40–1.20)
Chloride: 104 mEq/L (ref 96–112)
GFR: 82.42 mL/min (ref >60.00)
GLUCOSE: 120 mg/dL — AB (ref 70–99)
Potassium: 4.1 mEq/L (ref 3.5–5.1)
SODIUM: 140 meq/L (ref 135–145)

## 2017-05-31 LAB — HEMOGLOBIN A1C: Hgb A1c MFr Bld: 6.9 % — ABNORMAL HIGH (ref 4.6–6.5)

## 2017-05-31 NOTE — Patient Instructions (Signed)
BEFORE YOU LEAVE: -labs -follow up: 4 months  We have ordered labs or studies at this visit. It can take up to 1-2 weeks for results and processing. IF results require follow up or explanation, we will call you with instructions. Clinically stable results will be released to your [facility]. If you have not heard from [place] or cannot find your results in [facility] in 2 weeks please contact our office at [phone].  If you are not yet signed up for [facility], please consider signing up.  Consider Metformin for blood sugar and weight reduction.   We recommend the following healthy lifestyle for LIFE: 1) Small portions. But, make sure to get regular (at least 3 per day), healthy meals and small healthy snacks if needed.  2) Eat a healthy clean diet.   TRY TO EAT: -at least 5-7 servings of low sugar, colorful, and nutrient rich vegetables per day (not corn, potatoes or bananas.) -berries are the best choice if you wish to eat fruit (only eat small amounts if trying to reduce weight)  -lean meets (fish, white meat of chicken or [place]) -vegan proteins for some meals - beans or tofu, whole grains, nuts and seeds -Replace bad fats with good fats - good fats include: fish, nuts and seeds, canola oil, olive oil -small amounts of low fat or non fat dairy -small amounts of100 % whole grains - check the lables -drink plenty of water  AVOID: -SUGAR, sweets, anything with added sugar, corn syrup or sweeteners - must read labels as even foods advertised as "healthy" often are loaded with sugar -if you must have a sweetener, small amounts of stevia may be best -sweetened beverages and artificially sweetened beverages -simple starches (rice, bread, potatoes, pasta, chips, etc - small amounts of 100% whole grains are ok) -red meat, pork, butter -fried foods, fast food, processed food, excessive dairy, eggs and coconut.  3)Get at least 150 minutes of sweaty aerobic exercise per week.  4)Reduce stress -  consider counseling, meditation and relaxation to balance other aspects of your life.

## 2017-06-05 ENCOUNTER — Telehealth: Payer: Self-pay | Admitting: Family Medicine

## 2017-06-05 NOTE — Telephone Encounter (Signed)
Copied from [place] [phone]. Topic: Quick Communication - See Telephone Encounter >> Jun 05, 2017  2:09 PM Bea Graff, NT wrote: CRM for notification. See Telephone encounter for: 06/05/17. Pt calling back to get lab results.

## 2017-06-05 NOTE — Telephone Encounter (Signed)
Pt given results and documented in result note 

## 2017-06-14 ENCOUNTER — Ambulatory Visit: Payer: BC Managed Care – PPO | Admitting: Family Medicine

## 2017-06-14 ENCOUNTER — Encounter: Payer: Self-pay | Admitting: Family Medicine

## 2017-06-14 VITALS — BP 100/70 | HR 70 | Temp 98.3°F | Ht 65.0 in | Wt 181.9 lb

## 2017-06-14 DIAGNOSIS — M25512 Pain in left shoulder: Secondary | ICD-10-CM | POA: Diagnosis not present

## 2017-06-14 DIAGNOSIS — M79601 Pain in right arm: Secondary | ICD-10-CM | POA: Diagnosis not present

## 2017-06-14 DIAGNOSIS — M9907 Segmental and somatic dysfunction of upper extremity: Secondary | ICD-10-CM

## 2017-06-14 DIAGNOSIS — R293 Abnormal posture: Secondary | ICD-10-CM | POA: Diagnosis not present

## 2017-06-14 NOTE — Patient Instructions (Signed)
BEFORE YOU LEAVE: -RTC exercises -follow up: 1 months  Home exercises 4 days per week  Heat, topical menthol as needed for pain  Ibuprofen or aleve cautiously as needed for pain  Sports med as scheduled for OMT there (or you can do OMT here)  Healthy low sugar diet, plenty of water and regular exercise  I hope you are feeling better soon! Seek care sooner if your symptoms worsen, new concerns arise or you are not improving with treatment.

## 2017-06-14 NOTE — Progress Notes (Signed)
HPI:  Using dictation device. Unfortunately this device frequently misinterprets words/phrases.  Acute visit for L shoulder pain: -started 1 week ago -sharp pain ant L shoulder with certain movements -seeing sports med next week for OMT -ibuprofen helped yesterday and feeling better today -can not think of inciting event -also some pain in R distal biceps only with certain movements - not bad -no fevers, malaise, weakness, numbness, head pain, neck stiffness  ROS: See pertinent positives and negatives per HPI.  Past Medical History:  Diagnosis Date  . Arthritis    osteoarthritis; neck, R shoulder  . Atypical chest pain    has had prior cardiac eval in the past/ started 4 years ago/ corrected by back adjustmment  . Complication of anesthesia    Per pt, sensitive to sedation!  . Depression    related to stress from prior job; no medications, hospitalizations or SI  . DUB (dysfunctional uterine bleeding) 07/22/2015  . Essential tremor 07/22/2015   left hand  . Hemorrhoids   . Hyperglycemia    Pre-diabetic per pt!  . Seasonal allergies 07/22/2015  . Vertigo    resolves with chiropratic; and sees Dr. Tamala Julian    Past Surgical History:  Procedure Laterality Date  . GANGLION CYST EXCISION     right wrist  . TONSILLECTOMY AND ADENOIDECTOMY  1971/1972  . UPPER GASTROINTESTINAL ENDOSCOPY      Family History  Problem Relation Age of Onset  . Diabetes Mother   . Macular degeneration Mother   . Heart disease Father   . Diabetes Father   . Macular degeneration Maternal Grandmother     SOCIAL HX: see hpi   Current Outpatient Medications:  .  Cholecalciferol (VITAMIN D PO), Take by mouth., Disp: , Rfl:  .  CRYSELLE-28 0.3-30 MG-MCG tablet, Take 1 tablet by mouth daily., Disp: , Rfl: 12 .  fluticasone (FLONASE) 50 MCG/ACT nasal spray, Place 2 sprays into both nostrils daily., Disp: , Rfl:  .  LUTEIN PO, Take by mouth., Disp: , Rfl:  .  Multiple Vitamins-Minerals (MULTIVITAMIN  ADULT PO), Take by mouth., Disp: , Rfl:  .  Omega-3 Fatty Acids (FISH OIL PO), Take by mouth., Disp: , Rfl:  .  TURMERIC PO, Take by mouth., Disp: , Rfl:   Current Facility-Administered Medications:  .  0.9 %  sodium chloride infusion, 500 mL, Intravenous, Continuous, Danis, Estill Cotta III, MD  EXAM:  Vitals:   06/14/17 0948  BP: 100/70  Pulse: 70  Temp: 98.3 F (36.8 C)    Body mass index is 30.27 kg/m.  GENERAL: vitals reviewed and listed above, alert, oriented, appears well hydrated and in no acute distress  HEENT: atraumatic, conjunttiva clear, no obvious abnormalities on inspection of external nose and ears  NECK: no obvious masses on inspection  LUNGS: clear to auscultation bilaterally, no wheezes, rales or rhonchi, good air movement  CV: HRRR, no peripheral edema  MS: moves all extremities without noticeable abnormality, not sig deformity on inspection except head/shoulder forward posture, TTP RTC ant attachment to humerus L shoulder, + shaw sign without sig TTP of AC jt, pain with empty can and abd/int rotation against resistance, neg impingement testing, neg apprehensive test, facial restriction in all movement L shoulder mild, TTP R distal biceps attach/tendon, normal strength and sensitivity to light touch throughout in UE bilat, normal ROM head and neck, neg spurling  PSYCH: pleasant and cooperative, no obvious depression or anxiety  ASSESSMENT AND PLAN:  Discussed the following assessment and plan:  Acute pain of left shoulder  Right arm pain  Posture imbalance  Somatic dysfunction of left upper extremity  -likely combination soft tissue strain/RTC pathology mild and possible mild OA and mild adhesive capsulitis L shoulder -discussed treatment options -OMT for facial release (MR) L shoulder - she felt symptoms improved after, HEP, postural exercise, symptomatic care, OMT here or with Dr. Tamala Julian, she plans to keep appt already scheduled next week -follow up 1  month, sooner as needed   Patient Instructions  BEFORE YOU LEAVE: -RTC exercises -follow up: 1 months  Home exercises 4 days per week  Heat, topical menthol as needed for pain  Ibuprofen or aleve cautiously as needed for pain  Sports med as scheduled for OMT there (or you can do OMT here)  Healthy low sugar diet, plenty of water and regular exercise  I hope you are feeling better soon! Seek care sooner if your symptoms worsen, new concerns arise or you are not improving with treatment.     [name], DO

## 2017-06-20 NOTE — Progress Notes (Signed)
[name] [address], [place] [address] Phone: [phone] Subjective:     CC: Neck and shoulder tightness  IRS:[id number]  Kendra Austin is a 52 y.o. female coming in with complaint of neck and shoulder tightness. She feels that she has the tightness due to stress. She did have some myofascial release last week on the left shoulder which did help alleviate her pain. Denies any headaches.  Patient states that has had increasing stress and thinks that that is what is contributing mostly.  No radicular symptoms on the arms or any numbness or weakness     Past Medical History:  Diagnosis Date  . Arthritis    osteoarthritis; neck, R shoulder  . Atypical chest pain    has had prior cardiac eval in the past/ started 4 years ago/ corrected by back adjustmment  . Complication of anesthesia    Per pt, sensitive to sedation!  . Depression    related to stress from prior job; no medications, hospitalizations or SI  . DUB (dysfunctional uterine bleeding) 07/22/2015  . Essential tremor 07/22/2015   left hand  . Hemorrhoids   . Hyperglycemia    Pre-diabetic per pt!  . Seasonal allergies 07/22/2015  . Vertigo    resolves with chiropratic; and sees Dr. Tamala Julian   Past Surgical History:  Procedure Laterality Date  . GANGLION CYST EXCISION     right wrist  . TONSILLECTOMY AND ADENOIDECTOMY  1971/1972  . UPPER GASTROINTESTINAL ENDOSCOPY     Social History   Socioeconomic History  . Marital status: Single    Spouse name: Not on file  . Number of children: Not on file  . Years of education: Not on file  . Highest education level: Not on file  Occupational History  . Not on file  Social Needs  . Financial resource strain: Not on file  . Food insecurity:    Worry: Not on file    Inability: Not on file  . Transportation needs:    Medical: Not on file    Non-medical: Not on file  Tobacco Use  . Smoking status: Never Smoker  . Smokeless tobacco:  Never Used  Substance and Sexual Activity  . Alcohol use: No    Alcohol/week: 0.0 oz  . Drug use: No  . Sexual activity: Not on file  Lifestyle  . Physical activity:    Days per week: Not on file    Minutes per session: Not on file  . Stress: Not on file  Relationships  . Social connections:    Talks on phone: Not on file    Gets together: Not on file    Attends religious service: Not on file    Active member of club or organization: Not on file    Attends meetings of clubs or organizations: Not on file    Relationship status: Not on file  Other Topics Concern  . Not on file  Social History Narrative   Work or School: Hydrologist; teaches spanish and pedagogy       Home Situation:       Spiritual Beliefs:       Lifestyle: no regular exercise; diet is pretty good - avoids gluten         Allergies  Allergen Reactions  . Benadryl [Diphenhydramine Hcl (Sleep)]     Dizzy, extremely drowsy, can cause a lot effects per pt!  Tori Milks [Naproxen Sodium]     Heart palpitations - can  take ibuprofen  . Flexeril [Cyclobenzaprine] Other (See Comments)    Makes her extremely tired  . Meloxicam     Made her depressed   Family History  Problem Relation Age of Onset  . Diabetes Mother   . Macular degeneration Mother   . Heart disease Father   . Diabetes Father   . Macular degeneration Maternal Grandmother      Past medical history, social, surgical and family history all reviewed in electronic medical record.  No pertanent information unless stated regarding to the chief complaint.   Review of Systems:Review of systems updated and as accurate as of 06/21/17  No headache, visual changes, nausea, vomiting, diarrhea, constipation, dizziness, abdominal pain, skin rash, fevers, chills, night sweats, weight loss, swollen lymph nodes, body aches, joint swelling, muscle aches, chest pain, shortness of breath, mood changes.   Objective  Blood pressure 122/80, pulse 85, height 5\' 5"  (1.651 m),  weight 188 lb (85.3 kg), SpO2 98 %. Systems examined below as of 06/21/17   General: No apparent distress alert and oriented x3 mood and affect normal, dressed appropriately.  HEENT: Pupils equal, extraocular movements intact  Respiratory: Patient's speak in full sentences and does not appear short of breath  Cardiovascular: No lower extremity edema, non tender, no erythema  Skin: Warm dry intact with no signs of infection or rash on extremities or on axial skeleton.  Abdomen: Soft nontender  Neuro: Cranial nerves II through XII are intact, neurovascularly intact in all extremities with 2+ DTRs and 2+ pulses.  Lymph: No lymphadenopathy of posterior or anterior cervical chain or axillae bilaterally.  Gait normal with good balance and coordination.  MSK:  Non tender with full range of motion and good stability and symmetric strength and tone of shoulders, elbows, wrist, hip, knee and ankles bilaterally.  Neck: Inspection loss of lordosis with increased kyphosis of the upper thoracic spine. No palpable stepoffs. Negative Spurling's maneuver. Loss of sidebending and rotation. Grip strength and sensation normal in bilateral hands Strength good C4 to T1 distribution No sensory change to C4 to T1 Negative Hoffman sign bilaterally Reflexes normal  Osteopathic findings C2 flexed rotated and side bent right C6 flexed rotated and side bent left T3 extended rotated and side bent right inhaled third rib L2 flexed rotated and side bent right Sacrum right on right    Impression and Recommendations:     This case required medical decision making of moderate complexity.      Note: This dictation was prepared with Dragon dictation along with smaller phrase technology. Any transcriptional errors that result from this process are unintentional.

## 2017-06-21 ENCOUNTER — Ambulatory Visit: Payer: BC Managed Care – PPO | Admitting: Family Medicine

## 2017-06-21 ENCOUNTER — Encounter: Payer: Self-pay | Admitting: Family Medicine

## 2017-06-21 VITALS — BP 122/80 | HR 85 | Ht 65.0 in | Wt 188.0 lb

## 2017-06-21 DIAGNOSIS — R293 Abnormal posture: Secondary | ICD-10-CM

## 2017-06-21 DIAGNOSIS — M999 Biomechanical lesion, unspecified: Secondary | ICD-10-CM

## 2017-06-21 NOTE — Patient Instructions (Signed)
Good to see you  Ice is yoru friend HAve a great trip  Try to avoid lifting anything overhand See em again in 2-3 months

## 2017-06-21 NOTE — Assessment & Plan Note (Signed)
Continue to work on Engineer, building services.  Does scapular dyskinesis noted as well.  Discussed icing regimen and over-the-counter medications.  Follow-up again in 2 to 3 months

## 2017-06-21 NOTE — Assessment & Plan Note (Signed)
Decision today to treat with OMT was based on Physical Exam  After verbal consent patient was treated with HVLA, ME, FPR techniques in cervical, thoracic, lumbar and sacral areas  Patient tolerated the procedure well with improvement in symptoms  Patient given exercises, stretches and lifestyle modifications  See medications in patient instructions if given  Patient will follow up in 8-12 weeks

## 2017-07-24 ENCOUNTER — Encounter: Payer: Self-pay | Admitting: Family Medicine

## 2017-07-24 ENCOUNTER — Ambulatory Visit: Payer: BC Managed Care – PPO | Admitting: Family Medicine

## 2017-07-24 VITALS — BP 124/80 | HR 77 | Temp 98.3°F | Wt 179.6 lb

## 2017-07-24 DIAGNOSIS — J040 Acute laryngitis: Secondary | ICD-10-CM | POA: Diagnosis not present

## 2017-07-24 DIAGNOSIS — R05 Cough: Secondary | ICD-10-CM

## 2017-07-24 DIAGNOSIS — R059 Cough, unspecified: Secondary | ICD-10-CM

## 2017-07-24 MED ORDER — BENZONATATE 100 MG PO CAPS: 100.0000 mg | ORAL_CAPSULE | Freq: Three times a day (TID) | ORAL | 0 refills | Status: DC | PRN

## 2017-07-24 NOTE — Progress Notes (Signed)
HPI:  Using dictation device. Unfortunately this device frequently misinterprets words/phrases.  Acute visit for laryngitis: -started 5-6 days ago -symptoms include nasal congestion, PND, laryngitis, cough -no fevers, SOB, NVD, rash, malaise  ROS: See pertinent positives and negatives per HPI.  Past Medical History:  Diagnosis Date  . Arthritis    osteoarthritis; neck, R shoulder  . Atypical chest pain    has had prior cardiac eval in the past/ started 4 years ago/ corrected by back adjustmment  . Complication of anesthesia    Per pt, sensitive to sedation!  . Depression    related to stress from prior job; no medications, hospitalizations or SI  . DUB (dysfunctional uterine bleeding) 07/22/2015  . Essential tremor 07/22/2015   left hand  . Hemorrhoids   . Hyperglycemia    Pre-diabetic per pt!  . Seasonal allergies 07/22/2015  . Vertigo    resolves with chiropratic; and sees Dr. Tamala Julian    Past Surgical History:  Procedure Laterality Date  . GANGLION CYST EXCISION     right wrist  . TONSILLECTOMY AND ADENOIDECTOMY  1971/1972  . UPPER GASTROINTESTINAL ENDOSCOPY      Family History  Problem Relation Age of Onset  . Diabetes Mother   . Macular degeneration Mother   . Heart disease Father   . Diabetes Father   . Macular degeneration Maternal Grandmother     SOCIAL HX: see hpi   Current Outpatient Medications:  .  benzonatate (TESSALON PERLES) 100 MG capsule, Take 1 capsule (100 mg total) by mouth 3 (three) times daily as needed., Disp: 20 capsule, Rfl: 0 .  Cholecalciferol (VITAMIN D PO), Take by mouth., Disp: , Rfl:  .  CRYSELLE-28 0.3-30 MG-MCG tablet, Take 1 tablet by mouth daily., Disp: , Rfl: 12 .  fluticasone (FLONASE) 50 MCG/ACT nasal spray, Place 2 sprays into both nostrils daily., Disp: , Rfl:  .  LUTEIN PO, Take by mouth., Disp: , Rfl:  .  Multiple Vitamins-Minerals (MULTIVITAMIN ADULT PO), Take by mouth., Disp: , Rfl:  .  Omega-3 Fatty Acids (FISH OIL  PO), Take by mouth., Disp: , Rfl:  .  TURMERIC PO, Take by mouth., Disp: , Rfl:   Current Facility-Administered Medications:  .  0.9 %  sodium chloride infusion, 500 mL, Intravenous, Continuous, Danis, Estill Cotta III, MD  EXAM:  Vitals:   07/24/17 1425  BP: 124/80  Pulse: 77  Temp: 98.3 F (36.8 C)  SpO2: 98%    Body mass index is 29.89 kg/m.  GENERAL: vitals reviewed and listed above, alert, oriented, appears well hydrated and in no acute distress  HEENT: atraumatic, conjunttiva clear, no obvious abnormalities on inspection of external nose and ears, normal appearance of ear canals and TMs, clear nasal congestion, mild post oropharyngeal erythema with PND, no tonsillar edema or exudate, no sinus TTP  NECK: no obvious masses on inspection  LUNGS: clear to auscultation bilaterally, no wheezes, rales or rhonchi, good air movement  CV: HRRR, no peripheral edema  MS: moves all extremities without noticeable abnormality  PSYCH: pleasant and cooperative, no obvious depression or anxiety  ASSESSMENT AND PLAN:  Discussed the following assessment and plan:  Laryngitis  Cough  -we discussed possible serious and likely etiologies, workup and treatment, treatment risks and return precautions - likely viral URI -after this discussion, Saffron opted for symptomatic care, tessalon for cough after discussion risks/benefit -follow up advised as needed -of course, we advised Undra  to return or notify a doctor immediately if symptoms worsen  or persist or new concerns arise.   Patient Instructions  INSTRUCTIONS FOR UPPER RESPIRATORY INFECTION:  -nasal saline wash 2-3 times daily (use prepackaged nasal saline or bottled/distilled water if making your own)   -can use AFRIN nasal spray for drainage and nasal congestion - but do NOT use longer then 3-4 days  -can use tylenol (in no history of liver disease) or ibuprofen (if no history of kidney disease, bowel bleeding or significant  heart disease) as directed for aches and sorethroat  -in the winter time, using a humidifier at night is helpful (please follow cleaning instructions)  -if you are taking a cough medication - use only as directed, may also try a teaspoon of honey to coat the throat and throat lozenges. If given a cough medication with codeine or hydrocodone or other narcotic please be advised that this contains a strong and  potentially addicting medication. Please follow instructions carefully, take as little as possible and only use AS NEEDED for severe cough. Discuss potential side effects with your pharmacy. Please do not drive or operate machinery while taking these types of medications. Please do not take other sedating medications, drugs or alcohol while taking this medication without discussing with your doctor.  -for sore throat, salt water gargles can help  -follow up if you have fevers, facial pain, tooth pain, difficulty breathing or are worsening or symptoms persist longer then expected  Upper Respiratory Infection, Adult An upper respiratory infection (URI) is also known as the common cold. It is often caused by a type of germ (virus). Colds are easily spread (contagious). You can pass it to others by kissing, coughing, sneezing, or drinking out of the same glass. Usually, you get better in 1 to 3  weeks.  However, the cough can last for even longer. HOME CARE   Only take medicine as told by your doctor. Follow instructions provided above.  Drink enough water and fluids to keep your pee (urine) clear or pale yellow.  Get plenty of rest.  Return to work when your temperature is < 100 for 24 hours or as told by your doctor. You may use a face mask and wash your hands to stop your cold from spreading. GET HELP RIGHT AWAY IF:   After the first few days, you feel you are getting worse.  You have questions about your medicine.  You have chills, shortness of breath, or red spit (mucus).  You have  pain in the face for more then 1-2 days, especially when you bend forward.  You have a fever, puffy (swollen) neck, pain when you swallow, or white spots in the back of your throat.  You have a bad headache, ear pain, sinus pain, or chest pain.  You have a high-pitched whistling sound when you breathe in and out (wheezing).  You cough up blood.  You have sore muscles or a stiff neck. MAKE SURE YOU:   Understand these instructions.  Will watch your condition.  Will get help right away if you are not doing well or get worse. Document Released: 06/28/2007 Document Revised: 04/03/2011 Document Reviewed: 04/16/2013 [facility] Patient Information 2015 [place], [place]. This information is not intended to replace advice given to you by your health care provider. Make sure you discuss any questions you have with your health care provider.    [name], DO

## 2017-07-24 NOTE — Patient Instructions (Signed)
INSTRUCTIONS FOR UPPER RESPIRATORY INFECTION:  -nasal saline wash 2-3 times daily (use prepackaged nasal saline or bottled/distilled water if making your own)   -can use AFRIN nasal spray for drainage and nasal congestion - but do NOT use longer then 3-4 days  -can use tylenol (in no history of liver disease) or ibuprofen (if no history of kidney disease, bowel bleeding or significant heart disease) as directed for aches and sorethroat  -in the winter time, using a humidifier at night is helpful (please follow cleaning instructions)  -if you are taking a cough medication - use only as directed, may also try a teaspoon of honey to coat the throat and throat lozenges. If given a cough medication with codeine or hydrocodone or other narcotic please be advised that this contains a strong and  potentially addicting medication. Please follow instructions carefully, take as little as possible and only use AS NEEDED for severe cough. Discuss potential side effects with your pharmacy. Please do not drive or operate machinery while taking these types of medications. Please do not take other sedating medications, drugs or alcohol while taking this medication without discussing with your doctor.  -for sore throat, salt water gargles can help  -follow up if you have fevers, facial pain, tooth pain, difficulty breathing or are worsening or symptoms persist longer then expected  Upper Respiratory Infection, Adult An upper respiratory infection (URI) is also known as the common cold. It is often caused by a type of germ (virus). Colds are easily spread (contagious). You can pass it to others by kissing, coughing, sneezing, or drinking out of the same glass. Usually, you get better in 1 to 3  weeks.  However, the cough can last for even longer. HOME CARE   Only take medicine as told by your doctor. Follow instructions provided above.  Drink enough water and fluids to keep your pee (urine) clear or pale  yellow.  Get plenty of rest.  Return to work when your temperature is < 100 for 24 hours or as told by your doctor. You may use a face mask and wash your hands to stop your cold from spreading. GET HELP RIGHT AWAY IF:   After the first few days, you feel you are getting worse.  You have questions about your medicine.  You have chills, shortness of breath, or red spit (mucus).  You have pain in the face for more then 1-2 days, especially when you bend forward.  You have a fever, puffy (swollen) neck, pain when you swallow, or white spots in the back of your throat.  You have a bad headache, ear pain, sinus pain, or chest pain.  You have a high-pitched whistling sound when you breathe in and out (wheezing).  You cough up blood.  You have sore muscles or a stiff neck. MAKE SURE YOU:   Understand these instructions.  Will watch your condition.  Will get help right away if you are not doing well or get worse. Document Released: 06/28/2007 Document Revised: 04/03/2011 Document Reviewed: 04/16/2013 [facility] Patient Information 2015 [place], [place]. This information is not intended to replace advice given to you by your health care provider. Make sure you discuss any questions you have with your health care provider.

## 2017-07-31 ENCOUNTER — Ambulatory Visit: Payer: BC Managed Care – PPO | Admitting: Family Medicine

## 2017-08-13 ENCOUNTER — Ambulatory Visit: Payer: BC Managed Care – PPO | Admitting: Family Medicine

## 2017-08-27 NOTE — Progress Notes (Signed)
[facility] [address], [place] [address] Phone: [phone] Subjective:       CC: right shoulder pain   VOZ:[id number]  Kendra Austin is a 52 y.o. female coming in with complaint of right shoulder and elbow pain. She said that her back is doing better. She did perform some stretches and heavy lifting. Her shoulder pain continues but is intermittent. She also has constant right elbow pain.  Patient states very mild overall blood.  Patient states the back pain seems to be responding fairly well though.     Past Medical History:  Diagnosis Date  . Arthritis    osteoarthritis; neck, R shoulder  . Atypical chest pain    has had prior cardiac eval in the past/ started 4 years ago/ corrected by back adjustmment  . Complication of anesthesia    Per pt, sensitive to sedation!  . Depression    related to stress from prior job; no medications, hospitalizations or SI  . DUB (dysfunctional uterine bleeding) 07/22/2015  . Essential tremor 07/22/2015   left hand  . Hemorrhoids   . Hyperglycemia    Pre-diabetic per pt!  . Seasonal allergies 07/22/2015  . Vertigo    resolves with chiropratic; and sees Dr. Tamala Julian   Past Surgical History:  Procedure Laterality Date  . GANGLION CYST EXCISION     right wrist  . TONSILLECTOMY AND ADENOIDECTOMY  1971/1972  . UPPER GASTROINTESTINAL ENDOSCOPY     Social History   Socioeconomic History  . Marital status: Single    Spouse name: Not on file  . Number of children: Not on file  . Years of education: Not on file  . Highest education level: Not on file  Occupational History  . Not on file  Social Needs  . Financial resource strain: Not on file  . Food insecurity:    Worry: Not on file    Inability: Not on file  . Transportation needs:    Medical: Not on file    Non-medical: Not on file  Tobacco Use  . Smoking status: Never Smoker  . Smokeless tobacco: Never Used  Substance and Sexual Activity  .  Alcohol use: No    Alcohol/week: 0.0 oz  . Drug use: No  . Sexual activity: Not on file  Lifestyle  . Physical activity:    Days per week: Not on file    Minutes per session: Not on file  . Stress: Not on file  Relationships  . Social connections:    Talks on phone: Not on file    Gets together: Not on file    Attends religious service: Not on file    Active member of club or organization: Not on file    Attends meetings of clubs or organizations: Not on file    Relationship status: Not on file  Other Topics Concern  . Not on file  Social History Narrative   Work or School: Hydrologist; teaches spanish and pedagogy       Home Situation:       Spiritual Beliefs:       Lifestyle: no regular exercise; diet is pretty good - avoids gluten         Allergies  Allergen Reactions  . Benadryl [Diphenhydramine Hcl (Sleep)]     Dizzy, extremely drowsy, can cause a lot effects per pt!  Tori Milks [Naproxen Sodium]     Heart palpitations - can take ibuprofen  . Flexeril [Cyclobenzaprine] Other (  See Comments)    Makes her extremely tired  . Meloxicam     Made her depressed   Family History  Problem Relation Age of Onset  . Diabetes Mother   . Macular degeneration Mother   . Heart disease Father   . Diabetes Father   . Macular degeneration Maternal Grandmother      Past medical history, social, surgical and family history all reviewed in electronic medical record.  No pertanent information unless stated regarding to the chief complaint.   Review of Systems:Review of systems updated and as accurate as of 08/28/17  No headache, visual changes, nausea, vomiting, diarrhea, constipation, dizziness, abdominal pain, skin rash, fevers, chills, night sweats, weight loss, swollen lymph nodes, body aches, joint swelling, muscle aches, chest pain, shortness of breath, mood changes.  Positive muscle aches  Objective  Blood pressure 120/82, pulse 82, height 5\' 5"  (1.651 m), weight 179 lb (81.2 kg),  SpO2 98 %. Systems examined below as of 08/28/17   General: No apparent distress alert and oriented x3 mood and affect normal, dressed appropriately.  HEENT: Pupils equal, extraocular movements intact  Respiratory: Patient's speak in full sentences and does not appear short of breath  Cardiovascular: No lower extremity edema, non tender, no erythema  Skin: Warm dry intact with no signs of infection or rash on extremities or on axial skeleton.  Abdomen: Soft nontender  Neuro: Cranial nerves II through XII are intact, neurovascularly intact in all extremities with 2+ DTRs and 2+ pulses.  Lymph: No lymphadenopathy of posterior or anterior cervical chain or axillae bilaterally.  Gait normal with good balance and coordination.  MSK:  Non tender with full range of motion and good stability and symmetric strength and tone of shoulders,  wrist, hip, knee and ankles bilaterally.  Elbow: Right Unremarkable to inspection. Range of motion full pronation, supination, flexion, extension. Strength is full to all of the above directions Stable to varus, valgus stress. Negative moving valgus stress test. Mild tenderness over the lateral epicondylar region Ulnar nerve does not sublux. Negative cubital tunnel Tinel's. Contralateral elbow unremarkable  Neck: Inspection unremarkable. No palpable stepoffs. Negative Spurling's maneuver. Full neck range of motion Grip strength and sensation normal in bilateral hands Strength good C4 to T1 distribution No sensory change to C4 to T1 Negative Hoffman sign bilaterally Reflexes normal Tightness of the right trapezius  Osteopathic findings C2 flexed rotated and side bent right C6 flexed rotated and side bent left T4 extended rotated and side bent left L3 flexed rotated and side bent right Sacrum right on right    Impression and Recommendations:     This case required medical decision making of moderate complexity.      Note: This dictation was  prepared with Dragon dictation along with smaller phrase technology. Any transcriptional errors that result from this process are unintentional.

## 2017-08-28 ENCOUNTER — Ambulatory Visit: Payer: BC Managed Care – PPO | Admitting: Family Medicine

## 2017-08-28 ENCOUNTER — Encounter: Payer: Self-pay | Admitting: Family Medicine

## 2017-08-28 VITALS — BP 120/82 | HR 82 | Ht 65.0 in | Wt 179.0 lb

## 2017-08-28 DIAGNOSIS — R293 Abnormal posture: Secondary | ICD-10-CM | POA: Diagnosis not present

## 2017-08-28 DIAGNOSIS — M999 Biomechanical lesion, unspecified: Secondary | ICD-10-CM

## 2017-08-28 DIAGNOSIS — M7711 Lateral epicondylitis, right elbow: Secondary | ICD-10-CM

## 2017-08-28 NOTE — Assessment & Plan Note (Signed)
Discussed with patient about proper posture.  We discussed icing regimen, responds well to manipulation.  No significant change in management.  Follow-up again in 3 months

## 2017-08-28 NOTE — Assessment & Plan Note (Signed)
Lateral Epicondylitis: Elbow anatomy was reviewed, and tendinopathy was explained.  Pt. given a formal rehab program. Series of concentric and eccentric exercises should be done starting with no weight, work up to 1 lb, hammer, etc.  Use counterforce strap if working or using hands.  Formal PT would be beneficial. Emphasized stretching an cross-friction massage Emphasized proper palms up lifting biomechanics to unload ECRB Return to clinic in 4 weeks

## 2017-08-28 NOTE — Assessment & Plan Note (Signed)
Decision today to treat with OMT was based on Physical Exam  After verbal consent patient was treated with HVLA, ME, FPR techniques in cervical, thoracic, rib lumbar and sacral areas  Patient tolerated the procedure well with improvement in symptoms  Patient given exercises, stretches and lifestyle modifications  See medications in patient instructions if given  Patient will follow up in 4-6 weeks 

## 2017-08-28 NOTE — Patient Instructions (Signed)
Good to see you  Alvera Singh is your friend.  I am impressed  Keep it up  See me again in 4 weeks Exercises 3 times a week.  Avoid overhand lifting.  Wrist brace day and night for 1 week then nightly for 2 weeks See me again in 3 months

## 2017-09-30 NOTE — Progress Notes (Signed)
HPI:  Using dictation device. Unfortunately this device frequently misinterprets words/phrases.  Kendra Austin is a pleasant 52 y.o. here for follow up. Chronic medical problems summarized below were reviewed for changes. Reports doing well. Has improved diet in terms of sweets and carbs. Is starting water exercise today. Occ RUQ abd pain - mild, many years, not worsening, rare. Occ rare brief L ankle pain - sharp for 1 second then resolves on going for a few weeks.  Declines evaluation abd pain for this.  Denies CP, SOB, DOE, treatment intolerance or new symptoms. Laryngitis resolved. Due for flu vaccine - declined, plans to do eslewhere Recheck diabetes lab  Diabetes mellitis, BMI 31and HLD:  -lifestyle recommendations advised  GERD: -reports chronic, intermittent, 2ndary to overeating -reports eval with GI and EGD about in 2016 ok -took ppi in the past but it made her anxious -trying to avoid triggers and takeszantac at times  Essential Tremor: - chronic, everyone in her family has this -action tremor, not interfering with ativities  DUB: -saw gyn in the past, on OCPs -now seeing gyn here and reportsdid her gyn exam    ROS: See pertinent positives and negatives per HPI.  Past Medical History:  Diagnosis Date  . Arthritis    osteoarthritis; neck, R shoulder  . Atypical chest pain    has had prior cardiac eval in the past/ started 4 years ago/ corrected by back adjustmment  . Complication of anesthesia    Per pt, sensitive to sedation!  . Depression    related to stress from prior job; no medications, hospitalizations or SI  . DUB (dysfunctional uterine bleeding) 07/22/2015  . Essential tremor 07/22/2015   left hand  . Hemorrhoids   . Hyperglycemia    Pre-diabetic per pt!  . Seasonal allergies 07/22/2015  . Vertigo    resolves with chiropratic; and sees Dr. Tamala Julian    Past Surgical History:  Procedure Laterality Date  . GANGLION CYST EXCISION     right wrist   . TONSILLECTOMY AND ADENOIDECTOMY  1971/1972  . UPPER GASTROINTESTINAL ENDOSCOPY      Family History  Problem Relation Age of Onset  . Diabetes Mother   . Macular degeneration Mother   . Heart disease Father   . Diabetes Father   . Macular degeneration Maternal Grandmother     SOCIAL HX: see hpi   Current Outpatient Medications:  .  Cholecalciferol (VITAMIN D PO), Take by mouth., Disp: , Rfl:  .  CRYSELLE-28 0.3-30 MG-MCG tablet, Take 1 tablet by mouth daily., Disp: , Rfl: 12 .  fluticasone (FLONASE) 50 MCG/ACT nasal spray, Place 2 sprays into both nostrils daily., Disp: , Rfl:  .  LUTEIN PO, Take by mouth., Disp: , Rfl:  .  Multiple Vitamins-Minerals (MULTIVITAMIN ADULT PO), Take by mouth., Disp: , Rfl:  .  Omega-3 Fatty Acids (FISH OIL PO), Take by mouth., Disp: , Rfl:  .  TURMERIC PO, Take by mouth., Disp: , Rfl:   Current Facility-Administered Medications:  .  0.9 %  sodium chloride infusion, 500 mL, Intravenous, Continuous, Danis, Estill Cotta III, MD  EXAM:  Vitals:   10/02/17 0846  BP: 118/70  Pulse: 78  Temp: 98.3 F (36.8 C)    Body mass index is 29.59 kg/m.  GENERAL: vitals reviewed and listed above, alert, oriented, appears well hydrated and in no acute distress  HEENT: atraumatic, conjunttiva clear, no obvious abnormalities on inspection of external nose and ears  NECK: no obvious masses on inspection  LUNGS: clear to auscultation bilaterally, no wheezes, rales or rhonchi, good air movement  CV: HRRR, no peripheral edema  ABD: BS+, soft, NTTP, no rebound or guarding, neg murphy  MS: moves all extremities without noticeable abnormality, no appreciable swelling, erythema, warmth, TTP or deformity ankle, neg ant/pos drawer, NV intact distal  PSYCH: pleasant and cooperative, no obvious depression or anxiety  ASSESSMENT AND PLAN:  Discussed the following assessment and plan:  Type 2 diabetes mellitus without complication, without long-term current use  of insulin (HCC)  Gastroesophageal reflux disease, esophagitis presence not specified  Overweight (BMI 25.0-29.9)  -congratulated and supported on lifestyle changes -advised lifelong commitment to healthy diet and regular exercise -discussed medications for diabetes, risks, benefits, she want to try metformin if hgba1c > 7 - pending -discussed potential etiologies abd pain and the ankle pain and offered evaluation, she declined, exam benign today - advised [place] for abd pain, she declined -Patient advised to return or notify a doctor immediately if symptoms worsen or persist or new concerns arise.  Patient Instructions  BEFORE YOU LEAVE: -lab -follow up: 4 months  Start the exercise! Keep up the healthier diet!  Will consider the Metformin we discussed pending the lab results - if needed.  We have ordered labs or studies at this visit. It can take up to 1-2 weeks for results and processing. IF results require follow up or explanation, we will call you with instructions. Clinically stable results will be released to your [facility]. If you have not heard from [place] or cannot find your results in [facility] in 2 weeks please contact our office at [phone].  If you are not yet signed up for [facility], please consider signing up.           [name], DO

## 2017-10-02 ENCOUNTER — Ambulatory Visit: Payer: BC Managed Care – PPO | Admitting: Family Medicine

## 2017-10-02 ENCOUNTER — Encounter: Payer: Self-pay | Admitting: Family Medicine

## 2017-10-02 VITALS — BP 118/70 | HR 78 | Temp 98.3°F | Ht 65.0 in | Wt 177.8 lb

## 2017-10-02 DIAGNOSIS — E663 Overweight: Secondary | ICD-10-CM

## 2017-10-02 DIAGNOSIS — K219 Gastro-esophageal reflux disease without esophagitis: Secondary | ICD-10-CM

## 2017-10-02 DIAGNOSIS — E119 Type 2 diabetes mellitus without complications: Secondary | ICD-10-CM

## 2017-10-02 DIAGNOSIS — M79672 Pain in left foot: Secondary | ICD-10-CM

## 2017-10-02 DIAGNOSIS — R109 Unspecified abdominal pain: Secondary | ICD-10-CM

## 2017-10-02 LAB — HEMOGLOBIN A1C: Hgb A1c MFr Bld: 6.2 % (ref 4.6–6.5)

## 2017-10-02 NOTE — Patient Instructions (Signed)
BEFORE YOU LEAVE: -lab -follow up: 4 months  Start the exercise! Keep up the healthier diet!  Will consider the Metformin we discussed pending the lab results - if needed.  We have ordered labs or studies at this visit. It can take up to 1-2 weeks for results and processing. IF results require follow up or explanation, we will call you with instructions. Clinically stable results will be released to your [facility]. If you have not heard from [place] or cannot find your results in [facility] in 2 weeks please contact our office at [phone].  If you are not yet signed up for [facility], please consider signing up.

## 2017-10-04 ENCOUNTER — Encounter: Payer: Self-pay | Admitting: Family Medicine

## 2017-10-04 ENCOUNTER — Ambulatory Visit: Payer: BC Managed Care – PPO | Admitting: Family Medicine

## 2017-10-04 VITALS — BP 120/82 | HR 70 | Temp 98.3°F | Ht 65.0 in

## 2017-10-04 DIAGNOSIS — K589 Irritable bowel syndrome without diarrhea: Secondary | ICD-10-CM | POA: Diagnosis not present

## 2017-10-04 DIAGNOSIS — R110 Nausea: Secondary | ICD-10-CM

## 2017-10-04 DIAGNOSIS — K5900 Constipation, unspecified: Secondary | ICD-10-CM

## 2017-10-04 NOTE — Patient Instructions (Signed)
BEFORE YOU LEAVE: -follow up: 2-4 weeks  Start probiotic - align or culturelle.  Mirilax daily for 3-5 days or until daily soft bowel movement.  No dairy or red meat for 1-2 weeks.  I hope you are feeling better soon! Seek care promptly if your symptoms worsen, new concerns arise or you are not improving with treatment.

## 2017-10-04 NOTE — Progress Notes (Signed)
HPI:  Using dictation device. Unfortunately this device frequently misinterprets words/phrases.  Acute visit for bowel issues: -started 5 days ago, but intermittent similar symptoms chronically -nausea, diarrhea x 1 days - several episode watery diarrhea -now with some constipation -no fevers, vomiting, abd pain, acid reflux, hematochezia, melena -schedule "crazy" right now so has been very busy  ROS: See pertinent positives and negatives per HPI.  Past Medical History:  Diagnosis Date  . Arthritis    osteoarthritis; neck, R shoulder  . Atypical chest pain    has had prior cardiac eval in the past/ started 4 years ago/ corrected by back adjustmment  . Complication of anesthesia    Per pt, sensitive to sedation!  . Depression    related to stress from prior job; no medications, hospitalizations or SI  . DUB (dysfunctional uterine bleeding) 07/22/2015  . Essential tremor 07/22/2015   left hand  . Hemorrhoids   . Hyperglycemia    Pre-diabetic per pt!  . Seasonal allergies 07/22/2015  . Vertigo    resolves with chiropratic; and sees Dr. Tamala Julian    Past Surgical History:  Procedure Laterality Date  . GANGLION CYST EXCISION     right wrist  . TONSILLECTOMY AND ADENOIDECTOMY  1971/1972  . UPPER GASTROINTESTINAL ENDOSCOPY      Family History  Problem Relation Age of Onset  . Diabetes Mother   . Macular degeneration Mother   . Heart disease Father   . Diabetes Father   . Macular degeneration Maternal Grandmother     SOCIAL HX: see hpi   Current Outpatient Medications:  .  Cholecalciferol (VITAMIN D PO), Take by mouth., Disp: , Rfl:  .  CRYSELLE-28 0.3-30 MG-MCG tablet, Take 1 tablet by mouth daily., Disp: , Rfl: 12 .  fluticasone (FLONASE) 50 MCG/ACT nasal spray, Place 2 sprays into both nostrils daily., Disp: , Rfl:  .  LUTEIN PO, Take by mouth., Disp: , Rfl:  .  Multiple Vitamins-Minerals (MULTIVITAMIN ADULT PO), Take by mouth., Disp: , Rfl:  .  Omega-3 Fatty Acids  (FISH OIL PO), Take by mouth., Disp: , Rfl:  .  TURMERIC PO, Take by mouth., Disp: , Rfl:   Current Facility-Administered Medications:  .  0.9 %  sodium chloride infusion, 500 mL, Intravenous, Continuous, Danis, Estill Cotta III, MD  EXAM:  Vitals:   10/04/17 1523  BP: 120/82  Pulse: 70  Temp: 98.3 F (36.8 C)    Body mass index is 29.59 kg/m.  GENERAL: vitals reviewed and listed above, alert, oriented, appears well hydrated and in no acute distress  HEENT: atraumatic, conjunttiva clear, no obvious abnormalities on inspection of external nose and ears  NECK: no obvious masses on inspection  LUNGS: clear to auscultation bilaterally, no wheezes, rales or rhonchi, good air movement  CV: HRRR, no peripheral edema  ABD: BS+ all 4 quadrants, soft, NTTP, no rebound or guarding  MS: moves all extremities without noticeable abnormality  PSYCH: pleasant and cooperative, no obvious depression or anxiety  ASSESSMENT AND PLAN:  Discussed the following assessment and plan:  Nausea  Constipation, unspecified constipation type  Irritable bowel syndrome, unspecified type  -we discussed possible serious and likely etiologies, workup and treatment, treatment risks and return precautions - it sounds like she probably has a mild gastroenteritis vs flare in IBS vs other -after this discussion, Kendra Austin opted for trial mirilax for the constipation, no dairy or meat for 1 week, probiotic -follow up advised in 2-4 weeks, sooner if worsening, new concerns or  not improving     Patient Instructions  BEFORE YOU LEAVE: -follow up: 2-4 weeks  Start probiotic - align or culturelle.  Mirilax daily for 3-5 days or until daily soft bowel movement.  No dairy or red meat for 1-2 weeks.  I hope you are feeling better soon! Seek care promptly if your symptoms worsen, new concerns arise or you are not improving with treatment.     Kendra Kern, DO

## 2017-10-23 NOTE — Progress Notes (Signed)
HPI:  Using dictation device. Unfortunately this device frequently misinterprets words/phrases.  Kendra Austin is a pleasant 52 y.o. here for follow up. Chronic medical problems summarized below were reviewed for changes. Nausea and diarrhea last visit.  Reports the nausea has been intermittent, though more persistent the last week or so.  Daily nausea.  Sometimes related to what she eats.  Intermittent constipation and loose bowels.  Denies weight loss, fevers, malaise, melena, hematochezia, acid reflux, abdominal pain.  She is on hormones with her gynecologist and she does not like these.  Had bad PMS this month.  Denies CP, SOB, DOE, treatment intolerance or new symptoms. Reports unfortunately her diet has been a lot worse recently with increased carb intake.  She plans to get back on track.  Did not want to check labs today. Due for urine micro, ? Flu vaccine -declined  Diabetes mellitis,Hx obesityand HLD:  -lifestyle recommendations advised -BMI under 30 last visit and labs improving   GERD: -reports chronic, intermittent, 2ndary to overeating -reports eval with GI and EGD about in 2016 ok -took ppi in the past but it made her anxious -trying to avoid triggers and takeszantac at times  Essential Tremor: - chronic, everyone in her family has this -action tremor, not interfering with ativities  DUB: -saw gyn in the past, on OCPs -now seeing gyn here and reportsdid her gyn exam  ROS: See pertinent positives and negatives per HPI.  Past Medical History:  Diagnosis Date  . Arthritis    osteoarthritis; neck, R shoulder  . Atypical chest pain    has had prior cardiac eval in the past/ started 4 years ago/ corrected by back adjustmment  . Complication of anesthesia    Per pt, sensitive to sedation!  . Depression    related to stress from prior job; no medications, hospitalizations or SI  . DUB (dysfunctional uterine bleeding) 07/22/2015  . Essential tremor 07/22/2015   left hand  . Hemorrhoids   . Hyperglycemia    Pre-diabetic per pt!  . Seasonal allergies 07/22/2015  . Vertigo    resolves with chiropratic; and sees Dr. Tamala Julian    Past Surgical History:  Procedure Laterality Date  . GANGLION CYST EXCISION     right wrist  . TONSILLECTOMY AND ADENOIDECTOMY  1971/1972  . UPPER GASTROINTESTINAL ENDOSCOPY      Family History  Problem Relation Age of Onset  . Diabetes Mother   . Macular degeneration Mother   . Heart disease Father   . Diabetes Father   . Macular degeneration Maternal Grandmother     SOCIAL HX: See HPI   Current Outpatient Medications:  .  Cholecalciferol (VITAMIN D PO), Take by mouth., Disp: , Rfl:  .  CRYSELLE-28 0.3-30 MG-MCG tablet, Take 1 tablet by mouth daily., Disp: , Rfl: 12 .  fluticasone (FLONASE) 50 MCG/ACT nasal spray, Place 2 sprays into both nostrils daily., Disp: , Rfl:  .  Lactobacillus Rhamnosus, GG, (CULTURELLE PO), Take by mouth daily., Disp: , Rfl:  .  LUTEIN PO, Take by mouth., Disp: , Rfl:  .  Multiple Vitamins-Minerals (MULTIVITAMIN ADULT PO), Take by mouth., Disp: , Rfl:  .  Omega-3 Fatty Acids (FISH OIL PO), Take by mouth., Disp: , Rfl:  .  TURMERIC PO, Take by mouth., Disp: , Rfl:   Current Facility-Administered Medications:  .  0.9 %  sodium chloride infusion, 500 mL, Intravenous, Continuous, Danis, Estill Cotta III, MD  EXAM:  Vitals:   10/25/17 0900  BP: 110/70  Pulse: 73  Temp: 98.5 F (36.9 C)    Body mass index is 29.75 kg/m.  GENERAL: vitals reviewed and listed above, alert, oriented, appears well hydrated and in no acute distress  HEENT: atraumatic, conjunttiva clear, no obvious abnormalities on inspection of external nose and ears  NECK: no obvious masses on inspection  LUNGS: clear to auscultation bilaterally, no wheezes, rales or rhonchi, good air movement  CV: HRRR, no peripheral edema  ABD: BS+, soft, NTTP  MS: moves all extremities without noticeable abnormality  PSYCH:  pleasant and cooperative, no obvious depression or anxiety  ASSESSMENT AND PLAN:  Discussed the following assessment and plan:  Nausea -we discussed possible serious and likely etiologies, workup and treatment, treatment risks and return precautions -He does not think this is acid reflux, though that is a possibility -She has a history of IBS, this also could be a possibility cause for her symptoms -She is on hormones for menstrual issues and this could be contributing as well -after this discussion, Maryn opted for evaluation with Dr. Keith Rake in GI -Recommended a healthy low sugar diet -follow up advised 3-4 months or sooner as needed -of course, we advised Pariss  to return or notify a doctor immediately if symptoms worsen or persist or new concerns arise.  Type 2 diabetes mellitus with hyperglycemia, without long-term current use of insulin ([place]) - Plan: Microalbumin / creatinine urine ratio -Recommend getting back on track with diet, regular exercise -We will check a microalbumin today as this is due, but will hold off on labs until next visit  Hyperlipidemia associated with type 2 diabetes mellitus ([place]) -Lifestyle recommendations, she prefers to avoid medications  Fused flu vaccine here.  -Patient advised to return or notify a doctor immediately if symptoms worsen or persist or new concerns arise.  Patient Instructions  BEFORE YOU LEAVE: -Lab for urine test -follow up: 3 to 4 months  Call your gastroenterology office to see if he can schedule an appointment for the ongoing nausea if any trouble scheduling a visit, please let us know and we can place a referral.   We recommend the following healthy lifestyle for LIFE: 1) Small portions. But, make sure to get regular (at least 3 per day), healthy meals and small healthy snacks if needed.  2) Eat a healthy clean diet.   TRY TO EAT: -at least 5-7 servings of low sugar, colorful, and nutrient rich vegetables per day (not  corn, potatoes or bananas.) -berries are the best choice if you wish to eat fruit (only eat small amounts if trying to reduce weight)  -lean meets (fish, white meat of chicken or [place]) -vegan proteins for some meals - beans or tofu, whole grains, nuts and seeds -Replace bad fats with good fats - good fats include: fish, nuts and seeds, canola oil, olive oil -small amounts of low fat or non fat dairy -small amounts of100 % whole grains - check the lables -drink plenty of water  AVOID: -SUGAR, sweets, anything with added sugar, corn syrup or sweeteners - must read labels as even foods advertised as "healthy" often are loaded with sugar -if you must have a sweetener, small amounts of stevia may be best -sweetened beverages and artificially sweetened beverages -simple starches (rice, bread, potatoes, pasta, chips, etc - small amounts of 100% whole grains are ok) -red meat, pork, butter -fried foods, fast food, processed food, excessive dairy, eggs and coconut.  3)Get at least 150 minutes of sweaty aerobic exercise per week.  4)Reduce  stress - consider counseling, meditation and relaxation to balance other aspects of your life.    [name], DO

## 2017-10-25 ENCOUNTER — Ambulatory Visit: Payer: BC Managed Care – PPO | Admitting: Family Medicine

## 2017-10-25 ENCOUNTER — Encounter: Payer: Self-pay | Admitting: Family Medicine

## 2017-10-25 VITALS — BP 110/70 | HR 73 | Temp 98.5°F | Ht 65.0 in | Wt 178.8 lb

## 2017-10-25 DIAGNOSIS — R110 Nausea: Secondary | ICD-10-CM

## 2017-10-25 DIAGNOSIS — E1165 Type 2 diabetes mellitus with hyperglycemia: Secondary | ICD-10-CM

## 2017-10-25 DIAGNOSIS — E785 Hyperlipidemia, unspecified: Secondary | ICD-10-CM

## 2017-10-25 DIAGNOSIS — E1169 Type 2 diabetes mellitus with other specified complication: Secondary | ICD-10-CM

## 2017-10-25 LAB — MICROALBUMIN / CREATININE URINE RATIO
CREATININE, U: 50.5 mg/dL
Microalb Creat Ratio: 1.4 mg/g (ref 0.0–30.0)
Microalb, Ur: <0.7 mg/dL (ref 0.0–1.9)

## 2017-10-25 NOTE — Patient Instructions (Signed)
BEFORE YOU LEAVE: -Lab for urine test -follow up: 3 to 4 months  Call your gastroenterology office to see if he can schedule an appointment for the ongoing nausea if any trouble scheduling a visit, please let us know and we can place a referral.   We recommend the following healthy lifestyle for LIFE: 1) Small portions. But, make sure to get regular (at least 3 per day), healthy meals and small healthy snacks if needed.  2) Eat a healthy clean diet.   TRY TO EAT: -at least 5-7 servings of low sugar, colorful, and nutrient rich vegetables per day (not corn, potatoes or bananas.) -berries are the best choice if you wish to eat fruit (only eat small amounts if trying to reduce weight)  -lean meets (fish, white meat of chicken or [place]) -vegan proteins for some meals - beans or tofu, whole grains, nuts and seeds -Replace bad fats with good fats - good fats include: fish, nuts and seeds, canola oil, olive oil -small amounts of low fat or non fat dairy -small amounts of100 % whole grains - check the lables -drink plenty of water  AVOID: -SUGAR, sweets, anything with added sugar, corn syrup or sweeteners - must read labels as even foods advertised as "healthy" often are loaded with sugar -if you must have a sweetener, small amounts of stevia may be best -sweetened beverages and artificially sweetened beverages -simple starches (rice, bread, potatoes, pasta, chips, etc - small amounts of 100% whole grains are ok) -red meat, pork, butter -fried foods, fast food, processed food, excessive dairy, eggs and coconut.  3)Get at least 150 minutes of sweaty aerobic exercise per week.  4)Reduce stress - consider counseling, meditation and relaxation to balance other aspects of your life.

## 2017-10-27 ENCOUNTER — Ambulatory Visit: Payer: BC Managed Care – PPO | Admitting: Family Medicine

## 2017-10-27 ENCOUNTER — Encounter: Payer: Self-pay | Admitting: Family Medicine

## 2017-10-27 VITALS — BP 110/68 | HR 74 | Temp 98.6°F | Wt 178.5 lb

## 2017-10-27 DIAGNOSIS — R1013 Epigastric pain: Secondary | ICD-10-CM | POA: Diagnosis not present

## 2017-10-27 MED ORDER — GI COCKTAIL ~~LOC~~: 30.0000 mL | Freq: Once | ORAL | Status: DC

## 2017-10-27 MED ORDER — OMEPRAZOLE 40 MG PO CPDR: 40.0000 mg | DELAYED_RELEASE_CAPSULE | Freq: Every day | ORAL | 3 refills | Status: DC

## 2017-10-27 MED ORDER — SUCRALFATE 1 G PO TABS: 1.0000 g | ORAL_TABLET | Freq: Three times a day (TID) | ORAL | 0 refills | Status: DC

## 2017-10-27 NOTE — Patient Instructions (Addendum)
It does seem that your symptoms are likely due to GERD We will have you use carafate 4x a day for 10 days Also prilosec once a day  If you are not doing better in the next few days please let me or Dr. Maudie Mercury know If you are not doing ok please seek immediate care-

## 2017-10-27 NOTE — Progress Notes (Signed)
Circle at [facility] [address] [place], [place] [address] [phone]  Date:  10/27/2017   Name:  Kendra Austin   DOB:  08/12/1965   MRN:  [id number]  PCP:  [name], DO    Chief Complaint: Gastroesophageal Reflux (epigastric pain that started yesterday all of a sudden, going away very slowly.  feels that these 2 may be related. )   History of Present Illness:  Kendra Austin is a 52 y.o. very pleasant female patient who presents with the following:  Pt of Dr. Maudie Mercury, seen by her just on Thursday - she has had "digestive issues for about a month" and is seeing GI next month Yesterday while eating breakfast she noted a feeling of a "terrible cramp in her diaphragm" that lasted most of the day.  It got better slowly as the day went on   She has epigastric pain and a pain in her throat higher up.  This has been bothering her for about one month. She feels certain that it is heartburn She is not using any meds for heartburn right now . No vomiting, but she had had nausea off and on for a month Diarrhea comes and goes  Eating may worsen her sx depending on what she eats- she is most bothered by spicy foods, and finds herself mostly eating simple carbs as these are easiest on her stomach - however she is afraid that this is not great for her DM   She has a history of DM Lab Results  Component Value Date   HGBA1C 6.2 10/02/2017   She has nausea and indigestion for a month or so  She does not have any history of CAD  She is not having any chest pressure  Patient Active Problem List   Diagnosis Date Noted  . Lateral epicondylitis of right elbow 08/28/2017  . BMI 30.0-30.9,adult 11/23/2016  . Hyperlipidemia 03/27/2016  . Hyperglycemia 03/27/2016  . Essential tremor 07/22/2015  . Seasonal allergies 07/22/2015  . DUB (dysfunctional uterine bleeding) 07/22/2015  . Posture imbalance 11/26/2014  . Nonallopathic lesion of  cervical region 11/26/2014  . Nonallopathic lesion of thoracic region 11/26/2014  . Nonallopathic lesion of lumbosacral region 11/26/2014    Past Medical History:  Diagnosis Date  . Arthritis    osteoarthritis; neck, R shoulder  . Atypical chest pain    has had prior cardiac eval in the past/ started 4 years ago/ corrected by back adjustmment  . Complication of anesthesia    Per pt, sensitive to sedation!  . Depression    related to stress from prior job; no medications, hospitalizations or SI  . DUB (dysfunctional uterine bleeding) 07/22/2015  . Essential tremor 07/22/2015   left hand  . Hemorrhoids   . Hyperglycemia    Pre-diabetic per pt!  . Seasonal allergies 07/22/2015  . Vertigo    resolves with chiropratic; and sees Dr. Tamala Julian    Past Surgical History:  Procedure Laterality Date  . GANGLION CYST EXCISION     right wrist  . TONSILLECTOMY AND ADENOIDECTOMY  1971/1972  . UPPER GASTROINTESTINAL ENDOSCOPY      Social History   Tobacco Use  . Smoking status: Never Smoker  . Smokeless tobacco: Never Used  Substance Use Topics  . Alcohol use: No    Alcohol/week: 0.0 standard drinks  . Drug use: No    Family History  Problem Relation Age of Onset  .  Diabetes Mother   . Macular degeneration Mother   . Heart disease Father   . Diabetes Father   . Macular degeneration Maternal Grandmother     Allergies  Allergen Reactions  . Benadryl [Diphenhydramine Hcl (Sleep)]     Dizzy, extremely drowsy, can cause a lot effects per pt!  Tori Milks [Naproxen Sodium]     Heart palpitations - can take ibuprofen  . Flexeril [Cyclobenzaprine] Other (See Comments)    Makes her extremely tired  . Meloxicam     Made her depressed    Medication list has been reviewed and updated.  Current Outpatient Medications on File Prior to Visit  Medication Sig Dispense Refill  . Cholecalciferol (VITAMIN D PO) Take by mouth.    . CRYSELLE-28 0.3-30 MG-MCG tablet Take 1 tablet by mouth  daily.  12  . fluticasone (FLONASE) 50 MCG/ACT nasal spray Place 2 sprays into both nostrils daily.    . Lactobacillus Rhamnosus, GG, (CULTURELLE PO) Take by mouth daily.    . LUTEIN PO Take by mouth.    . Multiple Vitamins-Minerals (MULTIVITAMIN ADULT PO) Take by mouth.    . Omega-3 Fatty Acids (FISH OIL PO) Take by mouth.    . TURMERIC PO Take by mouth.     Current Facility-Administered Medications on File Prior to Visit  Medication Dose Route Frequency Provider Last Rate Last Dose  . 0.9 %  sodium chloride infusion  500 mL Intravenous Continuous Nelida Meuse III, MD        As per HPI- otherwise negative.    Physical Examination: Vitals:   10/27/17 0959  BP: 110/68  Pulse: 74  Temp: 98.6 F (37 C)  SpO2: 99%   Vitals:   10/27/17 0959  Weight: 178 lb 8 oz (81 kg)   Body mass index is 29.7 kg/m. Ideal Body Weight:    GEN: WDWN, NAD, Non-toxic, A & O x 3, overweight, looks well  HEENT: Atraumatic, Normocephalic. Neck supple. No masses, No LAD. Ears and Nose: No external deformity. CV: RRR, No M/G/R. No JVD. No thrill. No extra heart sounds. PULM: CTA B, no wheezes, crackles, rhonchi. No retractions. No resp. distress. No accessory muscle use. ABD: S, ND, +BS. No rebound. No HSM. She has epigastric tenderness, mild  EXTR: No c/c/e NEURO Normal gait.  PSYCH: Normally interactive. Conversant. Not depressed or anxious appearing.  Calm demeanor.   I had wanted to give pt a GI cocktail but not available at Sat clinic EKG: NSR, no old EKG seen on file  Assessment and Plan: Epigastric pain - Plan: EKG 12-Lead, EKG 12-Lead, sucralfate (CARAFATE) 1 g tablet, omeprazole (PRILOSEC) 40 MG capsule, DISCONTINUED: gi cocktail (Maalox,Lidocaine,Donnatal)  Here today with likely GERD per her sx Unable to try GI cocktail Discussed with pt in detail- certainly GERD seems likely but it is also possible that she has a heart or lung issue that is the culprit. We are limited in our  evaluation here at Saturday clinic-  Offered to have her seen in the ER but she declines We will try a PPI and carafate for her - she will let me know how this works for her and will seek care if not better soon   Signed Lamar Blinks, MD

## 2017-11-23 ENCOUNTER — Encounter: Payer: Self-pay | Admitting: Gastroenterology

## 2017-11-23 ENCOUNTER — Ambulatory Visit: Payer: BC Managed Care – PPO | Admitting: Gastroenterology

## 2017-11-23 VITALS — BP 126/72 | Ht 65.0 in | Wt 180.0 lb

## 2017-11-23 DIAGNOSIS — R110 Nausea: Secondary | ICD-10-CM

## 2017-11-23 DIAGNOSIS — R1013 Epigastric pain: Secondary | ICD-10-CM | POA: Diagnosis not present

## 2017-11-23 DIAGNOSIS — R12 Heartburn: Secondary | ICD-10-CM

## 2017-11-23 NOTE — Patient Instructions (Signed)
If you are age 52 or older, your body mass index should be between 23-30. Your Body mass index is 29.95 kg/m. If this is out of the aforementioned range listed, please consider follow up with your Primary Care Provider.  If you are age 48 or younger, your body mass index should be between 19-25. Your Body mass index is 29.95 kg/m. If this is out of the aformentioned range listed, please consider follow up with your Primary Care Provider.   You have been scheduled for an abdominal ultrasound at [facility] (1st floor of hospital) on 11-28-2017 at 8am. Please arrive 15 minutes prior to your appointment for registration. Make certain not to have anything to eat or drink 6 hours prior to your appointment. Should you need to reschedule your appointment, please contact radiology at [phone]. This test typically takes about 30 minutes to perform.  It was a pleasure to see you today!  Dr. Loletha Carrow

## 2017-11-23 NOTE — Progress Notes (Signed)
[facility] Consult Note:  History: Kendra Austin 11/23/2017  Referring physician: [name], DO  Reason for consult/chief complaint: Nausea (x 1 mth); Chest Pain; Heartburn (on/off for a while (worse over the last month after eating)); Diarrhea (culturelle helped); and Constipation (on culturelle-helped)   Subjective  HPI:  Kendra Austin was referred back to me by primary care for recent exacerbation of upper digestive symptoms.  She reports years of intermittent heartburn nausea that she felt was manageable with diet.  Symptoms have been worse for at least the last month, with intermittent heartburn, more so after eating heavy or spicy food was upper abdominal pain.  She recently had a severe episode of epigastric/lower chest nonexertional pain that felt worse with a deep breath finally subsided over couple of days.  She had been offered emergency department evaluation by primary care that day but declined.  She has had similar pain to a lesser degree since then always nonexertional, and she feels as if her insides are just sore.  Seen by primary care for laryngitis early July, felt likely to be viral, no antibiotics given. Primary care note 10/02/2017 reporting occasional right upper quadrant pain and reflux symptoms.  She had expressed some concerns about PPI therapy. Seen September 12 for nausea and diarrhea for several days, then some constipation. Seen October 3 for similar symptoms. Seen October 5 epigastric and throat pain.  Treated with PPI and Carafate. - neither has helped much , other than not had another episode of pain as severe.  She had a normal screening colonoscopy with me in May 2018.  Previously mild heartburn would respond to short course PPI.  Denies dysphagia, odynophagia.  She has had exacerbation of nausea recently that she treats with diet.  Nyanna says she generally does not like to take medications, and has noted little improvement lately on  PPI and Carafate.  She politely declined an offer of nausea medicine today.   ROS:   Review of Systems  Intermittent constipation or diarrhea Remainder of systems negative except as above  Past Medical History: Past Medical History:  Diagnosis Date  . Arthritis    osteoarthritis; neck, R shoulder  . Atypical chest pain    has had prior cardiac eval in the past/ started 4 years ago/ corrected by back adjustmment  . Complication of anesthesia    Per pt, sensitive to sedation!  . Depression    related to stress from prior job; no medications, hospitalizations or SI  . DUB (dysfunctional uterine bleeding) 07/22/2015  . Essential tremor 07/22/2015   left hand  . Hemorrhoids   . Hyperglycemia    Pre-diabetic per pt!  . Seasonal allergies 07/22/2015  . Vertigo    resolves with chiropratic; and sees Dr. Tamala Julian     Past Surgical History: Past Surgical History:  Procedure Laterality Date  . GANGLION CYST EXCISION     right wrist  . TONSILLECTOMY AND ADENOIDECTOMY  1971/1972  . UPPER GASTROINTESTINAL ENDOSCOPY       Family History: Family History  Problem Relation Age of Onset  . Diabetes Mother   . Macular degeneration Mother   . Heart disease Father   . Diabetes Father   . Macular degeneration Maternal Grandmother     Social History: Social History   Socioeconomic History  . Marital status: Single    Spouse name: Not on file  . Number of children: Not on file  . Years of education: Not on file  . Highest  education level: Not on file  Occupational History  . Not on file  Social Needs  . Financial resource strain: Not on file  . Food insecurity:    Worry: Not on file    Inability: Not on file  . Transportation needs:    Medical: Not on file    Non-medical: Not on file  Tobacco Use  . Smoking status: Never Smoker  . Smokeless tobacco: Never Used  Substance and Sexual Activity  . Alcohol use: No    Alcohol/week: 0.0 standard drinks  . Drug use: No  .  Sexual activity: Not on file  Lifestyle  . Physical activity:    Days per week: Not on file    Minutes per session: Not on file  . Stress: Not on file  Relationships  . Social connections:    Talks on phone: Not on file    Gets together: Not on file    Attends religious service: Not on file    Active member of club or organization: Not on file    Attends meetings of clubs or organizations: Not on file    Relationship status: Not on file  Other Topics Concern  . Not on file  Social History Narrative   Work or School: Hydrologist; teaches spanish and pedagogy       Home Situation:       Spiritual Beliefs:       Lifestyle: no regular exercise; diet is pretty good - avoids gluten          Allergies: Allergies  Allergen Reactions  . Benadryl [Diphenhydramine Hcl (Sleep)]     Dizzy, extremely drowsy, can cause a lot effects per pt!  Tori Milks [Naproxen Sodium]     Heart palpitations - can take ibuprofen  . Flexeril [Cyclobenzaprine] Other (See Comments)    Makes her extremely tired  . Meloxicam     Made her depressed    Outpatient Meds: Current Outpatient Medications  Medication Sig Dispense Refill  . Cholecalciferol (VITAMIN D PO) Take by mouth.    . CRYSELLE-28 0.3-30 MG-MCG tablet Take 1 tablet by mouth daily.  12  . fluticasone (FLONASE) 50 MCG/ACT nasal spray Place 2 sprays into both nostrils daily.    . Lactobacillus Rhamnosus, GG, (CULTURELLE PO) Take by mouth daily.    . LUTEIN PO Take by mouth.    . Multiple Vitamins-Minerals (MULTIVITAMIN ADULT PO) Take by mouth.    . Omega-3 Fatty Acids (FISH OIL PO) Take by mouth.    Marland Kitchen omeprazole (PRILOSEC) 40 MG capsule Take 1 capsule (40 mg total) by mouth daily. 30 capsule 3  . TURMERIC PO Take 1 capsule by mouth daily.      No current facility-administered medications for this visit.       ___________________________________________________________________ Objective   Exam:  BP 126/72   Ht 5\' 5"  (1.651 m)   Wt 180 lb  (81.6 kg)   BMI 29.95 kg/m    General: this is a(n) well-appearing woman with normal vocal quality  Eyes: sclera anicteric, no redness  ENT: oral mucosa moist without lesions, no cervical or supraclavicular lymphadenopathy, good dentition  CV: RRR without murmur, S1/S2, no JVD, no peripheral edema  Resp: clear to auscultation bilaterally, normal RR and effort noted  GI: soft, mild epigastric tenderness, with active bowel sounds. No guarding or palpable organomegaly noted.  Skin; warm and dry, no rash or jaundice noted  Neuro: awake, alert and oriented x 3. Normal gross motor function and fluent  speech  Labs:  CBC Latest Ref Rng & Units 07/22/2015  WBC 4.0 - 10.5 K/uL 10.4  Hemoglobin 12.0 - 15.0 g/dL 14.8  Hematocrit 36.0 - 46.0 % 44.4  Platelets 150.0 - 400.0 K/uL 255.0   CMP Latest Ref Rng & Units 05/31/2017 07/22/2015  Glucose 70 - 99 mg/dL 120(H) 83  BUN 6 - 23 mg/dL 11 14  Creatinine 0.40 - 1.20 mg/dL 0.78 0.77  Sodium 135 - 145 mEq/L 140 140  Potassium 3.5 - 5.1 mEq/L 4.1 4.2  Chloride 96 - 112 mEq/L 104 106  CO2 19 - 32 mEq/L 29 26  Calcium 8.4 - 10.5 mg/dL 9.3 10.1  Total Protein 6.0 - 8.3 g/dL - 6.7  Total Bilirubin 0.2 - 1.2 mg/dL - 0.4  Alkaline Phos 39 - 117 U/L - 50  AST 0 - 37 U/L - 17  ALT 0 - 35 U/L - 18     Radiologic Studies:  07/2015 [place] normal  Assessment: Encounter Diagnoses  Name Primary?  . Abdominal pain, epigastric Yes  . Nausea without vomiting   . Heartburn     It is unclear why her symptoms have worsened recently.  She had no new medicines or other clear triggers.  The more severe epigastric pain episode raises possibility of biliary colic, though she also has heartburn and nausea which are less typical symptoms.  I offered her abdominal ultrasound as well as upper endoscopy.  She would like to start with the ultrasound and then proceed to upper endoscopy if needed.  I offered her some Zofran, but she did not feel that was necessary at  this point.  She had stopped the Carafate, and has decided to continue the Prilosec because she feels it may be helping somewhat.  Thank you for the courtesy of this consult.  Please call me with any questions or concerns.  Nelida Meuse III  CC: [name], DO

## 2017-11-27 NOTE — Progress Notes (Signed)
[facility] [place],  [address] Phone: [phone] Subjective:    I'm seeing this patient by the request  of:    CC: Back pain follow-up  BSJ:[id number]  Kendra Austin is a 52 y.o. female coming in with complaint of back pain. She is here for OMT today.  Patient is responded very well to manipulation.  Has had some discomfort from time to time and does note that stress test seem to cause it.  Has been making some progress though with the conservative therapy.  Brace for wrist caused more pain. She said that she removed it and this helped alleviate her pain.  Overall would state that she is 95% better      Past Medical History:  Diagnosis Date  . Arthritis    osteoarthritis; neck, R shoulder  . Atypical chest pain    has had prior cardiac eval in the past/ started 4 years ago/ corrected by back adjustmment  . Complication of anesthesia    Per pt, sensitive to sedation!  . Depression    related to stress from prior job; no medications, hospitalizations or SI  . DUB (dysfunctional uterine bleeding) 07/22/2015  . Essential tremor 07/22/2015   left hand  . Hemorrhoids   . Hyperglycemia    Pre-diabetic per pt!  . Seasonal allergies 07/22/2015  . Vertigo    resolves with chiropratic; and sees Dr. Tamala Julian   Past Surgical History:  Procedure Laterality Date  . GANGLION CYST EXCISION     right wrist  . TONSILLECTOMY AND ADENOIDECTOMY  1971/1972  . UPPER GASTROINTESTINAL ENDOSCOPY     Social History   Socioeconomic History  . Marital status: Single    Spouse name: Not on file  . Number of children: Not on file  . Years of education: Not on file  . Highest education level: Not on file  Occupational History  . Not on file  Social Needs  . Financial resource strain: Not on file  . Food insecurity:    Worry: Not on file    Inability: Not on file  . Transportation needs:    Medical: Not on file    Non-medical: Not on file    Tobacco Use  . Smoking status: Never Smoker  . Smokeless tobacco: Never Used  Substance and Sexual Activity  . Alcohol use: No    Alcohol/week: 0.0 standard drinks  . Drug use: No  . Sexual activity: Not on file  Lifestyle  . Physical activity:    Days per week: Not on file    Minutes per session: Not on file  . Stress: Not on file  Relationships  . Social connections:    Talks on phone: Not on file    Gets together: Not on file    Attends religious service: Not on file    Active member of club or organization: Not on file    Attends meetings of clubs or organizations: Not on file    Relationship status: Not on file  Other Topics Concern  . Not on file  Social History Narrative   Work or School: Hydrologist; teaches spanish and pedagogy       Home Situation:       Spiritual Beliefs:       Lifestyle: no regular exercise; diet is pretty good - avoids gluten         Allergies  Allergen Reactions  . Benadryl [Diphenhydramine Hcl (Sleep)]  Dizzy, extremely drowsy, can cause a lot effects per pt!  Kendra Austin [Naproxen Sodium]     Heart palpitations - can take ibuprofen  . Flexeril [Cyclobenzaprine] Other (See Comments)    Makes her extremely tired  . Meloxicam     Made her depressed   Family History  Problem Relation Age of Onset  . Diabetes Mother   . Macular degeneration Mother   . Heart disease Father   . Diabetes Father   . Macular degeneration Maternal Grandmother     Current Outpatient Medications (Endocrine & Metabolic):  .  CRYSELLE-28 0.3-30 MG-MCG tablet, Take 1 tablet by mouth daily.   Current Outpatient Medications (Respiratory):  .  fluticasone (FLONASE) 50 MCG/ACT nasal spray, Place 2 sprays into both nostrils daily.    Current Outpatient Medications (Other):  Marland Kitchen  Cholecalciferol (VITAMIN D PO), Take by mouth. .  Lactobacillus Rhamnosus, GG, (CULTURELLE PO), Take by mouth daily. .  LUTEIN PO, Take by mouth. .  Multiple Vitamins-Minerals  (MULTIVITAMIN ADULT PO), Take by mouth. .  Omega-3 Fatty Acids (FISH OIL PO), Take by mouth. Marland Kitchen  omeprazole (PRILOSEC) 40 MG capsule, Take 1 capsule (40 mg total) by mouth daily. .  TURMERIC PO, Take 1 capsule by mouth daily.     Past medical history, social, surgical and family history all reviewed in electronic medical record.  No pertanent information unless stated regarding to the chief complaint.   Review of Systems:  No  visual changes, nausea, vomiting, diarrhea, constipation, dizziness, abdominal pain, skin rash, fevers, chills, night sweats, weight loss, swollen lymph nodes, body aches, joint swelling,  chest pain, shortness of breath, mood changes.  Positive muscle aches and headaches  Objective  Blood pressure 102/72, pulse 67, height 5\' 5"  (1.651 m), weight 179 lb (81.2 kg), SpO2 98 %.     General: No apparent distress alert and oriented x3 mood and affect normal, dressed appropriately.  HEENT: Pupils equal, extraocular movements intact  Respiratory: Patient's speak in full sentences and does not appear short of breath  Cardiovascular: No lower extremity edema, non tender, no erythema  Skin: Warm dry intact with no signs of infection or rash on extremities or on axial skeleton.  Abdomen: Soft nontender  Neuro: Cranial nerves II through XII are intact, neurovascularly intact in all extremities with 2+ DTRs and 2+ pulses.  Lymph: No lymphadenopathy of posterior or anterior cervical chain or axillae bilaterally.  Gait normal with good balance and coordination.  MSK:  Non tender with full range of motion and good stability and symmetric strength and tone of shoulders, elbows, wrist, hip, knee and ankles bilaterally.  Neck: Inspection loss of lordosis. No palpable stepoffs. Negative Spurling's maneuver. Mild limitation lacking last 5 degrees of extension and sidebending bilaterally Grip strength and sensation normal in bilateral hands Strength good C4 to T1 distribution No  sensory change to C4 to T1 Negative Hoffman sign bilaterally Reflexes normal Tightness of the trapezius bilaterally    Osteopathic findings C2 flexed rotated and side bent left T6 extended rotated and side bent left inhaled rib L2 flexed rotated and side bent right Sacrum right on right  Impression and Recommendations:     This case required medical decision making of moderate complexity. The above documentation has been reviewed and is accurate and complete Lyndal Pulley, DO       Note: This dictation was prepared with Dragon dictation along with smaller phrase technology. Any transcriptional errors that result from this process are unintentional.

## 2017-11-28 ENCOUNTER — Ambulatory Visit (HOSPITAL_COMMUNITY): Payer: BC Managed Care – PPO

## 2017-11-29 ENCOUNTER — Encounter: Payer: Self-pay | Admitting: Family Medicine

## 2017-11-29 ENCOUNTER — Ambulatory Visit: Payer: BC Managed Care – PPO | Admitting: Family Medicine

## 2017-11-29 VITALS — BP 102/72 | HR 67 | Ht 65.0 in | Wt 179.0 lb

## 2017-11-29 DIAGNOSIS — M999 Biomechanical lesion, unspecified: Secondary | ICD-10-CM | POA: Diagnosis not present

## 2017-11-29 DIAGNOSIS — R293 Abnormal posture: Secondary | ICD-10-CM | POA: Diagnosis not present

## 2017-11-29 NOTE — Assessment & Plan Note (Signed)
Continues to be postural.  Patient has done fairly well though with the home exercises.  Patient has lost a significant amount of weight which I think is been beneficial as well.  Still has significant amount of stress with her home life as well as with her work.  We discussed posture and ergonomics again.  Has responded well to manipulation.  Follow-up again in 3 months

## 2017-11-29 NOTE — Assessment & Plan Note (Signed)
Decision today to treat with OMT was based on Physical Exam  After verbal consent patient was treated with HVLA, ME, FPR techniques in cervical, thoracic, rib, lumbar and sacral areas  Patient tolerated the procedure well with improvement in symptoms  Patient given exercises, stretches and lifestyle modifications  See medications in patient instructions if given  Patient will follow up in 12 weeks

## 2017-11-29 NOTE — Patient Instructions (Signed)
Good to see you  Ice is your friend Stay active Take the towel off the bike See me again in 12 weeks

## 2017-12-11 ENCOUNTER — Ambulatory Visit (HOSPITAL_COMMUNITY)
Admission: RE | Admit: 2017-12-11 | Discharge: 2017-12-11 | Disposition: A | Discharge status: Home / Self Care | Payer: BC Managed Care – PPO | Source: Ambulatory Visit | Attending: Gastroenterology | Admitting: Gastroenterology

## 2017-12-11 DIAGNOSIS — R12 Heartburn: Secondary | ICD-10-CM | POA: Diagnosis not present

## 2017-12-11 DIAGNOSIS — R1013 Epigastric pain: Secondary | ICD-10-CM | POA: Diagnosis present

## 2017-12-11 DIAGNOSIS — R110 Nausea: Secondary | ICD-10-CM

## 2017-12-11 DIAGNOSIS — N2889 Other specified disorders of kidney and ureter: Secondary | ICD-10-CM | POA: Insufficient documentation

## 2018-01-04 ENCOUNTER — Other Ambulatory Visit: Payer: Self-pay

## 2018-01-04 ENCOUNTER — Ambulatory Visit (AMBULATORY_SURGERY_CENTER): Payer: Self-pay | Admitting: *Deleted

## 2018-01-04 ENCOUNTER — Telehealth: Payer: Self-pay | Admitting: *Deleted

## 2018-01-04 VITALS — Ht 65.0 in | Wt 183.0 lb

## 2018-01-04 DIAGNOSIS — R1013 Epigastric pain: Secondary | ICD-10-CM

## 2018-01-04 NOTE — Telephone Encounter (Signed)
Hi Kendra Austin During previsit today this patient relayed that her aunt,who she says is not a good historian and changes her stories, may have had "hyperthermia" following a csection. The aunt was placed in a ice bath. Patient has had a hysteroscopy under anesthesia and a recent Colonoscopy. Please advise Santiago Glad

## 2018-01-04 NOTE — Progress Notes (Signed)
No egg or soy allergy known to patient  No issues with past sedation with any surgeries  or procedures, no intubation problems Patient stating she is very sensitive to anesthetics,"doesnt take much." PaTIENT STATING HER AUNT WHO "IS NOT A GOOD HISTORIAN",, STATED THAT SHE HAD HYPERTHERMIA AFTER A CSECTION. "ALL WE KNOW IS THAT SHE WAS PLACED IN A ICE BATH. PAQTIENT STATING SHE HAS HAD A COLONOSCOPY RECENTLY AND HYSTEROSCOPY UNDER ANESTHESIA. TELEPHONE NOTE TO JOHN NULTY CRNA No diet pills per patient No home 02 use per patient  No blood thinners per patient  Pt denies issues with constipation  No A fib or A flutter  EMMI video offered and declined

## 2018-01-07 NOTE — Telephone Encounter (Signed)
Kendra Austin,  In May 2018 this pt had a colonoscopy at [facility] without complication.  This pt is cleared for anesthetic care at [facility].  Thanks,  Osvaldo Angst

## 2018-01-14 ENCOUNTER — Encounter: Payer: Self-pay | Admitting: Gastroenterology

## 2018-01-22 ENCOUNTER — Other Ambulatory Visit: Payer: Self-pay | Admitting: Family Medicine

## 2018-01-22 DIAGNOSIS — R1013 Epigastric pain: Secondary | ICD-10-CM

## 2018-01-28 ENCOUNTER — Encounter: Payer: Self-pay | Admitting: Gastroenterology

## 2018-01-28 ENCOUNTER — Ambulatory Visit (AMBULATORY_SURGERY_CENTER): Payer: BC Managed Care – PPO | Admitting: Gastroenterology

## 2018-01-28 VITALS — BP 133/67 | HR 79 | Temp 97.3°F | Resp 15 | Ht 65.0 in | Wt 179.0 lb

## 2018-01-28 DIAGNOSIS — K219 Gastro-esophageal reflux disease without esophagitis: Secondary | ICD-10-CM

## 2018-01-28 DIAGNOSIS — R12 Heartburn: Secondary | ICD-10-CM

## 2018-01-28 DIAGNOSIS — K208 Other esophagitis: Secondary | ICD-10-CM | POA: Diagnosis not present

## 2018-01-28 DIAGNOSIS — R110 Nausea: Secondary | ICD-10-CM

## 2018-01-28 DIAGNOSIS — K3189 Other diseases of stomach and duodenum: Secondary | ICD-10-CM

## 2018-01-28 DIAGNOSIS — R1013 Epigastric pain: Secondary | ICD-10-CM

## 2018-01-28 MED ORDER — SODIUM CHLORIDE 0.9 % IV SOLN: 500.0000 mL | Freq: Once | INTRAVENOUS | Status: AC

## 2018-01-28 NOTE — Progress Notes (Signed)
Pt's states no medical or surgical changes since previsit or office visit. 

## 2018-01-28 NOTE — Patient Instructions (Signed)
Impression/Recommendations:  Barrett's esophagus handout given to patient.  Continue present medications. Resume previous diet.  YOU HAD AN ENDOSCOPIC PROCEDURE TODAY AT [facility]:   Refer to the procedure report that was given to you for any specific questions about what was found during the examination.  If the procedure report does not answer your questions, please call your gastroenterologist to clarify.  If you requested that your care partner not be given the details of your procedure findings, then the procedure report has been included in a sealed envelope for you to review at your convenience later.  YOU SHOULD EXPECT: Some feelings of bloating in the abdomen. Passage of more gas than usual.  Walking can help get rid of the air that was put into your GI tract during the procedure and reduce the bloating. If you had a lower endoscopy (such as a colonoscopy or flexible sigmoidoscopy) you may notice spotting of blood in your stool or on the toilet paper. If you underwent a bowel prep for your procedure, you may not have a normal bowel movement for a few days.  Please Note:  You might notice some irritation and congestion in your nose or some drainage.  This is from the oxygen used during your procedure.  There is no need for concern and it should clear up in a day or so.  SYMPTOMS TO REPORT IMMEDIATELY:  Following upper endoscopy (EGD)  Vomiting of blood or coffee ground material  New chest pain or pain under the shoulder blades  Painful or persistently difficult swallowing  New shortness of breath  Fever of 100F or higher  Black, tarry-looking stools  For urgent or emergent issues, a gastroenterologist can be reached at any hour by calling [phone].   DIET:  We do recommend a small meal at first, but then you may proceed to your regular diet.  Drink plenty of fluids but you should avoid alcoholic beverages for 24 hours.  ACTIVITY:  You should plan to take  it easy for the rest of today and you should NOT DRIVE or use heavy machinery until tomorrow (because of the sedation medicines used during the test).    FOLLOW UP: Our staff will call the number listed on your records the next business day following your procedure to check on you and address any questions or concerns that you may have regarding the information given to you following your procedure. If we do not reach you, we will leave a message.  However, if you are feeling well and you are not experiencing any problems, there is no need to return our call.  We will assume that you have returned to your regular daily activities without incident.  If any biopsies were taken you will be contacted by phone or by letter within the next 1-3 weeks.  Please call us at [phone] if you have not heard about the biopsies in 3 weeks.    SIGNATURES/CONFIDENTIALITY: You and/or your care partner have signed paperwork which will be entered into your electronic medical record.  These signatures attest to the fact that that the information above on your After Visit Summary has been reviewed and is understood.  Full responsibility of the confidentiality of this discharge information lies with you and/or your care-partner.

## 2018-01-28 NOTE — Op Note (Signed)
[facility] Patient Name: Maurice Ramseur Procedure Date: 01/28/2018 8:17 AM MRN: [id number] Endoscopist: Mallie Mussel L. Loletha Carrow , MD Age: 53 Referring MD:  Date of Birth: 11-20-1965 Gender: Female Account #: [id number] Procedure:                Upper GI endoscopy Indications:              Epigastric abdominal pain, Esophageal reflux                            symptoms that persist despite appropriate therapy,                            Nausea Medicines:                Monitored Anesthesia Care Procedure:                Pre-Anesthesia Assessment:                           - Prior to the procedure, a History and Physical                            was performed, and patient medications and                            allergies were reviewed. The patient's tolerance of                            previous anesthesia was also reviewed. The risks                            and benefits of the procedure and the sedation                            options and risks were discussed with the patient.                            All questions were answered, and informed consent                            was obtained. Prior Anticoagulants: The patient has                            taken no previous anticoagulant or antiplatelet                            agents. ASA Grade Assessment: II - A patient with                            mild systemic disease. After reviewing the risks                            and benefits, the patient was deemed in  satisfactory condition to undergo the procedure.                           After obtaining informed consent, the endoscope was                            passed under direct vision. Throughout the                            procedure, the patient's blood pressure, pulse, and                            oxygen saturations were monitored continuously. The                            Model GIF-[id number]) scope was introduced                        through the mouth, and advanced to the second part                            of duodenum. The upper GI endoscopy was                            accomplished without difficulty. The patient                            tolerated the procedure well. Scope In: Scope Out: Findings:                 Two tongues of salmon-colored mucosa were present                            at 38 cm. No other visible abnormalities were                            present. The maximum longitudinal extent of these                            esophageal mucosal changes was 1 cm in length.                            Biopsies were taken with a cold forceps for                            histology. No esophagitis was seen.                           The entire examined stomach was normal. Biopsies                            were taken with a cold forceps for histology.                            (Sidney protocol).  The cardia and gastric fundus were normal on                            retroflexion.                           The examined duodenum was normal. Complications:            No immediate complications. Estimated Blood Loss:     Estimated blood loss was minimal. Impression:               - Salmon-colored mucosa suggestive of short-segment                            Barrett's esophagus. Biopsied.                           - Normal stomach. Biopsied.                           - Normal examined duodenum. Recommendation:           - Patient has a contact number available for                            emergencies. The signs and symptoms of potential                            delayed complications were discussed with the                            patient. Return to normal activities tomorrow.                            Written discharge instructions were provided to the                            patient.                           - Resume previous diet.                            - Continue present medications.                           - Await pathology results. Henry L. Loletha Carrow, MD 01/28/2018 8:33:01 AM This report has been signed electronically.

## 2018-01-28 NOTE — Progress Notes (Signed)
Called to room to assist during endoscopic procedure.  Patient ID and intended procedure confirmed with present staff. Received instructions for my participation in the procedure from the performing physician.  

## 2018-01-29 ENCOUNTER — Telehealth: Payer: Self-pay

## 2018-01-29 NOTE — Telephone Encounter (Signed)
  Follow up Call-  Call back number 01/28/2018 06/14/2016  Post procedure Call Back phone  # [phone]  Permission to leave phone message Yes Yes  Some recent data might be hidden     Patient questions:  Do you have a fever, pain , or abdominal swelling? No. Pain Score  0 *  Have you tolerated food without any problems? Yes.    Have you been able to return to your normal activities? Yes.    Do you have any questions about your discharge instructions: Diet   No. Medications  No. Follow up visit  No.  Do you have questions or concerns about your Care? No.  Actions: * If pain score is 4 or above: No action needed, pain <4.

## 2018-01-30 NOTE — Progress Notes (Signed)
HPI:  Using dictation device. Unfortunately this device frequently misinterprets words/phrases.  Kendra Austin is a pleasant 53 y.o. here for follow up. Chronic medical problems summarized below were reviewed for changes and stability and were updated as needed below. These issues and their treatment remain stable for the most part. Reports doing ok. Saw GI and waiting on path from EGD. On PPI and has tolerated ok without anxiety - does not feel like helps the acid reflux much - maybe a little. Feels needs to stick to a diet for less symptoms and holidays were tough. Had a lot of sugar. Trying to start a more regular exercise program. Denies CP, SOB, DOE, treatment intolerance or new symptoms. Due for labs  Diabetes mellitis,Hx obesityand HLD:  -lifestyle recommendations advised -BMI under 30 last visit and labs improving   GERD/NAUSEA/IBS: -declined PPIs in the past as reported made her anxious; now taking priolosec -reports eval with GI and EGD about in 2016 ok;  -advised further eval earlier this year, but she declined -symptoms worsened and saw GI again 11/2017 for nausea and had EGD 01/28/2018 - waiting on biopsy results  Essential Tremor: - chronic, everyone in her family has this -action tremor, not interfering with ativities  DUB: -saw gyn in the past, on OCPs -now seeing gyn here and reportsdid her gyn exam  ROS: See pertinent positives and negatives per HPI.  Past Medical History:  Diagnosis Date  . Allergy   . Arthritis    osteoarthritis; neck, R shoulder  . Atypical chest pain    has had prior cardiac eval in the past/ started 4 years ago/ corrected by back adjustmment  . Complication of anesthesia    Per pt, sensitive to sedation!  . Depression    related to stress from prior job; no medications, hospitalizations or SI  . DUB (dysfunctional uterine bleeding) 07/22/2015  . Essential tremor 07/22/2015   left hand  . GERD (gastroesophageal reflux disease)   .  Hemorrhoids   . Hyperglycemia    Pre-diabetic per pt!  . Hyperlipidemia   . Seasonal allergies 07/22/2015  . Vertigo    resolves with chiropratic; and sees Dr. Tamala Julian    Past Surgical History:  Procedure Laterality Date  . COLONOSCOPY    . GANGLION CYST EXCISION     right wrist  . HYSTEROSCOPY    . TONSILLECTOMY AND ADENOIDECTOMY  1971/1972  . UPPER GASTROINTESTINAL ENDOSCOPY      Family History  Problem Relation Age of Onset  . Diabetes Mother   . Macular degeneration Mother   . Colon polyps Mother   . Heart disease Father   . Diabetes Father   . Macular degeneration Maternal Grandmother   . Colon cancer Neg Hx   . Esophageal cancer Neg Hx   . Stomach cancer Neg Hx   . Rectal cancer Neg Hx     SOCIAL HX: see hpi   Current Outpatient Medications:  .  Cholecalciferol (VITAMIN D PO), Take by mouth., Disp: , Rfl:  .  CRYSELLE-28 0.3-30 MG-MCG tablet, Take 1 tablet by mouth daily., Disp: , Rfl: 12 .  fluticasone (FLONASE) 50 MCG/ACT nasal spray, Place 2 sprays into both nostrils daily., Disp: , Rfl:  .  Lactobacillus Rhamnosus, GG, (CULTURELLE PO), Take by mouth daily., Disp: , Rfl:  .  LUTEIN PO, Take by mouth., Disp: , Rfl:  .  Multiple Vitamins-Minerals (MULTIVITAMIN ADULT PO), Take by mouth., Disp: , Rfl:  .  Omega-3 Fatty Acids (FISH OIL  PO), Take by mouth., Disp: , Rfl:  .  omeprazole (PRILOSEC) 40 MG capsule, TAKE 1 CAPSULE BY MOUTH EVERY DAY, Disp: 90 capsule, Rfl: 1 .  TURMERIC PO, Take 1 capsule by mouth daily. , Disp: , Rfl:   Current Facility-Administered Medications:  .  0.9 %  sodium chloride infusion, 500 mL, Intravenous, Once, Danis, Estill Cotta III, MD  EXAM:  Vitals:   01/31/18 0826  BP: 100/62  Pulse: 70  Temp: 98.3 F (36.8 C)    Body mass index is 29.95 kg/m.  GENERAL: vitals reviewed and listed above, alert, oriented, appears well hydrated and in no acute distress  HEENT: atraumatic, conjunttiva clear, no obvious abnormalities on inspection  of external nose and ears  NECK: no obvious masses on inspection  LUNGS: clear to auscultation bilaterally, no wheezes, rales or rhonchi, good air movement  CV: HRRR, no peripheral edema  MS: moves all extremities without noticeable abnormality  PSYCH: pleasant and cooperative, no obvious depression or anxiety  ASSESSMENT AND PLAN:  Discussed the following assessment and plan:  Type 2 diabetes mellitus with hyperglycemia, without long-term current use of insulin ([place]) - Plan: Basic metabolic panel, Hemoglobin A1c  Hyperlipidemia associated with type 2 diabetes mellitus ([place]) - Plan: Lipid panel  Abdominal pain, unspecified abdominal location  Nausea  -lifestyle recs -labs today -advised to follow up with GI about any ongoing or recurring or new GI symptoms -follow up 3 months, sooner as needed -Patient advised to return or notify a doctor immediately if symptoms worsen or persist or new concerns arise.  Patient Instructions  BEFORE YOU LEAVE: -labs -follow up: CPE in 3-4 months  Continue the acid medication and discuss any symptoms that persist with your gastroenterologist.  We have ordered labs or studies at this visit. It can take up to 1-2 weeks for results and processing. IF results require follow up or explanation, we will call you with instructions. Clinically stable results will be released to your [facility]. If you have not heard from [place] or cannot find your results in [facility] in 2 weeks please contact our office at [phone].  If you are not yet signed up for [facility], please consider signing up.   We recommend the following healthy lifestyle for LIFE: 1) Small portions. But, make sure to get regular (at least 3 per day), healthy meals and small healthy snacks if needed.  2) Eat a healthy clean diet.   TRY TO EAT: -at least 5-7 servings of low sugar, colorful, and nutrient rich vegetables per day (not corn, potatoes or bananas.) -berries are the best choice if  you wish to eat fruit (only eat small amounts if trying to reduce weight)  -lean meets (fish, white meat of chicken or [place]) -vegan proteins for some meals - beans or tofu, whole grains, nuts and seeds -Replace bad fats with good fats - good fats include: fish, nuts and seeds, canola oil, olive oil -small amounts of low fat or non fat dairy -small amounts of100 % whole grains - check the lables -drink plenty of water  AVOID: -SUGAR, sweets, anything with added sugar, corn syrup or sweeteners - must read labels as even foods advertised as "healthy" often are loaded with sugar -if you must have a sweetener, small amounts of stevia may be best -sweetened beverages and artificially sweetened beverages -simple starches (rice, bread, potatoes, pasta, chips, etc - small amounts of 100% whole grains are ok) -red meat, pork, butter -fried foods, fast food, processed food, excessive  dairy, eggs and coconut.  3)Get at least 150 minutes of sweaty aerobic exercise per week.  4)Reduce stress - consider counseling, meditation and relaxation to balance other aspects of your life.          [name], DO

## 2018-01-31 ENCOUNTER — Encounter: Payer: Self-pay | Admitting: Family Medicine

## 2018-01-31 ENCOUNTER — Ambulatory Visit: Payer: BC Managed Care – PPO | Admitting: Family Medicine

## 2018-01-31 VITALS — BP 100/62 | HR 70 | Temp 98.3°F | Ht 65.0 in | Wt 180.0 lb

## 2018-01-31 DIAGNOSIS — R109 Unspecified abdominal pain: Secondary | ICD-10-CM | POA: Diagnosis not present

## 2018-01-31 DIAGNOSIS — E785 Hyperlipidemia, unspecified: Secondary | ICD-10-CM

## 2018-01-31 DIAGNOSIS — E1165 Type 2 diabetes mellitus with hyperglycemia: Secondary | ICD-10-CM

## 2018-01-31 DIAGNOSIS — R110 Nausea: Secondary | ICD-10-CM | POA: Diagnosis not present

## 2018-01-31 DIAGNOSIS — E1169 Type 2 diabetes mellitus with other specified complication: Secondary | ICD-10-CM | POA: Diagnosis not present

## 2018-01-31 LAB — LIPID PANEL
Cholesterol: 189 mg/dL (ref 0–200)
HDL: 46.4 mg/dL (ref >39.00)
LDL Cholesterol: 121 mg/dL — ABNORMAL HIGH (ref 0–99)
NonHDL: 142.94
Total CHOL/HDL Ratio: 4
Triglycerides: 111 mg/dL (ref 0.0–149.0)
VLDL: 22.2 mg/dL (ref 0.0–40.0)

## 2018-01-31 LAB — BASIC METABOLIC PANEL
BUN: 13 mg/dL (ref 6–23)
CO2: 27 mEq/L (ref 19–32)
CREATININE: 0.82 mg/dL (ref 0.40–1.20)
Calcium: 9.6 mg/dL (ref 8.4–10.5)
Chloride: 103 mEq/L (ref 96–112)
GFR: 77.6 mL/min (ref >60.00)
Glucose, Bld: 121 mg/dL — ABNORMAL HIGH (ref 70–99)
Potassium: 4.4 mEq/L (ref 3.5–5.1)
Sodium: 138 mEq/L (ref 135–145)

## 2018-01-31 LAB — HEMOGLOBIN A1C: Hgb A1c MFr Bld: 7.1 % — ABNORMAL HIGH (ref 4.6–6.5)

## 2018-01-31 NOTE — Patient Instructions (Signed)
BEFORE YOU LEAVE: -labs -follow up: CPE in 3-4 months  Continue the acid medication and discuss any symptoms that persist with your gastroenterologist.  We have ordered labs or studies at this visit. It can take up to 1-2 weeks for results and processing. IF results require follow up or explanation, we will call you with instructions. Clinically stable results will be released to your [facility]. If you have not heard from [place] or cannot find your results in [facility] in 2 weeks please contact our office at [phone].  If you are not yet signed up for [facility], please consider signing up.   We recommend the following healthy lifestyle for LIFE: 1) Small portions. But, make sure to get regular (at least 3 per day), healthy meals and small healthy snacks if needed.  2) Eat a healthy clean diet.   TRY TO EAT: -at least 5-7 servings of low sugar, colorful, and nutrient rich vegetables per day (not corn, potatoes or bananas.) -berries are the best choice if you wish to eat fruit (only eat small amounts if trying to reduce weight)  -lean meets (fish, white meat of chicken or [place]) -vegan proteins for some meals - beans or tofu, whole grains, nuts and seeds -Replace bad fats with good fats - good fats include: fish, nuts and seeds, canola oil, olive oil -small amounts of low fat or non fat dairy -small amounts of100 % whole grains - check the lables -drink plenty of water  AVOID: -SUGAR, sweets, anything with added sugar, corn syrup or sweeteners - must read labels as even foods advertised as "healthy" often are loaded with sugar -if you must have a sweetener, small amounts of stevia may be best -sweetened beverages and artificially sweetened beverages -simple starches (rice, bread, potatoes, pasta, chips, etc - small amounts of 100% whole grains are ok) -red meat, pork, butter -fried foods, fast food, processed food, excessive dairy, eggs and coconut.  3)Get at least 150 minutes of sweaty  aerobic exercise per week.  4)Reduce stress - consider counseling, meditation and relaxation to balance other aspects of your life.

## 2018-02-01 LAB — HM MAMMOGRAPHY

## 2018-02-01 LAB — HM PAP SMEAR

## 2018-02-04 ENCOUNTER — Encounter: Payer: Self-pay | Admitting: Gastroenterology

## 2018-02-05 NOTE — Progress Notes (Signed)
HPI:  Using dictation device. Unfortunately this device frequently misinterprets words/phrases.  Kendra Austin is a pleasant 53 year old here for follow-up regarding her recent lab results.  Unfortunately, they showed her diabetes lab higher and cholesterol too high.  Her hemoglobin A1c was 7.1.  Her LDL was 121. Reports already making changes in diet - cut our deserts and cookies and lost 2 lbs. Also has started exercising. Seeing GI for reflux. Wants to start medications for cholesterol and diabetes as she admits has trouble sticking to healthy lifestyles. Wants me to check R ear. occ popping for 1-2 weeks. Did swim a few weeks ago. No pain, fever, drainage.  ROS: See pertinent positives and negatives per HPI.  Past Medical History:  Diagnosis Date  . Allergy   . Arthritis    osteoarthritis; neck, R shoulder  . Atypical chest pain    has had prior cardiac eval in the past/ started 4 years ago/ corrected by back adjustmment  . Complication of anesthesia    Per pt, sensitive to sedation!  . Depression    related to stress from prior job; no medications, hospitalizations or SI  . DUB (dysfunctional uterine bleeding) 07/22/2015  . Essential tremor 07/22/2015   left hand  . GERD (gastroesophageal reflux disease)   . Hemorrhoids   . Hyperglycemia    Pre-diabetic per pt!  . Hyperlipidemia   . Seasonal allergies 07/22/2015  . Vertigo    resolves with chiropratic; and sees Dr. Tamala Julian    Past Surgical History:  Procedure Laterality Date  . COLONOSCOPY    . GANGLION CYST EXCISION     right wrist  . HYSTEROSCOPY    . TONSILLECTOMY AND ADENOIDECTOMY  1971/1972  . UPPER GASTROINTESTINAL ENDOSCOPY      Family History  Problem Relation Age of Onset  . Diabetes Mother   . Macular degeneration Mother   . Colon polyps Mother   . Heart disease Father   . Diabetes Father   . Macular degeneration Maternal Grandmother   . Colon cancer Neg Hx   . Esophageal cancer Neg Hx   . Stomach  cancer Neg Hx   . Rectal cancer Neg Hx     SOCIAL HX: see hpi   Current Outpatient Medications:  .  Cholecalciferol (VITAMIN D PO), Take by mouth., Disp: , Rfl:  .  CRYSELLE-28 0.3-30 MG-MCG tablet, Take 1 tablet by mouth daily., Disp: , Rfl: 12 .  fluticasone (FLONASE) 50 MCG/ACT nasal spray, Place 2 sprays into both nostrils daily., Disp: , Rfl:  .  Lactobacillus Rhamnosus, GG, (CULTURELLE PO), Take by mouth daily., Disp: , Rfl:  .  LUTEIN PO, Take by mouth., Disp: , Rfl:  .  Multiple Vitamins-Minerals (MULTIVITAMIN ADULT PO), Take by mouth., Disp: , Rfl:  .  Omega-3 Fatty Acids (FISH OIL PO), Take by mouth., Disp: , Rfl:  .  omeprazole (PRILOSEC) 40 MG capsule, TAKE 1 CAPSULE BY MOUTH EVERY DAY, Disp: 90 capsule, Rfl: 1 .  TURMERIC PO, Take 1 capsule by mouth daily. , Disp: , Rfl:   Current Facility-Administered Medications:  .  0.9 %  sodium chloride infusion, 500 mL, Intravenous, Once, Danis, Estill Cotta III, MD  EXAM:  Vitals:   02/07/18 0808  BP: 110/80  Pulse: 79  Temp: 98.8 F (37.1 C)    Body mass index is 30 kg/m.  GENERAL: vitals reviewed and listed above, alert, oriented, appears well hydrated and in no acute distress  HEENT: atraumatic, conjunttiva clear, no obvious abnormalities  on inspection of external nose and ears, normal appearance R ear canal and TM  NECK: no obvious masses on inspection  LUNGS: clear to auscultation bilaterally, no wheezes, rales or rhonchi, good air movement  CV: HRRR, no peripheral edema  MS: moves all extremities without noticeable abnormality  PSYCH: pleasant and cooperative, no obvious depression or anxiety  ASSESSMENT AND PLAN:  Discussed the following assessment and plan:  Diabetes mellitus without complication ([facility])  Hyperlipidemia associated with type 2 diabetes mellitus ([facility])  BMI 30.0-30.9,adult  Dysfunction of right eustachian tube  -discussed implications, treatment options, treatment risks, risks, potential  complications lab results. -she agrees to try to adhere to healthy low sugar diet and regular exercise -she wants to start low dose crestor and low dose metformin -likley mild ETD - she is to follow up if persists or worsens -seeing GI for GERD -follow up 3-4 months -Patient advised to return or notify a doctor immediately if symptoms worsen or persist or new concerns arise.  There are no Patient Instructions on file for this visit.  Kendra Kern, DO

## 2018-02-07 ENCOUNTER — Encounter: Payer: Self-pay | Admitting: Family Medicine

## 2018-02-07 ENCOUNTER — Ambulatory Visit: Payer: BC Managed Care – PPO | Admitting: Family Medicine

## 2018-02-07 VITALS — BP 110/80 | HR 79 | Temp 98.8°F | Ht 65.0 in | Wt 180.3 lb

## 2018-02-07 DIAGNOSIS — H6981 Other specified disorders of Eustachian tube, right ear: Secondary | ICD-10-CM

## 2018-02-07 DIAGNOSIS — Z6830 Body mass index (BMI) 30.0-30.9, adult: Secondary | ICD-10-CM

## 2018-02-07 DIAGNOSIS — E1169 Type 2 diabetes mellitus with other specified complication: Secondary | ICD-10-CM | POA: Diagnosis not present

## 2018-02-07 DIAGNOSIS — E785 Hyperlipidemia, unspecified: Secondary | ICD-10-CM

## 2018-02-07 DIAGNOSIS — E119 Type 2 diabetes mellitus without complications: Secondary | ICD-10-CM

## 2018-02-07 MED ORDER — ROSUVASTATIN CALCIUM 5 MG PO TABS: 5.0000 mg | ORAL_TABLET | Freq: Every day | ORAL | 3 refills | Status: DC

## 2018-02-07 MED ORDER — METFORMIN HCL 500 MG PO TABS: 500.0000 mg | ORAL_TABLET | Freq: Every day | ORAL | 3 refills | Status: DC

## 2018-02-07 NOTE — Patient Instructions (Signed)
BEFORE YOU LEAVE: -follow up: 3-4 months, come fasting   START crestor and Metformin.  Healthy low sugar diet and regular exercise.  Get DIABETIC EYE EXAM.

## 2018-02-25 NOTE — Progress Notes (Signed)
[name] [place] [place], [place] [address] Phone: [phone] Subjective:    [name] am serving as a Education administrator for Dr. Hulan Saas.   CC: Back pain neck pain follow-up  [id number]     02/26/2018  Kendra Austin is a 53 y.o. female coming in with complaint of back pain.  Have seen patient numerous times for this.  Has responded fairly well to conservative therapy.  Was doing better and losing weight for some time but has gained weight and also diagnosed with diabetes.  Started on metformin.  Has not noticed any significant difference at this time.  Has not been able to workout on a regular basis.  More stress at work that is likely contributing to some of her aches and pain she thinks as well.       Past Medical History:  Diagnosis Date  . Allergy   . Arthritis    osteoarthritis; neck, R shoulder  . Atypical chest pain    has had prior cardiac eval in the past/ started 4 years ago/ corrected by back adjustmment  . Complication of anesthesia    Per pt, sensitive to sedation!  . Depression    related to stress from prior job; no medications, hospitalizations or SI  . DUB (dysfunctional uterine bleeding) 07/22/2015  . Essential tremor 07/22/2015   left hand  . GERD (gastroesophageal reflux disease)   . Hemorrhoids   . Hyperglycemia    Pre-diabetic per pt!  . Hyperlipidemia   . Seasonal allergies 07/22/2015  . Vertigo    resolves with chiropratic; and sees Dr. Tamala Julian   Past Surgical History:  Procedure Laterality Date  . COLONOSCOPY    . GANGLION CYST EXCISION     right wrist  . HYSTEROSCOPY    . TONSILLECTOMY AND ADENOIDECTOMY  1971/1972  . UPPER GASTROINTESTINAL ENDOSCOPY     Social History   Socioeconomic History  . Marital status: Single    Spouse name: Not on file  . Number of children: Not on file  . Years of education: Not on file  . Highest education level: Not on file  Occupational History  . Not on file    Social Needs  . Financial resource strain: Not on file  . Food insecurity:    Worry: Not on file    Inability: Not on file  . Transportation needs:    Medical: Not on file    Non-medical: Not on file  Tobacco Use  . Smoking status: Never Smoker  . Smokeless tobacco: Never Used  Substance and Sexual Activity  . Alcohol use: No    Alcohol/week: 0.0 standard drinks  . Drug use: No  . Sexual activity: Not on file  Lifestyle  . Physical activity:    Days per week: Not on file    Minutes per session: Not on file  . Stress: Not on file  Relationships  . Social connections:    Talks on phone: Not on file    Gets together: Not on file    Attends religious service: Not on file    Active member of club or organization: Not on file    Attends meetings of clubs or organizations: Not on file    Relationship status: Not on file  Other Topics Concern  . Not on file  Social History Narrative   Work or School: Hydrologist; Soil scientist and pedagogy       Home Situation:  Spiritual Beliefs:       Lifestyle: no regular exercise; diet is pretty good - avoids gluten         Allergies  Allergen Reactions  . Benadryl [Diphenhydramine Hcl (Sleep)]     Dizzy, extremely drowsy, can cause a lot effects per pt!  Tori Milks [Naproxen Sodium]     Heart palpitations - can take ibuprofen  . Flexeril [Cyclobenzaprine] Other (See Comments)    Makes her extremely tired  . Meloxicam     Made her depressed   Family History  Problem Relation Age of Onset  . Diabetes Mother   . Macular degeneration Mother   . Colon polyps Mother   . Heart disease Father   . Diabetes Father   . Macular degeneration Maternal Grandmother   . Colon cancer Neg Hx   . Esophageal cancer Neg Hx   . Stomach cancer Neg Hx   . Rectal cancer Neg Hx     Current Outpatient Medications (Endocrine & Metabolic):  .  CRYSELLE-28 0.3-30 MG-MCG tablet, Take 1 tablet by mouth daily. .  metFORMIN (GLUCOPHAGE) 500 MG tablet,  Take 1 tablet (500 mg total) by mouth daily with breakfast.   Current Outpatient Medications (Cardiovascular):  .  rosuvastatin (CRESTOR) 5 MG tablet, Take 1 tablet (5 mg total) by mouth daily.   Current Outpatient Medications (Respiratory):  .  fluticasone (FLONASE) 50 MCG/ACT nasal spray, Place 2 sprays into both nostrils daily.       Current Outpatient Medications (Other):  Marland Kitchen  Cholecalciferol (VITAMIN D PO), Take by mouth. .  Lactobacillus Rhamnosus, GG, (CULTURELLE PO), Take by mouth daily. .  LUTEIN PO, Take by mouth. .  Multiple Vitamins-Minerals (MULTIVITAMIN ADULT PO), Take by mouth. .  Omega-3 Fatty Acids (FISH OIL PO), Take by mouth. Marland Kitchen  omeprazole (PRILOSEC) 40 MG capsule, TAKE 1 CAPSULE BY MOUTH EVERY DAY .  TURMERIC PO, Take 1 capsule by mouth daily.   Current Facility-Administered Medications (Other):  .  0.9 %  sodium chloride infusion    Past medical history, social, surgical and family history all reviewed in electronic medical record.  No pertanent information unless stated regarding to the chief complaint.   Review of Systems:  No headache, visual changes, nausea, vomiting, diarrhea, constipation, dizziness, abdominal pain, skin rash, fevers, chills, night sweats, weight loss, swollen lymph nodes, body aches, joint swelling,  chest pain, shortness of breath, mood changes.  Positive muscle aches  Objective  Blood pressure 130/78, pulse 78, height 5\' 5"  (1.651 m), weight 180 lb (81.6 kg), SpO2 96 %.    General: No apparent distress alert and oriented x3 mood and affect normal, dressed appropriately.  HEENT: Pupils equal, extraocular movements intact  Respiratory: Patient's speak in full sentences and does not appear short of breath  Cardiovascular: No lower extremity edema, non tender, no erythema  Skin: Warm dry intact with no signs of infection or rash on extremities or on axial skeleton.  Abdomen: Soft nontender  Neuro: Cranial nerves II through XII are  intact, neurovascularly intact in all extremities with 2+ DTRs and 2+ pulses.  Lymph: No lymphadenopathy of posterior or anterior cervical chain or axillae bilaterally.  Gait normal with good balance and coordination.  MSK:  Non tender with full range of motion and good stability and symmetric strength and tone of shoulders, elbows, wrist, hip, knee and ankles bilaterally.  Back Exam:  Inspection: Mild loss of lordosis Motion: Flexion 45 deg, Extension 25 deg, Side Bending to 25 deg  bilaterally, Rotation to 25 deg bilaterally  SLR laying: Negative  XSLR laying: Negative  Palpable tenderness: Tender to palpation the paraspinal musculature.Marland Kitchen FABER: Tightness bilaterally. Sensory change: Gross sensation intact to all lumbar and sacral dermatomes.  Reflexes: 2+ at both patellar tendons, 2+ at achilles tendons, Babinski's downgoing.  Strength at foot  Plantar-flexion: 5/5 Dorsi-flexion: 5/5 Eversion: 5/5 Inversion: 5/5  Leg strength  Quad: 5/5 Hamstring: 5/5 Hip flexor: 5/5 Hip abductors: 4/5 symmetric   Osteopathic findings C3 flexed rotated and side bent right T9 extended rotated and side bent left L3 flexed rotated and side bent right Sacrum right on right     Impression and Recommendations:     This case required medical decision making of moderate complexity. The above documentation has been reviewed and is accurate and complete Lyndal Pulley, DO       Note: This dictation was prepared with Dragon dictation along with smaller phrase technology. Any transcriptional errors that result from this process are unintentional.

## 2018-02-26 ENCOUNTER — Ambulatory Visit: Payer: BC Managed Care – PPO | Admitting: Family Medicine

## 2018-02-26 ENCOUNTER — Encounter: Payer: Self-pay | Admitting: Family Medicine

## 2018-02-26 VITALS — BP 130/78 | HR 78 | Ht 65.0 in | Wt 180.0 lb

## 2018-02-26 DIAGNOSIS — R293 Abnormal posture: Secondary | ICD-10-CM

## 2018-02-26 DIAGNOSIS — M999 Biomechanical lesion, unspecified: Secondary | ICD-10-CM

## 2018-02-26 NOTE — Assessment & Plan Note (Signed)
Discussed posture and ergonomics.  Has responded well to manipulation.  No significant changes but would like to see patient with increasing frequency secondary to increasing tightness

## 2018-02-26 NOTE — Assessment & Plan Note (Signed)
Decision today to treat with OMT was based on Physical Exam  After verbal consent patient was treated with HVLA, ME, FPR techniques in cervical, thoracic, lumbar and sacral areas  Patient tolerated the procedure well with improvement in symptoms  Patient given exercises, stretches and lifestyle modifications  See medications in patient instructions if given  Patient will follow up in 4-8 weeks 

## 2018-02-26 NOTE — Patient Instructions (Signed)
Good to see you  You are doing well a little more tight Remember the tennis balls  See me again in 8 weeks

## 2018-03-12 ENCOUNTER — Ambulatory Visit: Payer: BC Managed Care – PPO | Admitting: Family Medicine

## 2018-03-12 ENCOUNTER — Encounter: Payer: Self-pay | Admitting: Family Medicine

## 2018-03-12 VITALS — BP 120/80 | HR 82 | Temp 98.4°F | Ht 65.0 in | Wt 179.9 lb

## 2018-03-12 DIAGNOSIS — M545 Low back pain, unspecified: Secondary | ICD-10-CM

## 2018-03-12 DIAGNOSIS — M9905 Segmental and somatic dysfunction of pelvic region: Secondary | ICD-10-CM

## 2018-03-12 DIAGNOSIS — M9903 Segmental and somatic dysfunction of lumbar region: Secondary | ICD-10-CM | POA: Diagnosis not present

## 2018-03-12 DIAGNOSIS — G8929 Other chronic pain: Secondary | ICD-10-CM

## 2018-03-12 NOTE — Patient Instructions (Signed)
BEFORE YOU LEAVE: -follow up: with me or Dr. Tamala Julian in 2-3 weeks  Can try heat (rice sock), tiger balm, gentle home exercises, walking in the pool.  Can repeat OMM in 1-2 weeks if needed.  I hope you are feeling better soon! Seek care promptly if your symptoms worsen, new concerns arise or you are not improving with treatment.

## 2018-03-12 NOTE — Progress Notes (Signed)
HPI:  Using dictation device. Unfortunately this device frequently misinterprets words/phrases.  Acute visit for back pain: -has chronic back and neck pain and usually sees Dr. Tamala Julian in Sports Medicine for this,but reports he did not have an opening today -initial issues many years a go and had MRI then, reports has DDD -this flare started 1 week ago, can not think of inciting event, carries heavy back pack -pain is mod in low back L > R - worse with sitting and getting up or certain movements, actually better with staying active -denies radiation, weakness, numbness, bowel or bladder incontinence, fevers malaise -prefers to avoid meds, nsaids irritate her stomach, reports severe reactions to muscle relaxers and prefers to avoid prednisone - though helped remotely  ROS: See pertinent positives and negatives per HPI.  Past Medical History:  Diagnosis Date  . Allergy   . Arthritis    osteoarthritis; neck, R shoulder  . Atypical chest pain    has had prior cardiac eval in the past/ started 4 years ago/ corrected by back adjustmment  . Complication of anesthesia    Per pt, sensitive to sedation!  . Depression    related to stress from prior job; no medications, hospitalizations or SI  . DUB (dysfunctional uterine bleeding) 07/22/2015  . Essential tremor 07/22/2015   left hand  . GERD (gastroesophageal reflux disease)   . Hemorrhoids   . Hyperglycemia    Pre-diabetic per pt!  . Hyperlipidemia   . Seasonal allergies 07/22/2015  . Vertigo    resolves with chiropratic; and sees Dr. Tamala Julian    Past Surgical History:  Procedure Laterality Date  . COLONOSCOPY    . GANGLION CYST EXCISION     right wrist  . HYSTEROSCOPY    . TONSILLECTOMY AND ADENOIDECTOMY  1971/1972  . UPPER GASTROINTESTINAL ENDOSCOPY      Family History  Problem Relation Age of Onset  . Diabetes Mother   . Macular degeneration Mother   . Colon polyps Mother   . Heart disease Father   . Diabetes Father   .  Macular degeneration Maternal Grandmother   . Colon cancer Neg Hx   . Esophageal cancer Neg Hx   . Stomach cancer Neg Hx   . Rectal cancer Neg Hx     SOCIAL HX: se ehpi   Current Outpatient Medications:  .  Cholecalciferol (VITAMIN D PO), Take by mouth., Disp: , Rfl:  .  fluticasone (FLONASE) 50 MCG/ACT nasal spray, Place 2 sprays into both nostrils daily., Disp: , Rfl:  .  Lactobacillus Rhamnosus, GG, (CULTURELLE PO), Take by mouth daily., Disp: , Rfl:  .  LUTEIN PO, Take by mouth., Disp: , Rfl:  .  metFORMIN (GLUCOPHAGE) 500 MG tablet, Take 1 tablet (500 mg total) by mouth daily with breakfast., Disp: 90 tablet, Rfl: 3 .  Multiple Vitamins-Minerals (MULTIVITAMIN ADULT PO), Take by mouth., Disp: , Rfl:  .  Omega-3 Fatty Acids (FISH OIL PO), Take by mouth., Disp: , Rfl:  .  omeprazole (PRILOSEC) 40 MG capsule, TAKE 1 CAPSULE BY MOUTH EVERY DAY, Disp: 90 capsule, Rfl: 1 .  rosuvastatin (CRESTOR) 5 MG tablet, Take 1 tablet (5 mg total) by mouth daily., Disp: 90 tablet, Rfl: 3 .  TURMERIC PO, Take 1 capsule by mouth daily. , Disp: , Rfl:   Current Facility-Administered Medications:  .  0.9 %  sodium chloride infusion, 500 mL, Intravenous, Once, Danis, Kirke Corin, MD  EXAM:  Vitals:   03/12/18 0946  BP: 120/80  Pulse: 82  Temp: 98.4 F (36.9 C)    Body mass index is 29.94 kg/m.  GENERAL: vitals reviewed and listed above, alert, oriented, appears well hydrated and in no acute distress  HEENT: atraumatic, conjunttiva clear, no obvious abnormalities on inspection of external nose and ears  NECK: no obvious masses on inspection  LUNGS: clear to auscultation bilaterally, no wheezes, rales or rhonchi, good air movement  CV: HRRR, no peripheral edema  MS: moves all extremities without noticeable abnormality Normal Gait Normal inspection of back, no obvious scoliosis or leg length descrepancy No bony TTP Soft tissue TTP at: L > R lumabar paraspinal tissues L 3- L5, bilat SI  joints, bilat upper glutes -/+ tests: neg trendelenburg,-facet loading, -SLRT, -CLRT, -FABER, -FADIR Normal muscle strength, sensation to light touch and DTRs in LEs bilaterally R ant innominate, L3-4 RlSr, post L 3 TP L , UP L5 TP left,    PSYCH: pleasant and cooperative, no obvious depression or anxiety  ASSESSMENT AND PLAN:  Discussed the following assessment and plan:  Chronic left-sided low back pain without sciatica  Somatic dysfunction of lumbar region  Somatic dysfunction of pelvis region  -we discussed possible serious and likely etiologies, workup and treatment, treatment risks and return precautions - suspect musculoskeletal back pain most likely, somatic dysfunction on exam -after this discussion, Anvitha opted for OMM today (see below), symptomatic care with heat and topical menthol, prn tylenol in safe dosing, HEP -follow up advised 1-2 weeks for OMM if needed, follow up regardless in 3-4 weeks, sooner if needed with me or Dr. Tamala Julian -of course, we advised Jalaina  to return or notify a doctor immediately if symptoms worsen or persist or new concerns arise. Advised would need imaging and further eval if worsening or not improving.  PROCEDURE NOTE : OSTEOPATHIC TREATMENT The decision today to treat with gentle Osteopathic Manipulative Therapy  (OMT) was based on physical exam findings, diagnoses and patient wishes. No Cervical HVLA manipulation was performed. After consent was obtained, treatment was  performed as below:      Regions treated:  Pelvic, lumbar, sacral     Techniques used: ME, BLT, MR, Counterstrain The patient tolerated the treatment well and reported Improved  symptoms following treatment today. Follow up treatment was advised in: 1-2 weeks   Patient Instructions  BEFORE YOU LEAVE: -follow up: with me or Dr. Tamala Julian in 2-3 weeks  Can try heat (rice sock), tiger balm, gentle home exercises, walking in the pool.  Can repeat OMM in 1-2 weeks  if needed.  I hope you are feeling better soon! Seek care promptly if your symptoms worsen, new concerns arise or you are not improving with treatment.      [name], DO

## 2018-03-18 ENCOUNTER — Encounter: Payer: Self-pay | Admitting: Family Medicine

## 2018-03-18 ENCOUNTER — Ambulatory Visit: Payer: BC Managed Care – PPO | Admitting: Family Medicine

## 2018-03-18 VITALS — BP 108/80 | HR 77 | Temp 97.9°F | Ht 65.0 in | Wt 181.8 lb

## 2018-03-18 DIAGNOSIS — M545 Low back pain, unspecified: Secondary | ICD-10-CM | POA: Insufficient documentation

## 2018-03-18 MED ORDER — PREDNISONE 5 MG PO TABS: ORAL_TABLET | ORAL | 0 refills | Status: DC

## 2018-03-18 NOTE — Progress Notes (Signed)
Kendra Austin - 53 y.o. female MRN [id number]  Date of birth: [birth year]  SUBJECTIVE:  Including CC & ROS.  Chief Complaint  Patient presents with  . Pain    lower back pain mostly on left side/constant spasms/ 1 week     REMA LIEVANOS is a 53 y.o. female that is  Presenting with b/l lower back pain. Pain has been ongoing for one week. This is different than her normal back pain. She gets regular adjustments. This pain is in the lower paraspinal region. Worse in the morning and after sitting for a prolonged period of time. No radicular symptoms. Was improving last week and got acutely worse over the weekend. Intermittent in nature. Sharp and stabbing pain. 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. No numbness.     Review of Systems  Constitutional: Negative for fever.  HENT: Negative for congestion.   Respiratory: Negative for cough.   Cardiovascular: Negative for chest pain.  Gastrointestinal: Negative for abdominal pain.  Musculoskeletal: Positive for back pain.  Neurological: Positive for weakness.  Hematological: Negative for adenopathy.  Psychiatric/Behavioral: Negative for agitation.    HISTORY: Past Medical, Surgical, Social, and Family History Reviewed & Updated per EMR.   Pertinent Historical Findings include:  Past Medical History:  Diagnosis Date  . Allergy   . Arthritis    osteoarthritis; neck, R shoulder  . Atypical chest pain    has had prior cardiac eval in the past/ started 4 years ago/ corrected by back adjustmment  . Complication of anesthesia    Per pt, sensitive to sedation!  . Depression    related to stress from prior job; no medications, hospitalizations or SI  . DUB (dysfunctional uterine bleeding) 07/22/2015  . Essential tremor 07/22/2015   left hand  . GERD (gastroesophageal reflux disease)   . Hemorrhoids   . Hyperglycemia    Pre-diabetic per pt!  . Hyperlipidemia   . Seasonal allergies 07/22/2015  . Vertigo    resolves with chiropratic; and sees  Dr. Tamala Julian    Past Surgical History:  Procedure Laterality Date  . COLONOSCOPY    . GANGLION CYST EXCISION     right wrist  . HYSTEROSCOPY    . TONSILLECTOMY AND ADENOIDECTOMY  1971/1972  . UPPER GASTROINTESTINAL ENDOSCOPY      Allergies  Allergen Reactions  . Benadryl [Diphenhydramine Hcl (Sleep)]     Dizzy, extremely drowsy, can cause a lot effects per pt!  . Other     Muscle relaxers-per patient she "cannot function"  . Aleve [Naproxen Sodium]     Heart palpitations - can take ibuprofen  . Flexeril [Cyclobenzaprine] Other (See Comments)    Makes her extremely tired  . Meloxicam     Made her depressed    Family History  Problem Relation Age of Onset  . Diabetes Mother   . Macular degeneration Mother   . Colon polyps Mother   . Heart disease Father   . Diabetes Father   . Macular degeneration Maternal Grandmother   . Colon cancer Neg Hx   . Esophageal cancer Neg Hx   . Stomach cancer Neg Hx   . Rectal cancer Neg Hx      Social History   Socioeconomic History  . Marital status: Single    Spouse name: Not on file  . Number of children: Not on file  . Years of education: Not on file  . Highest education level: Not on file  Occupational History  . Not on  file  Social Needs  . Financial resource strain: Not on file  . Food insecurity:    Worry: Not on file    Inability: Not on file  . Transportation needs:    Medical: Not on file    Non-medical: Not on file  Tobacco Use  . Smoking status: Never Smoker  . Smokeless tobacco: Never Used  Substance and Sexual Activity  . Alcohol use: No    Alcohol/week: 0.0 standard drinks  . Drug use: No  . Sexual activity: Not on file  Lifestyle  . Physical activity:    Days per week: Not on file    Minutes per session: Not on file  . Stress: Not on file  Relationships  . Social connections:    Talks on phone: Not on file    Gets together: Not on file    Attends religious service: Not on file    Active member of  club or organization: Not on file    Attends meetings of clubs or organizations: Not on file    Relationship status: Not on file  . Intimate partner violence:    Fear of current or ex partner: Not on file    Emotionally abused: Not on file    Physically abused: Not on file    Forced sexual activity: Not on file  Other Topics Concern  . Not on file  Social History Narrative   Work or School: Hydrologist; Soil scientist and pedagogy       Home Situation:       Spiritual Beliefs:       Lifestyle: no regular exercise; diet is pretty good - avoids gluten           PHYSICAL EXAM:  VS: BP 108/80   Pulse 77   Temp 97.9 F (36.6 C) (Oral)   Ht 5\' 5"  (1.651 m)   Wt 181 lb 12.8 oz (82.5 kg)   SpO2 99%   BMI 30.25 kg/m  Physical Exam Gen: NAD, alert, cooperative with exam, well-appearing ENT: normal lips, normal nasal mucosa,  Eye: normal EOM, normal conjunctiva and lids CV:  no edema, +2 pedal pulses   Resp: no accessory muscle use, non-labored,  Skin: no rashes, no areas of induration  Neuro: normal tone, normal sensation to touch Psych:  normal insight, alert and oriented MSK:  Back:  TTP of the left and right lower back paraspinal muscles  No midline spine tenderness  Normal IR and ER of the hips  Normal strength to resistance with hip flexion, knee flexion and extension, plantarflexion and dorsalflexion  Negative SLR b/l  Neurovascularly intact       ASSESSMENT & PLAN:   Acute bilateral low back pain without sciatica Seems to be more spasm in nature. Feels different than her regular back pain.  - prednisone  - counseled on HEP and supportive care - if no improvement consider imaging or trigger point injections.

## 2018-03-18 NOTE — Assessment & Plan Note (Signed)
Seems to be more spasm in nature. Feels different than her regular back pain.  - prednisone  - counseled on HEP and supportive care - if no improvement consider imaging or trigger point injections.

## 2018-03-18 NOTE — Patient Instructions (Signed)
Nice to meet you  Please try the stretches and exercises. Please perform these before you get up in the morning  Please try heat on the area.  Please try Aspercreme with lidocaine and salon pause. Please follow-up with me in 2 to 3 weeks if no better

## 2018-03-19 ENCOUNTER — Ambulatory Visit: Payer: Self-pay | Admitting: Family Medicine

## 2018-03-19 NOTE — Progress Notes (Signed)
HPI:  Using dictation device. Unfortunately this device frequently misinterprets words/phrases.  Follow up Back Pain: -saw sports med 03/18/18 - per notes felt to be spasm and prednisone rxd along with OTC options and HEP -reports today: she did not take the prednisone, has been using heat, walking in the pool and used aleve yesterday and actually is doing much better, still some pain primarily in the L low back without radiation, weakness, numbness, fevers  History (see visit 03/12/18) for details -has chronic back and neck pain and usually sees Dr. Tamala Julian in Sports Medicine for this -initial issues many years a go and had MRI then, reports has DDD -pain is mod in low back L > R - worse with sitting and getting up or certain movements, actually better with staying active -denies radiation, weakness, numbness, bowel or bladder incontinence, fevers malaise -prefers to avoid meds, nsaids irritate her stomach, reports severe reactions to muscle relaxers and prefers to avoid prednisone - though helped remotely  ROS: See pertinent positives and negatives per HPI.  Past Medical History:  Diagnosis Date  . Allergy   . Arthritis    osteoarthritis; neck, R shoulder  . Atypical chest pain    has had prior cardiac eval in the past/ started 4 years ago/ corrected by back adjustmment  . Complication of anesthesia    Per pt, sensitive to sedation!  . Depression    related to stress from prior job; no medications, hospitalizations or SI  . DUB (dysfunctional uterine bleeding) 07/22/2015  . Essential tremor 07/22/2015   left hand  . GERD (gastroesophageal reflux disease)   . Hemorrhoids   . Hyperglycemia    Pre-diabetic per pt!  . Hyperlipidemia   . Seasonal allergies 07/22/2015  . Vertigo    resolves with chiropratic; and sees Dr. Tamala Julian    Past Surgical History:  Procedure Laterality Date  . COLONOSCOPY    . GANGLION CYST EXCISION     right wrist  . HYSTEROSCOPY    . TONSILLECTOMY AND  ADENOIDECTOMY  1971/1972  . UPPER GASTROINTESTINAL ENDOSCOPY      Family History  Problem Relation Age of Onset  . Diabetes Mother   . Macular degeneration Mother   . Colon polyps Mother   . Heart disease Father   . Diabetes Father   . Macular degeneration Maternal Grandmother   . Colon cancer Neg Hx   . Esophageal cancer Neg Hx   . Stomach cancer Neg Hx   . Rectal cancer Neg Hx     SOCIAL HX: see hpi   Current Outpatient Medications:  .  Cholecalciferol (VITAMIN D PO), Take by mouth., Disp: , Rfl:  .  fluticasone (FLONASE) 50 MCG/ACT nasal spray, Place 2 sprays into both nostrils daily., Disp: , Rfl:  .  Lactobacillus Rhamnosus, GG, (CULTURELLE PO), Take by mouth daily., Disp: , Rfl:  .  LUTEIN PO, Take by mouth., Disp: , Rfl:  .  metFORMIN (GLUCOPHAGE) 500 MG tablet, Take 1 tablet (500 mg total) by mouth daily with breakfast., Disp: 90 tablet, Rfl: 3 .  Multiple Vitamins-Minerals (MULTIVITAMIN ADULT PO), Take by mouth., Disp: , Rfl:  .  Omega-3 Fatty Acids (FISH OIL PO), Take by mouth., Disp: , Rfl:  .  omeprazole (PRILOSEC) 40 MG capsule, TAKE 1 CAPSULE BY MOUTH EVERY DAY, Disp: 90 capsule, Rfl: 1 .  rosuvastatin (CRESTOR) 5 MG tablet, Take 1 tablet (5 mg total) by mouth daily., Disp: 90 tablet, Rfl: 3 .  TURMERIC PO, Take 1  capsule by mouth daily. , Disp: , Rfl:   Current Facility-Administered Medications:  .  0.9 %  sodium chloride infusion, 500 mL, Intravenous, Once, Danis, Estill Cotta III, MD  EXAM:  Vitals:   03/21/18 0658  BP: 102/68  Pulse: 81  Temp: 98.3 F (36.8 C)    Body mass index is 30.25 kg/m.  GENERAL: vitals reviewed and listed above, alert, oriented, appears well hydrated and in no acute distress  HEENT: atraumatic, conjunttiva clear, no obvious abnormalities on inspection of external nose and ears  NECK: no obvious masses on inspection  MS: moves all extremities without noticeable abnormality - gait normal, normal functions of upper and lower  extremities on gross exam, neg SLRT and CLRT, T 3-4 RrSi, R SBS SBR strain,  head forward posture with R shoulder elevated, R ant inominate, L tib/fib int rot, TI Rl, OA Rl, L3 post tenderpoint, R uni sacral ext  PSYCH: pleasant and cooperative, no obvious depression or anxiety  ASSESSMENT AND PLAN:  Discussed the following assessment and plan:  Chronic left-sided low back pain without sciatica  Somatic dysfunction of lumbar region  Somatic dysfunction of lower extremity  Somatic dysfunction of pelvis region  Somatic dysfunction of thoracic region  Somatic dysfunction of cervical region  Somatic dysfunction of head region  Somatic dysfunction of sacral region  -we discussed possible serious and likely etiologies, workup and treatment, treatment risks and return precautions; possible muscular strain vs DDD low back seeing sports medicine with somatic dysfunction; requessing OMM -after this discussion, Kendra Austin opted for OMM, also discussed symptomatic care and she prefers to avoid medications as much as possible, heat, stretching, postural exercise, sleep position, topical options discussed, OMM per below -follow up advised with me or sports med in 2 weeks if persist -of course, we advised Kendra Austin  to return or notify a doctor immediately if symptoms worsen or persist or new concerns arise.  PROCEDURE NOTE : OSTEOPATHIC TREATMENT The decision today to treat with gentle Osteopathic Manipulative Therapy  (OMT) was based on physical exam findings, diagnoses and patient wishes. No Cervical HVLA manipulation was performed. After consent was obtained, treatment was  performed as below:      Regions treated:  Sacrum, thoracic, pelvic, LE, lumbar, thoracic, cerv, head     Techniques used: ME, MR, counterstrain, BLT, cranial The patient tolerated the treatment well and reported Improved  symptoms following treatment today. Follow up treatment was advised in: 1-2 weeks  There  are no Patient Instructions on file for this visit.  Kendra Kern, DO

## 2018-03-19 NOTE — Telephone Encounter (Signed)
Gertie Fey may be better able to assist? If doing better may want to consult Dr. Tamala Julian to see if she can hold off on taking it?

## 2018-03-19 NOTE — Telephone Encounter (Signed)
Patient was seen by Dr Raeford Razor for her back pain yesterday and he prescribed her prednisone for 6 days. Patient was told that she would have to weigh risk of GERD upset vs benefit of medication. Patient is is good control of her symptoms on 40 mg of her medication. She is doing the stretches as recommended and she does find them helpful for her back pain.  Her question is : Is there anything recommended if she takes the prednisone and has GERD flare- she wants her PCP advisement before she starts it.  Reason for Disposition . Caller requesting a NON-URGENT new prescription or refill and triager unable to refill per unit policy    Patient is calling for advisement on use of prednisone.  Answer Assessment - Initial Assessment Questions 1. SYMPTOMS: "Do you have any symptoms?"     Back pain 2. SEVERITY: If symptoms are present, ask "Are they mild, moderate or severe?"     Was severe yesterday- today some better with stretches recommended.  Protocols used: MEDICATION QUESTION CALL-A-AH

## 2018-03-19 NOTE — Telephone Encounter (Signed)
I called the pt and informed her of the message below.  She agreed to call her GI doctor and Dr Raeford Razor office for recommendations as she is feeling better today.

## 2018-03-21 ENCOUNTER — Ambulatory Visit: Payer: BC Managed Care – PPO | Admitting: Family Medicine

## 2018-03-21 ENCOUNTER — Encounter: Payer: Self-pay | Admitting: Family Medicine

## 2018-03-21 VITALS — BP 102/68 | HR 81 | Temp 98.3°F | Ht 65.0 in

## 2018-03-21 DIAGNOSIS — M9903 Segmental and somatic dysfunction of lumbar region: Secondary | ICD-10-CM | POA: Diagnosis not present

## 2018-03-21 DIAGNOSIS — M9902 Segmental and somatic dysfunction of thoracic region: Secondary | ICD-10-CM

## 2018-03-21 DIAGNOSIS — M9906 Segmental and somatic dysfunction of lower extremity: Secondary | ICD-10-CM | POA: Diagnosis not present

## 2018-03-21 DIAGNOSIS — M545 Low back pain, unspecified: Secondary | ICD-10-CM

## 2018-03-21 DIAGNOSIS — M9905 Segmental and somatic dysfunction of pelvic region: Secondary | ICD-10-CM | POA: Diagnosis not present

## 2018-03-21 DIAGNOSIS — M9901 Segmental and somatic dysfunction of cervical region: Secondary | ICD-10-CM

## 2018-03-21 DIAGNOSIS — M9904 Segmental and somatic dysfunction of sacral region: Secondary | ICD-10-CM

## 2018-03-21 DIAGNOSIS — G8929 Other chronic pain: Secondary | ICD-10-CM | POA: Diagnosis not present

## 2018-03-21 DIAGNOSIS — M9900 Segmental and somatic dysfunction of head region: Secondary | ICD-10-CM

## 2018-03-26 ENCOUNTER — Ambulatory Visit: Payer: BC Managed Care – PPO | Admitting: Family Medicine

## 2018-04-26 ENCOUNTER — Telehealth: Payer: Self-pay | Admitting: *Deleted

## 2018-04-26 NOTE — Telephone Encounter (Signed)
Error

## 2018-04-30 ENCOUNTER — Other Ambulatory Visit: Payer: Self-pay | Admitting: *Deleted

## 2018-04-30 ENCOUNTER — Other Ambulatory Visit: Payer: Self-pay

## 2018-04-30 ENCOUNTER — Ambulatory Visit (INDEPENDENT_AMBULATORY_CARE_PROVIDER_SITE_OTHER): Payer: BC Managed Care – PPO | Admitting: Family Medicine

## 2018-04-30 ENCOUNTER — Ambulatory Visit: Payer: BC Managed Care – PPO | Admitting: Family Medicine

## 2018-04-30 ENCOUNTER — Encounter: Payer: Self-pay | Admitting: Family Medicine

## 2018-04-30 DIAGNOSIS — E1165 Type 2 diabetes mellitus with hyperglycemia: Secondary | ICD-10-CM

## 2018-04-30 DIAGNOSIS — R739 Hyperglycemia, unspecified: Secondary | ICD-10-CM

## 2018-04-30 DIAGNOSIS — Z6830 Body mass index (BMI) 30.0-30.9, adult: Secondary | ICD-10-CM

## 2018-04-30 DIAGNOSIS — E785 Hyperlipidemia, unspecified: Secondary | ICD-10-CM

## 2018-04-30 NOTE — Progress Notes (Signed)
Virtual Visit via Video Note  I connected with Kendra Austin on 04/30/18 at  3:00 PM EDT by a video enabled telemedicine application and verified that I am speaking with the correct person using two identifiers.  Location patient: home Location provider:work or home office Persons participating in the virtual visit: patient, provider  I discussed the limitations of evaluation and management by telemedicine and the availability of in person appointments. The patient expressed understanding and agreed to proceed.   HPI:  Reports back is doing good - fine now. Walking daily, doing pilates, doing stationary bike. Bought a total gym and will be starting this as well. Has cut sugar out of her diet recently. But, diet has not been great. Some starches and potatoes, but better then in the past. Her weight today. No CP, SOB, malaise, polyuria. She  prefers to monitor her sugars at home and not come in for labs in the [place] pandemic. She is staying home and working from home.    ROS: See pertinent positives and negatives per HPI.  Past Medical History:  Diagnosis Date  . Allergy   . Arthritis    osteoarthritis; neck, R shoulder  . Atypical chest pain    has had prior cardiac eval in the past/ started 4 years ago/ corrected by back adjustmment  . Complication of anesthesia    Per pt, sensitive to sedation!  . Depression    related to stress from prior job; no medications, hospitalizations or SI  . DUB (dysfunctional uterine bleeding) 07/22/2015  . Essential tremor 07/22/2015   left hand  . GERD (gastroesophageal reflux disease)   . Hemorrhoids   . Hyperglycemia    Pre-diabetic per pt!  . Hyperlipidemia   . Seasonal allergies 07/22/2015  . Vertigo    resolves with chiropratic; and sees Dr. Tamala Julian    Past Surgical History:  Procedure Laterality Date  . COLONOSCOPY    . GANGLION CYST EXCISION     right wrist  . HYSTEROSCOPY    . TONSILLECTOMY AND ADENOIDECTOMY  1971/1972  . UPPER  GASTROINTESTINAL ENDOSCOPY      Family History  Problem Relation Age of Onset  . Diabetes Mother   . Macular degeneration Mother   . Colon polyps Mother   . Heart disease Father   . Diabetes Father   . Macular degeneration Maternal Grandmother   . Colon cancer Neg Hx   . Esophageal cancer Neg Hx   . Stomach cancer Neg Hx   . Rectal cancer Neg Hx     SOCIAL HX: see hpi   Current Outpatient Medications:  .  Cholecalciferol (VITAMIN D PO), Take by mouth., Disp: , Rfl:  .  fluticasone (FLONASE) 50 MCG/ACT nasal spray, Place 2 sprays into both nostrils daily., Disp: , Rfl:  .  Lactobacillus Rhamnosus, GG, (CULTURELLE PO), Take by mouth daily., Disp: , Rfl:  .  LUTEIN PO, Take by mouth., Disp: , Rfl:  .  metFORMIN (GLUCOPHAGE) 500 MG tablet, Take 1 tablet (500 mg total) by mouth daily with breakfast., Disp: 90 tablet, Rfl: 3 .  Multiple Vitamins-Minerals (MULTIVITAMIN ADULT PO), Take by mouth., Disp: , Rfl:  .  Omega-3 Fatty Acids (FISH OIL PO), Take by mouth., Disp: , Rfl:  .  omeprazole (PRILOSEC) 40 MG capsule, TAKE 1 CAPSULE BY MOUTH EVERY DAY, Disp: 90 capsule, Rfl: 1 .  rosuvastatin (CRESTOR) 5 MG tablet, Take 1 tablet (5 mg total) by mouth daily., Disp: 90 tablet, Rfl: 3 .  TURMERIC PO, Take  1 capsule by mouth daily. , Disp: , Rfl:   Current Facility-Administered Medications:  .  0.9 %  sodium chloride infusion, 500 mL, Intravenous, Once, Danis, Kirke Corin, MD  EXAM:  VITALS per patient if applicable: [id number]  GENERAL: alert, oriented, appears well and in no acute distress  HEENT: atraumatic, conjunttiva clear, no obvious abnormalities on inspection of external nose and ears  NECK: normal movements of the head and neck  LUNGS: on inspection no signs of respiratory distress, breathing rate appears normal, no obvious gross SOB, gasping or wheezing  CV: no obvious cyanosis  MS: moves all visible extremities without noticeable abnormality  PSYCH/NEURO: pleasant and  cooperative, no obvious depression or anxiety, speech and thought processing grossly intact  ASSESSMENT AND PLAN:  Discussed the following assessment and plan:  Hyperlipidemia, unspecified hyperlipidemia type  Hyperglycemia  BMI 30.0-30.9,adult  Type 2 diabetes mellitus with hyperglycemia, without long-term current use of insulin (HCC)  Lifestyle recs discussed at length. Congratulated on healthy changes.  She prefers to not come to the office for labs in light of the the [place] pandemic. She has a glucose monitor and agrees to check fasting blood sugars and call us with readings in 1-2 weeks.  She has plenty of refills.  She plans to establish with Dr. Juleen China after the research she did online. She prefers a female and a DO. She agrees to let us know if she has any difficulty getting in there and we will set her up with one of our providers here.   I discussed the assessment and treatment plan with the patient. The patient was provided an opportunity to ask questions and all were answered. The patient agreed with the plan and demonstrated an understanding of the instructions.   The patient was advised to call back or seek an in-person evaluation if the symptoms worsen or if the condition fails to improve as anticipated.   [name], DO

## 2018-05-06 ENCOUNTER — Telehealth: Payer: Self-pay | Admitting: Family Medicine

## 2018-05-06 ENCOUNTER — Ambulatory Visit: Payer: BC Managed Care – PPO | Admitting: Family Medicine

## 2018-05-06 NOTE — Telephone Encounter (Signed)
Copied from [place] [id number]. Topic: General - Other >> May 03, 2018 11:02 AM Carolyn Stare wrote:  Pt would like to [place] from Dr Maudie Mercury ,she would like to have a DO

## 2018-05-06 NOTE — Telephone Encounter (Signed)
Pt called and left message on [place] that she is returning call.

## 2018-05-06 NOTE — Telephone Encounter (Signed)
Called pt and left vm to schedule appt

## 2018-05-07 NOTE — Telephone Encounter (Signed)
Dr Maudie Mercury is no longer practicing at [facility] as of April 30th.  We do not have any other DO in office once she is gone.

## 2018-05-08 ENCOUNTER — Encounter: Payer: Self-pay | Admitting: Family Medicine

## 2018-05-08 ENCOUNTER — Ambulatory Visit (INDEPENDENT_AMBULATORY_CARE_PROVIDER_SITE_OTHER): Payer: BC Managed Care – PPO | Admitting: Family Medicine

## 2018-05-08 ENCOUNTER — Other Ambulatory Visit: Payer: Self-pay

## 2018-05-08 VITALS — Temp 98.6°F | Ht 65.0 in | Wt 173.6 lb

## 2018-05-08 DIAGNOSIS — E1169 Type 2 diabetes mellitus with other specified complication: Secondary | ICD-10-CM

## 2018-05-08 DIAGNOSIS — E785 Hyperlipidemia, unspecified: Secondary | ICD-10-CM | POA: Diagnosis not present

## 2018-05-08 DIAGNOSIS — R1013 Epigastric pain: Secondary | ICD-10-CM | POA: Diagnosis not present

## 2018-05-08 NOTE — Progress Notes (Addendum)
Virtual Visit via Video   I connected with Kendra Austin by a video enabled telemedicine application and verified that I am speaking with the correct person using two identifiers. Location patient: Home Location provider: [facility], Office Persons participating in the virtual visit: Kendra Austin, [place], DO Kendra Austin, CMA acting as scribe for Dr. [name].   I discussed the limitations of evaluation and management by telemedicine and the availability of in person appointments. The patient expressed understanding and agreed to proceed.  Subjective:   Kendra Austin was seen by Dr. Maudie Mercury in the past. She is no longer going to be seeing patients. She is also treated by Dr. Tamala Julian. She does pilates for help with back pain.  She sees him about every three months for OMT treatment.  Diabetes Medication compliance: compliant all of the time, diabetic diet compliance: compliant most of the time, home glucose monitoring: is performed sporadically, further diabetic ROS: no polyuria or polydipsia, no chest pain, dyspnea or TIA's, no numbness, tingling or pain in extremities.  Lab Results  Component Value Date   HGBA1C 7.1 (H) 01/31/2018   Lab Results  Component Value Date   CHOL 189 01/31/2018   HDL 46.40 01/31/2018   LDLCALC 121 (H) 01/31/2018   TRIG 111.0 01/31/2018   CHOLHDL 4 01/31/2018   Lab Results  Component Value Date   CREATININE 0.82 01/31/2018   Lab Results  Component Value Date   ALT 18 07/22/2015   AST 17 07/22/2015   ALKPHOS 50 07/22/2015   BILITOT 0.4 07/22/2015   Dyspepsia. Controlled with PPI.  Reviewed all precautions and expectations with prevention of Covid-19.  Review of Systems  Constitutional: Negative for chills, fever, malaise/fatigue and weight loss.  Respiratory: Negative for cough, shortness of breath and wheezing.   Cardiovascular: Negative for chest pain, palpitations and leg swelling.  Gastrointestinal: Negative for abdominal  pain, constipation, diarrhea, nausea and vomiting.  Genitourinary: Negative for dysuria and urgency.  Musculoskeletal: Negative for joint pain and myalgias.  Skin: Negative for rash.  Neurological: Negative for dizziness and headaches.  Psychiatric/Behavioral: Negative for depression, substance abuse and suicidal ideas. The patient is not nervous/anxious.    Patient Active Problem List   Diagnosis Date Noted  . Type 2 diabetes mellitus with other specified complication ([place]) 56/31/4970  . BMI 30.0-30.9,adult 11/23/2016  . Hyperlipidemia associated with type 2 diabetes mellitus ([place]) 03/27/2016  . Essential tremor 07/22/2015  . Seasonal allergies 07/22/2015  . DUB (dysfunctional uterine bleeding) 07/22/2015  . Posture imbalance 11/26/2014  . Nonallopathic lesion of cervical region 11/26/2014  . Nonallopathic lesion of thoracic region 11/26/2014  . Nonallopathic lesion of lumbosacral region 11/26/2014    Social History   Tobacco Use  . Smoking status: Never Smoker  . Smokeless tobacco: Never Used  Substance Use Topics  . Alcohol use: No    Alcohol/week: 0.0 standard drinks   Current Outpatient Medications:  .  Cholecalciferol (VITAMIN D PO), Take by mouth., Disp: , Rfl:  .  fluticasone (FLONASE) 50 MCG/ACT nasal spray, Place 2 sprays into both nostrils daily., Disp: , Rfl:  .  Lactobacillus Rhamnosus, GG, (CULTURELLE PO), Take by mouth daily., Disp: , Rfl:  .  LUTEIN PO, Take by mouth., Disp: , Rfl:  .  metFORMIN (GLUCOPHAGE) 500 MG tablet, Take 1 tablet (500 mg total) by mouth daily with breakfast., Disp: 90 tablet, Rfl: 3 .  Multiple Vitamins-Minerals (MULTIVITAMIN ADULT PO), Take by mouth., Disp: , Rfl:  .  Omega-3  Fatty Acids (FISH OIL PO), Take by mouth., Disp: , Rfl:  .  omeprazole (PRILOSEC) 40 MG capsule, TAKE 1 CAPSULE BY MOUTH EVERY DAY, Disp: 90 capsule, Rfl: 1 .  rosuvastatin (CRESTOR) 5 MG tablet, Take 1 tablet (5 mg total) by mouth daily., Disp: 90 tablet, Rfl: 3 .   TURMERIC PO, Take 1 capsule by mouth daily. , Disp: , Rfl:   Allergies  Allergen Reactions  . Benadryl [Diphenhydramine Hcl (Sleep)]     Dizzy, extremely drowsy, can cause a lot effects per pt!  . Other     Muscle relaxers-per patient she "cannot function"  . Aleve [Naproxen Sodium]     Heart palpitations - can take ibuprofen  . Flexeril [Cyclobenzaprine] Other (See Comments)    Makes her extremely tired  . Meloxicam     Made her depressed   Objective:   VITALS: Per patient if applicable, see vitals. GENERAL: Alert, appears well and in no acute distress. HEENT: Atraumatic, conjunctiva clear, no obvious abnormalities on inspection of external nose and ears. NECK: Normal movements of the head and neck. CARDIOPULMONARY: No increased WOB. Speaking in clear sentences. I:E ratio WNL.  MS: Moves all visible extremities without noticeable abnormality. PSYCH: Pleasant and cooperative, well-groomed. Speech normal rate and rhythm. Affect is appropriate. Insight and judgement are appropriate. Attention is focused, linear, and appropriate.  NEURO: CN grossly intact. Oriented as arrived to appointment on time with no prompting. Moves both UE equally.  SKIN: No obvious lesions, wounds, erythema, or cyanosis noted on face or hands.  Assessment and Plan:   [name] was seen today for transitions of care.  Diagnoses and all orders for this visit:  Hyperlipidemia associated with type 2 diabetes mellitus ([place]) -     rosuvastatin (CRESTOR) 5 MG tablet; Take 1 tablet (5 mg total) by mouth daily.  Epigastric pain -     omeprazole (PRILOSEC) 40 MG capsule; TAKE 1 CAPSULE BY MOUTH EVERY DAY  Type 2 diabetes mellitus with other specified complication, without long-term current use of insulin (HCC) -     metFORMIN (GLUCOPHAGE) 500 MG tablet; Take 1 tablet (500 mg total) by mouth daily with breakfast.   . Reviewed expectations re: course of current medical issues. . Discussed self-management of  symptoms. . Outlined signs and symptoms indicating need for more acute intervention. . Patient verbalized understanding and all questions were answered. Marland Kitchen Health Maintenance issues including appropriate healthy diet, exercise, and smoking avoidance were discussed with patient. . See orders for this visit as documented in the electronic medical record.  [name], DO

## 2018-05-09 MED ORDER — METFORMIN HCL 500 MG PO TABS: 500.0000 mg | ORAL_TABLET | Freq: Every day | ORAL | 3 refills | Status: DC

## 2018-05-09 MED ORDER — OMEPRAZOLE 40 MG PO CPDR: DELAYED_RELEASE_CAPSULE | ORAL | 1 refills | Status: DC

## 2018-05-09 MED ORDER — ROSUVASTATIN CALCIUM 5 MG PO TABS: 5.0000 mg | ORAL_TABLET | Freq: Every day | ORAL | 3 refills | Status: DC

## 2018-05-12 ENCOUNTER — Encounter: Payer: Self-pay | Admitting: Family Medicine

## 2018-05-12 DIAGNOSIS — R1013 Epigastric pain: Secondary | ICD-10-CM | POA: Insufficient documentation

## 2018-05-12 DIAGNOSIS — E1169 Type 2 diabetes mellitus with other specified complication: Secondary | ICD-10-CM | POA: Insufficient documentation

## 2018-06-17 NOTE — Progress Notes (Signed)
[name] [address], [place] [address] Phone: [phone] Subjective:   I Kendra Austin am serving as a Education administrator for Dr. Hulan Saas.    CC: Back pain follow-up and new onset of shoulder pain  ATF:[id number]  Kendra Austin is a 53 y.o. female coming in with complaint of back pain. Has not been having pain until last night. Pain is more on the left side than the right side. With movement she does feel better.   Also having right shoulder pain. [place] one month ago. Having pain in posterior shoulder that increases when sleeping. States that she has a history of bone spurs but that her pain feels different than previously.  Patient states that certain range of motion can be uncomfortable.  Has noticed more pain at night.  Describes the pain is dull, throbbing aching sensation.  Denies any radiation going down the leg.  No numbness.    Past Medical History:  Diagnosis Date  . Allergy   . Arthritis    osteoarthritis; neck, R shoulder  . Atypical chest pain    has had prior cardiac eval in the past/ started 4 years ago/ corrected by back adjustmment  . Complication of anesthesia    Per pt, sensitive to sedation!  . Depression    related to stress from prior job; no medications, hospitalizations or SI  . DUB (dysfunctional uterine bleeding) 07/22/2015  . Essential tremor 07/22/2015   left hand  . GERD (gastroesophageal reflux disease)   . Hemorrhoids   . Hyperglycemia    Pre-diabetic per pt!  . Hyperlipidemia   . Seasonal allergies 07/22/2015  . Vertigo    resolves with chiropratic; and sees Dr. Tamala Julian   Past Surgical History:  Procedure Laterality Date  . COLONOSCOPY    . GANGLION CYST EXCISION     right wrist  . HYSTEROSCOPY    . TONSILLECTOMY AND ADENOIDECTOMY  1971/1972  . UPPER GASTROINTESTINAL ENDOSCOPY     Social History   Socioeconomic History  . Marital status: Single    Spouse name: Not on file  . Number of children: Not  on file  . Years of education: Not on file  . Highest education level: Not on file  Occupational History  . Not on file  Social Needs  . Financial resource strain: Not on file  . Food insecurity:    Worry: Not on file    Inability: Not on file  . Transportation needs:    Medical: Not on file    Non-medical: Not on file  Tobacco Use  . Smoking status: Never Smoker  . Smokeless tobacco: Never Used  Substance and Sexual Activity  . Alcohol use: No    Alcohol/week: 0.0 standard drinks  . Drug use: No  . Sexual activity: Not on file  Lifestyle  . Physical activity:    Days per week: Not on file    Minutes per session: Not on file  . Stress: Not on file  Relationships  . Social connections:    Talks on phone: Not on file    Gets together: Not on file    Attends religious service: Not on file    Active member of club or organization: Not on file    Attends meetings of clubs or organizations: Not on file    Relationship status: Not on file  Other Topics Concern  . Not on file  Social History Narrative   Work or School: [employer]; teaches  spanish and pedagogy       Home Situation:       Spiritual Beliefs:       Lifestyle: no regular exercise; diet is pretty good - avoids gluten         Allergies  Allergen Reactions  . Benadryl [Diphenhydramine Hcl (Sleep)]     Dizzy, extremely drowsy, can cause a lot effects per pt!  . Other     Muscle relaxers-per patient she "cannot function"  . Aleve [Naproxen Sodium]     Heart palpitations - can take ibuprofen  . Flexeril [Cyclobenzaprine] Other (See Comments)    Makes her extremely tired  . Meloxicam     Made her depressed   Family History  Problem Relation Age of Onset  . Diabetes Mother   . Macular degeneration Mother   . Colon polyps Mother   . Heart disease Father   . Diabetes Father   . Macular degeneration Maternal Grandmother   . Colon cancer Neg Hx   . Esophageal cancer Neg Hx   . Stomach cancer Neg Hx   . Rectal  cancer Neg Hx     Current Outpatient Medications (Endocrine & Metabolic):  .  metFORMIN (GLUCOPHAGE) 500 MG tablet, Take 1 tablet (500 mg total) by mouth daily with breakfast.   Current Outpatient Medications (Cardiovascular):  .  rosuvastatin (CRESTOR) 5 MG tablet, Take 1 tablet (5 mg total) by mouth daily.   Current Outpatient Medications (Respiratory):  .  fluticasone (FLONASE) 50 MCG/ACT nasal spray, Place 2 sprays into both nostrils daily.       Current Outpatient Medications (Other):  Marland Kitchen  Cholecalciferol (VITAMIN D PO), Take by mouth. .  Lactobacillus Rhamnosus, GG, (CULTURELLE PO), Take by mouth daily. .  LUTEIN PO, Take by mouth. .  Multiple Vitamins-Minerals (MULTIVITAMIN ADULT PO), Take by mouth. .  Omega-3 Fatty Acids (FISH OIL PO), Take by mouth. Marland Kitchen  omeprazole (PRILOSEC) 40 MG capsule, TAKE 1 CAPSULE BY MOUTH EVERY DAY .  TURMERIC PO, Take 1 capsule by mouth daily.   Current Facility-Administered Medications (Other):  .  0.9 %  sodium chloride infusion    Past medical history, social, surgical and family history all reviewed in electronic medical record.  No pertanent information unless stated regarding to the chief complaint.   Review of Systems:  No headache, visual changes, nausea, vomiting, diarrhea, constipation, dizziness, abdominal pain, skin rash, fevers, chills, night sweats, weight loss, swollen lymph nodes, body aches, joint swelling,  chest pain, shortness of breath, mood changes.  Positive muscle aches  Objective  Blood pressure 110/80, pulse 77, height 5\' 5"  (1.651 m), weight 80.3 kg, SpO2 99 %.   General: No apparent distress alert and oriented x3 mood and affect normal, dressed appropriately.  HEENT: Pupils equal, extraocular movements intact  Respiratory: Patient's speak in full sentences and does not appear short of breath  Cardiovascular: No lower extremity edema, non tender, no erythema  Skin: Warm dry intact with no signs of infection or  rash on extremities or on axial skeleton.  Abdomen: Soft nontender  Neuro: Cranial nerves II through XII are intact, neurovascularly intact in all extremities with 2+ DTRs and 2+ pulses.  Lymph: No lymphadenopathy of posterior or anterior cervical chain or axillae bilaterally.  Gait normal with good balance and coordination.  MSK:  Non tender with full range of motion and good stability and symmetric strength and tone of  elbows, wrist, hip, knee and ankles bilaterally.  Neck: Inspection mild loss of  lordosis. No palpable stepoffs. Negative Spurling's maneuver. Full neck range of motion Grip strength and sensation normal in bilateral hands Strength good C4 to T1 distribution No sensory change to C4 to T1 Negative Hoffman sign bilaterally Reflexes normal Mild tightness of the right trapezius.  Right shoulder exam Still limited range of motion.  Lacks last 5 degrees of external rotation on the right.  Rotator cuff strength 5 out of 5.  Mild impingement.  Patient's low back shows some loss of lordosis.  Negative straight leg test.  No tenderness to palpation in the paraspinal musculature.  Tightness with Corky Sox on the right  Osteopathic findings  C6 flexed rotated and side bent left T3 extended rotated and side bent right inhaled third rib T9 extended rotated and side bent left L2 flexed rotated and side bent right Sacrum right on right    Impression and Recommendations:     This case required medical decision making of moderate complexity. The above documentation has been reviewed and is accurate and complete Lyndal Pulley, DO       Note: This dictation was prepared with Dragon dictation along with smaller phrase technology. Any transcriptional errors that result from this process are unintentional.

## 2018-06-18 ENCOUNTER — Ambulatory Visit (INDEPENDENT_AMBULATORY_CARE_PROVIDER_SITE_OTHER): Payer: BC Managed Care – PPO | Admitting: Family Medicine

## 2018-06-18 ENCOUNTER — Other Ambulatory Visit: Payer: Self-pay

## 2018-06-18 ENCOUNTER — Encounter: Payer: Self-pay | Admitting: Family Medicine

## 2018-06-18 VITALS — BP 110/80 | HR 77 | Ht 65.0 in | Wt 177.0 lb

## 2018-06-18 DIAGNOSIS — M999 Biomechanical lesion, unspecified: Secondary | ICD-10-CM | POA: Diagnosis not present

## 2018-06-18 DIAGNOSIS — G8929 Other chronic pain: Secondary | ICD-10-CM

## 2018-06-18 DIAGNOSIS — M25511 Pain in right shoulder: Secondary | ICD-10-CM

## 2018-06-18 DIAGNOSIS — R293 Abnormal posture: Secondary | ICD-10-CM

## 2018-06-18 NOTE — Patient Instructions (Signed)
Good to see you  Ice is your friend Ibuprofen 3 pills 3 times a day for 3 days  Stay active See me again in 10-12 weeks

## 2018-06-18 NOTE — Assessment & Plan Note (Signed)
Decision today to treat with OMT was based on Physical Exam  After verbal consent patient was treated with HVLA, ME, FPR techniques in cervical, thoracic, lumbar and sacral areas  Patient tolerated the procedure well with improvement in symptoms  Patient given exercises, stretches and lifestyle modifications  See medications in patient instructions if given  Patient will follow up in 4-12 weeks

## 2018-06-18 NOTE — Assessment & Plan Note (Signed)
Still likely posterior sinuses.  Discussed which activities to do which wants to avoid.  Patient is to increase activity slowly over the course the next several days.  Discussed icing regimen and home exercise.  Patient will follow-up with me again in 4 to 8 weeks

## 2018-06-29 NOTE — Progress Notes (Signed)
Virtual Visit via Video   Due to the COVID-19 pandemic, this visit was completed with telemedicine (audio/video) technology to reduce patient and provider exposure as well as to preserve personal protective equipment.   I connected with Kendra Austin by a video enabled telemedicine application and verified that I am speaking with the correct person using two identifiers. Location patient: Home Location provider: [facility], Office Persons participating in the virtual visit: Alyssa, [place], DO Lonell Grandchild, CMA acting as scribe for Dr. [name].   I discussed the limitations of evaluation and management by telemedicine and the availability of in person appointments. The patient expressed understanding and agreed to proceed.  Care Team   Patient Care Team: [name], DO as PCP - General (Family Medicine)  Subjective:   HPI:   1. Hyperlipidemia associated with type 2 diabetes mellitus (Kendra Austin) Is the patient taking medications without problems? Yes. Does the patient complain of muscle aches?  Yes. Trying to exercise on a regular basis? Yes. Compliant with diet? Yes.  Lab Results  Component Value Date   CHOL 189 01/31/2018   HDL 46.40 01/31/2018   LDLCALC 121 (H) 01/31/2018   TRIG 111.0 01/31/2018   CHOLHDL 4 01/31/2018   Lab Results  Component Value Date   ALT 18 07/22/2015   AST 17 07/22/2015   ALKPHOS 50 07/22/2015   BILITOT 0.4 07/22/2015      2. Type 2 diabetes mellitus with other specified complication, without long-term current use of insulin (Kendra Austin) Patient has been on Metformin for around 5 months. She has increased GI symptoms. She has had [place] on going. She would like to talk about going to extended release to see if that helps with symptoms.   3. BMI 30.0-30.9,adult Usual eating pattern includes: The patient eats a regular, healthy diet.. Usual physical activity includes doing aerobic activity 30 minutes a day 5 days per week. Current  life stressors: being in the house.   Review of Systems  Constitutional: Negative for chills, fever, malaise/fatigue and weight loss.  Respiratory: Negative for cough, shortness of breath and wheezing.   Cardiovascular: Negative for chest pain, palpitations and leg swelling.  Gastrointestinal: Negative for abdominal pain, constipation, diarrhea, nausea and vomiting.  Genitourinary: Negative for dysuria and urgency.  Musculoskeletal: Negative for joint pain and myalgias.  Skin: Negative for rash.  Neurological: Negative for dizziness and headaches.  Psychiatric/Behavioral: Negative for depression, substance abuse and suicidal ideas. The patient is not nervous/anxious.     Patient Active Problem List   Diagnosis Date Noted  . Type 2 diabetes mellitus with other specified complication (Kendra Austin) 68/34/1962  . Dyspepsia 05/12/2018  . BMI 30.0-30.9,adult 11/23/2016  . Hyperlipidemia associated with type 2 diabetes mellitus (Kendra Austin) 03/27/2016  . Essential tremor 07/22/2015  . Seasonal allergies 07/22/2015  . DUB (dysfunctional uterine bleeding) 07/22/2015  . Posture imbalance 11/26/2014  . Nonallopathic lesion of cervical region 11/26/2014  . Nonallopathic lesion of thoracic region 11/26/2014  . Nonallopathic lesion of lumbosacral region 11/26/2014    Social History   Tobacco Use  . Smoking status: Never Smoker  . Smokeless tobacco: Never Used  Substance Use Topics  . Alcohol use: No    Alcohol/week: 0.0 standard drinks   Current Outpatient Medications:  .  Cholecalciferol (VITAMIN D PO), Take by mouth., Disp: , Rfl:  .  fluticasone (FLONASE) 50 MCG/ACT nasal spray, Place 2 sprays into both nostrils daily., Disp: , Rfl:  .  Lactobacillus Rhamnosus, GG, (CULTURELLE PO),  Take by mouth daily., Disp: , Rfl:  .  LUTEIN PO, Take by mouth., Disp: , Rfl:  .  metFORMIN (GLUCOPHAGE) 500 MG tablet, Take 1 tablet (500 mg total) by mouth daily with breakfast., Disp: 90 tablet, Rfl: 3 .  Multiple  Vitamins-Minerals (MULTIVITAMIN ADULT PO), Take by mouth., Disp: , Rfl:  .  Omega-3 Fatty Acids (FISH OIL PO), Take by mouth., Disp: , Rfl:  .  omeprazole (PRILOSEC) 40 MG capsule, TAKE 1 CAPSULE BY MOUTH EVERY DAY, Disp: 90 capsule, Rfl: 1 .  rosuvastatin (CRESTOR) 5 MG tablet, Take 1 tablet (5 mg total) by mouth daily., Disp: 90 tablet, Rfl: 3 .  TURMERIC PO, Take 1 capsule by mouth daily. , Disp: , Rfl:   Allergies  Allergen Reactions  . Benadryl [Diphenhydramine Hcl (Sleep)]     Dizzy, extremely drowsy, can cause a lot effects per pt!  . Other     Muscle relaxers-per patient she "cannot function"  . Aleve [Naproxen Sodium]     Heart palpitations - can take ibuprofen  . Flexeril [Cyclobenzaprine] Other (See Comments)    Makes her extremely tired  . Meloxicam     Made her depressed    Objective:   VITALS: Per patient if applicable, see vitals. GENERAL: Alert, appears well and in no acute distress. HEENT: Atraumatic, conjunctiva clear, no obvious abnormalities on inspection of external nose and ears. NECK: Normal movements of the head and neck. CARDIOPULMONARY: No increased WOB. Speaking in clear sentences. I:E ratio WNL.  MS: Moves all visible extremities without noticeable abnormality. PSYCH: Pleasant and cooperative, well-groomed. Speech normal rate and rhythm. Affect is appropriate. Insight and judgement are appropriate. Attention is focused, linear, and appropriate.  NEURO: CN grossly intact. Oriented as arrived to appointment on time with no prompting. Moves both UE equally.  SKIN: No obvious lesions, wounds, erythema, or cyanosis noted on face or hands.  Depression screen [facility] 2/9 07/01/2018 03/01/2017  Decreased Interest 0 0  Down, Depressed, Hopeless 0 0  PHQ - 2 Score 0 0  Altered sleeping 0 -  Tired, decreased energy 0 -  Change in appetite 0 -  Feeling bad or failure about yourself  0 -  Trouble concentrating 0 -  Moving slowly or fidgety/restless 0 -  Suicidal  thoughts 0 -  PHQ-9 Score 0 -  Difficult doing work/chores Not difficult at all -    Assessment and Plan:   Telesha was seen today for follow-up.  Diagnoses and all orders for this visit:  Type 2 diabetes mellitus with other specified complication, without long-term current use of insulin ([place]) Comments: Wants to change to Metformin XR. Will call pharmacy to see if there is currently a recall. Orders: -     Hemoglobin A1c; Future  Hyperlipidemia associated with type 2 diabetes mellitus ([place]) -     Comprehensive metabolic panel; Future -     Lipid panel; Future  BMI 30.0-30.9,adult  Medication management Comments: Will get B12 level since she is on Metformin. Orders: -     CBC with Differential/Platelet; Future -     Vitamin B12; Future  Dyspepsia Comments: Improving with Omeprazole.     Marland Kitchen COVID-19 Education: The signs and symptoms of COVID-19 were discussed with the patient and how to seek care for testing if needed. The importance of social distancing was discussed today. . Reviewed expectations re: course of current medical issues. . Discussed self-management of symptoms. . Outlined signs and symptoms indicating need for more acute intervention. Marland Kitchen  Patient verbalized understanding and all questions were answered. Marland Kitchen Health Maintenance issues including appropriate healthy diet, exercise, and smoking avoidance were discussed with patient. . See orders for this visit as documented in the electronic medical record.  [name], DO  Records requested if needed. Time spent: 25 minutes, of which >50% was spent in obtaining information about her symptoms, reviewing her previous labs, evaluations, and treatments, counseling her about her condition (please see the discussed topics above), and developing a plan to further investigate it; she had a number of questions which I addressed.

## 2018-07-01 ENCOUNTER — Encounter: Payer: Self-pay | Admitting: Family Medicine

## 2018-07-01 ENCOUNTER — Other Ambulatory Visit: Payer: Self-pay

## 2018-07-01 ENCOUNTER — Ambulatory Visit (INDEPENDENT_AMBULATORY_CARE_PROVIDER_SITE_OTHER): Payer: BC Managed Care – PPO | Admitting: Family Medicine

## 2018-07-01 VITALS — Ht 65.0 in | Wt 175.0 lb

## 2018-07-01 DIAGNOSIS — Z79899 Other long term (current) drug therapy: Secondary | ICD-10-CM | POA: Diagnosis not present

## 2018-07-01 DIAGNOSIS — Z6830 Body mass index (BMI) 30.0-30.9, adult: Secondary | ICD-10-CM

## 2018-07-01 DIAGNOSIS — E1169 Type 2 diabetes mellitus with other specified complication: Secondary | ICD-10-CM | POA: Diagnosis not present

## 2018-07-01 DIAGNOSIS — R1013 Epigastric pain: Secondary | ICD-10-CM | POA: Diagnosis not present

## 2018-07-01 DIAGNOSIS — E785 Hyperlipidemia, unspecified: Secondary | ICD-10-CM

## 2018-07-08 ENCOUNTER — Other Ambulatory Visit (INDEPENDENT_AMBULATORY_CARE_PROVIDER_SITE_OTHER): Payer: BC Managed Care – PPO

## 2018-07-08 ENCOUNTER — Other Ambulatory Visit: Payer: Self-pay

## 2018-07-08 DIAGNOSIS — Z79899 Other long term (current) drug therapy: Secondary | ICD-10-CM | POA: Diagnosis not present

## 2018-07-08 DIAGNOSIS — E785 Hyperlipidemia, unspecified: Secondary | ICD-10-CM | POA: Diagnosis not present

## 2018-07-08 DIAGNOSIS — E1169 Type 2 diabetes mellitus with other specified complication: Secondary | ICD-10-CM

## 2018-07-08 LAB — LIPID PANEL
Cholesterol: 139 mg/dL (ref 0–200)
HDL: 63.3 mg/dL (ref >39.00)
LDL Cholesterol: 59 mg/dL (ref 0–99)
NonHDL: 76.11
Total CHOL/HDL Ratio: 2
Triglycerides: 85 mg/dL (ref 0.0–149.0)
VLDL: 17 mg/dL (ref 0.0–40.0)

## 2018-07-08 LAB — COMPREHENSIVE METABOLIC PANEL
ALT: 20 U/L (ref 0–35)
AST: 26 U/L (ref 0–37)
Albumin: 4.4 g/dL (ref 3.5–5.2)
Alkaline Phosphatase: 72 U/L (ref 39–117)
BUN: 14 mg/dL (ref 6–23)
CO2: 28 mEq/L (ref 19–32)
Calcium: 9.7 mg/dL (ref 8.4–10.5)
Chloride: 103 mEq/L (ref 96–112)
Creatinine, Ser: 0.76 mg/dL (ref 0.40–1.20)
GFR: 79.57 mL/min (ref >60.00)
Glucose, Bld: 101 mg/dL — ABNORMAL HIGH (ref 70–99)
Potassium: 3.7 mEq/L (ref 3.5–5.1)
Sodium: 140 mEq/L (ref 135–145)
Total Bilirubin: 0.5 mg/dL (ref 0.2–1.2)
Total Protein: 6.4 g/dL (ref 6.0–8.3)

## 2018-07-08 LAB — CBC WITH DIFFERENTIAL/PLATELET
Basophils Absolute: 0 10*3/uL (ref 0.0–0.1)
Basophils Relative: 0.6 % (ref 0.0–3.0)
Eosinophils Absolute: 0.1 10*3/uL (ref 0.0–0.7)
Eosinophils Relative: 1.8 % (ref 0.0–5.0)
HCT: 43.6 % (ref 36.0–46.0)
Hemoglobin: 14.5 g/dL (ref 12.0–15.0)
Lymphocytes Relative: 40.9 % (ref 12.0–46.0)
Lymphs Abs: 2.9 10*3/uL (ref 0.7–4.0)
MCHC: 33.4 g/dL (ref 30.0–36.0)
MCV: 86.9 fl (ref 78.0–100.0)
Monocytes Absolute: 0.5 10*3/uL (ref 0.1–1.0)
Monocytes Relative: 7.4 % (ref 3.0–12.0)
Neutro Abs: 3.5 10*3/uL (ref 1.4–7.7)
Neutrophils Relative %: 49.3 % (ref 43.0–77.0)
Platelets: 234 10*3/uL (ref 150.0–400.0)
RBC: 5.02 Mil/uL (ref 3.87–5.11)
RDW: 13.7 % (ref 11.5–15.5)
WBC: 7.1 10*3/uL (ref 4.0–10.5)

## 2018-07-08 LAB — HEMOGLOBIN A1C: Hgb A1c MFr Bld: 6.2 % (ref 4.6–6.5)

## 2018-07-08 LAB — VITAMIN B12: Vitamin B-12: 473 pg/mL (ref 211–911)

## 2018-07-19 ENCOUNTER — Other Ambulatory Visit: Payer: Self-pay

## 2018-07-19 MED ORDER — TRULICITY 0.75 MG/0.5ML ~~LOC~~ SOAJ: 1.5000 mg | SUBCUTANEOUS | 0 refills | Status: DC

## 2018-07-19 MED ORDER — ONDANSETRON 4 MG PO TBDP: 4.0000 mg | ORAL_TABLET | Freq: Three times a day (TID) | ORAL | 0 refills | Status: DC | PRN

## 2018-07-23 ENCOUNTER — Ambulatory Visit: Payer: BC Managed Care – PPO

## 2018-07-23 ENCOUNTER — Other Ambulatory Visit: Payer: Self-pay

## 2018-07-28 NOTE — Progress Notes (Signed)
Virtual Visit via Video   Due to the COVID-19 pandemic, this visit was completed with telemedicine (audio/video) technology to reduce patient and provider exposure as well as to preserve personal protective equipment.   I connected with Kendra Austin by a video enabled telemedicine application and verified that I am speaking with the correct person using two identifiers. Location patient: Home Location provider: [facility], Office Persons participating in the virtual visit: Kendra Austin, [name], DO . Lonell Grandchild, CMA acting as scribe for Dr. [name].   I discussed the limitations of evaluation and management by telemedicine and the availability of in person appointments. The patient expressed understanding and agreed to proceed.  Care Team   Patient Care Team: [name], DO as PCP - General (Family Medicine)  Subjective:   HPI: Patient started on Trulicity one week ago some nausea and decreased appetite. Most symptoms have improved other than the lack of appetite and constipation.   1. Type 2 diabetes mellitus with other specified complication, without long-term current use of insulin ([place])  Lab Results  Component Value Date   HGBA1C 6.2 07/08/2018   2. Hyperlipidemia associated with type 2 diabetes mellitus [facility])  Lab Results  Component Value Date   CHOL 139 07/08/2018   HDL 63.30 07/08/2018   LDLCALC 59 07/08/2018   TRIG 85.0 07/08/2018   CHOLHDL 2 07/08/2018   Lab Results  Component Value Date   ALT 20 07/08/2018   AST 26 07/08/2018   ALKPHOS 72 07/08/2018   BILITOT 0.5 07/08/2018   3. BMI 30.0-30.9,adult  Wt Readings from Last 3 Encounters:  07/29/18 170 lb (77.1 kg)  07/01/18 175 lb (79.4 kg)  06/18/18 177 lb (80.3 kg)   Review of Systems  Constitutional: Negative for chills and fever.  HENT: Negative for hearing loss and tinnitus.   Eyes: Negative for blurred vision and double vision.  Respiratory: Negative for cough.     Cardiovascular: Negative for chest pain, palpitations and leg swelling.  Gastrointestinal: Negative for nausea and vomiting.  Genitourinary: Negative for dysuria, frequency and urgency.  Musculoskeletal: Negative for myalgias and neck pain.  Neurological: Negative for dizziness and headaches.  Psychiatric/Behavioral: Negative for depression and suicidal ideas.    Patient Active Problem List   Diagnosis Date Noted  . Type 2 diabetes mellitus with other specified complication ([place]) 33/29/5188  . Dyspepsia 05/12/2018  . BMI 30.0-30.9,adult 11/23/2016  . Hyperlipidemia associated with type 2 diabetes mellitus ([place]) 03/27/2016  . Essential tremor 07/22/2015  . Seasonal allergies 07/22/2015  . DUB (dysfunctional uterine bleeding) 07/22/2015  . Posture imbalance 11/26/2014  . Nonallopathic lesion of cervical region 11/26/2014  . Nonallopathic lesion of thoracic region 11/26/2014  . Nonallopathic lesion of lumbosacral region 11/26/2014    Social History   Tobacco Use  . Smoking status: Never Smoker  . Smokeless tobacco: Never Used  Substance Use Topics  . Alcohol use: No    Alcohol/week: 0.0 standard drinks    Current Outpatient Medications:  .  Cholecalciferol (VITAMIN D PO), Take by mouth., Disp: , Rfl:  .  Dulaglutide (TRULICITY) 4.16 SA/6.3KZ SOPN, Inject 1.5 mg into the skin once a week., Disp: 12 pen, Rfl: 3 .  fluticasone (FLONASE) 50 MCG/ACT nasal spray, Place 2 sprays into both nostrils daily., Disp: , Rfl:  .  Lactobacillus Rhamnosus, GG, (CULTURELLE PO), Take by mouth daily., Disp: , Rfl:  .  LUTEIN PO, Take by mouth., Disp: , Rfl:  .  Multiple Vitamins-Minerals (MULTIVITAMIN  ADULT PO), Take by mouth., Disp: , Rfl:  .  Omega-3 Fatty Acids (FISH OIL PO), Take by mouth., Disp: , Rfl:  .  omeprazole (PRILOSEC) 40 MG capsule, TAKE 1 CAPSULE BY MOUTH EVERY DAY, Disp: 90 capsule, Rfl: 1 .  rosuvastatin (CRESTOR) 5 MG tablet, Take 1 tablet (5 mg total) by mouth daily., Disp: 90  tablet, Rfl: 3 .  TURMERIC PO, Take 1 capsule by mouth daily. , Disp: , Rfl:   Current Facility-Administered Medications:  .  0.9 %  sodium chloride infusion, 500 mL, Intravenous, Once, Danis, Kirke Corin, MD  Allergies  Allergen Reactions  . Benadryl [Diphenhydramine Hcl (Sleep)]     Dizzy, extremely drowsy, can cause a lot effects per pt!  . Other     Muscle relaxers-per patient she "cannot function"  . Aleve [Naproxen Sodium]     Heart palpitations - can take ibuprofen  . Flexeril [Cyclobenzaprine] Other (See Comments)    Makes her extremely tired  . Meloxicam     Made her depressed   Objective:   VITALS: Per patient if applicable, see vitals. GENERAL: Alert, appears well and in no acute distress. HEENT: Atraumatic, conjunctiva clear, no obvious abnormalities on inspection of external nose and ears. NECK: Normal movements of the head and neck. CARDIOPULMONARY: No increased WOB. Speaking in clear sentences. I:E ratio WNL.  MS: Moves all visible extremities without noticeable abnormality. PSYCH: Pleasant and cooperative, well-groomed. Speech normal rate and rhythm. Affect is appropriate. Insight and judgement are appropriate. Attention is focused, linear, and appropriate.  NEURO: CN grossly intact. Oriented as arrived to appointment on time with no prompting. Moves both UE equally.  SKIN: No obvious lesions, wounds, erythema, or cyanosis noted on face or hands.  Depression screen [facility] 2/9 07/01/2018 03/01/2017  Decreased Interest 0 0  Down, Depressed, Hopeless 0 0  PHQ - 2 Score 0 0  Altered sleeping 0 -  Tired, decreased energy 0 -  Change in appetite 0 -  Feeling bad or failure about yourself  0 -  Trouble concentrating 0 -  Moving slowly or fidgety/restless 0 -  Suicidal thoughts 0 -  PHQ-9 Score 0 -  Difficult doing work/chores Not difficult at all -   Assessment and Plan:   Kendra Austin was seen today for follow-up.  Diagnoses and all orders for this visit:  Type 2  diabetes mellitus with other specified complication, without long-term current use of insulin ([facility]) -     Dulaglutide (TRULICITY) 1.51 VO/1.6WV SOPN; Inject 1.5 mg into the skin once a week.  Hyperlipidemia associated with type 2 diabetes mellitus ([facility])  BMI 30.0-30.9,adult   . COVID-19 Education: The signs and symptoms of COVID-19 were discussed with the patient and how to seek care for testing if needed. The importance of social distancing was discussed today. . Reviewed expectations re: course of current medical issues. . Discussed self-management of symptoms. . Outlined signs and symptoms indicating need for more acute intervention. . Patient verbalized understanding and all questions were answered. Marland Kitchen Health Maintenance issues including appropriate healthy diet, exercise, and smoking avoidance were discussed with patient. . See orders for this visit as documented in the electronic medical record.  [name], DO  Records requested if needed. Time spent: 25 minutes, of which >50% was spent in obtaining information about her symptoms, reviewing her previous labs, evaluations, and treatments, counseling her about her condition (please see the discussed topics above), and developing a plan to further investigate it; she had a  number of questions which I addressed.

## 2018-07-29 ENCOUNTER — Encounter: Payer: Self-pay | Admitting: Family Medicine

## 2018-07-29 ENCOUNTER — Other Ambulatory Visit: Payer: Self-pay

## 2018-07-29 ENCOUNTER — Ambulatory Visit (INDEPENDENT_AMBULATORY_CARE_PROVIDER_SITE_OTHER): Payer: BC Managed Care – PPO | Admitting: Family Medicine

## 2018-07-29 VITALS — Ht 65.0 in | Wt 170.0 lb

## 2018-07-29 DIAGNOSIS — Z6830 Body mass index (BMI) 30.0-30.9, adult: Secondary | ICD-10-CM

## 2018-07-29 DIAGNOSIS — E1169 Type 2 diabetes mellitus with other specified complication: Secondary | ICD-10-CM | POA: Diagnosis not present

## 2018-07-29 DIAGNOSIS — E785 Hyperlipidemia, unspecified: Secondary | ICD-10-CM | POA: Diagnosis not present

## 2018-07-29 MED ORDER — TRULICITY 0.75 MG/0.5ML ~~LOC~~ SOAJ: 1.5000 mg | SUBCUTANEOUS | 3 refills | Status: DC

## 2018-07-29 NOTE — Patient Instructions (Signed)
GETTING TO GOOD BOWEL HEALTH  Irregular bowel habits such as constipation can lead to many problems over time.  Having one soft bowel movement a day is the most important way to prevent further problems.    The goal: ONE SOFT BOWEL MOVEMENT A DAY!  To have soft, regular bowel movements:  . Drink at least 8 tall glasses of water a day.   . Take plenty of fiber.  Fiber is the undigested part of plant food that passes into the colon, acting s "natures broom" to encourage bowel motility and movement.  Fiber can absorb and hold large amounts of water. This results in a larger, bulkier stool, which is soft and easier to pass. Work gradually over several weeks up to 6 servings a day of fiber (25g a day even more if needed) in the form of: o Vegetables -- Root (potatoes, carrots, turnips), leafy green (lettuce, salad greens, celery, spinach), or cooked high residue (cabbage, broccoli, etc) o Fruit -- Fresh (unpeeled skin & pulp), Dried (prunes, apricots, cherries, etc ),  or stewed ( applesauce)  o Whole grain breads, pasta, etc (whole wheat)  o Bran cereals  . Bulking Agents -- This type of water-retaining fiber generally is easily obtained each day by one of the following:  o Psyllium bran -- The psyllium plant is remarkable because its ground seeds can retain so much water. This product is available as Metamucil, Konsyl, Effersyllium, Per Diem Fiber, or the less expensive generic preparation in drug and health food stores. Although labeled a laxative, it really is not a laxative.  o Methylcellulose -- This is another fiber derived from wood which also retains water. It is available as Citrucel. o Polyethylene Glycol - and "artificial" fiber commonly called Miralax or Glycolax.  It is helpful for people with gassy or bloated feelings with regular fiber o Flax Seed - a less gassy fiber than psyllium . No reading or other relaxing activity while on the toilet. If bowel movements take longer than 5 minutes,  you are too constipated. . AVOID CONSTIPATION.  High fiber and water intake usually takes care of this.  Sometimes a laxative is needed to stimulate more frequent bowel movements, but  . Laxatives are not a good long-term solution as it can wear the colon out. o Osmotics (Milk of Magnesia, Fleets phosphosoda, Magnesium citrate, MiraLax, GoLytely) are safer than  o Stimulants (Senokot, Castor Oil, Dulcolax, Ex Lax)    o Do not take laxatives for more than 7days in a row. .  IF SEVERELY CONSTIPATED, try a Bowel Retraining Program: o Do not use laxatives.  o Eat a diet high in roughage, such as bran cereals and leafy vegetables.  o Drink six (6) ounces of prune or apricot juice each morning.  o Eat two (2) large servings of stewed fruit each day.  o Take one (1) heaping tablespoon of a psyllium-based bulking agent twice a day. Use sugar-free sweetener when possible to avoid excessive calories.  o Eat a normal breakfast.  o Set aside 15 minutes after breakfast to sit on the toilet, but do not strain to have a bowel movement.  o If you do not have a bowel movement by the third day, use an enema and repeat the above steps.   

## 2018-07-30 ENCOUNTER — Encounter: Payer: Self-pay | Admitting: Family Medicine

## 2018-08-07 ENCOUNTER — Encounter: Payer: Self-pay | Admitting: Family Medicine

## 2018-08-07 ENCOUNTER — Other Ambulatory Visit: Payer: Self-pay

## 2018-08-07 ENCOUNTER — Ambulatory Visit (INDEPENDENT_AMBULATORY_CARE_PROVIDER_SITE_OTHER): Payer: BC Managed Care – PPO | Admitting: Family Medicine

## 2018-08-07 VITALS — Ht 65.0 in | Wt 167.0 lb

## 2018-08-07 DIAGNOSIS — K5900 Constipation, unspecified: Secondary | ICD-10-CM | POA: Diagnosis not present

## 2018-08-07 NOTE — Patient Instructions (Signed)
Regimen for 1-2 week: Smooth Move tea daily, Psyllium husk (Metamucil or Benefiber) 1-2 times daily, Magnesium supplement 400 mg daily. Drink water.   GETTING TO GOOD BOWEL HEALTH  Irregular bowel habits such as constipation can lead to many problems over time.  Having one soft bowel movement a day is the most important way to prevent further problems.    The goal: ONE SOFT BOWEL MOVEMENT A DAY!  To have soft, regular bowel movements:  . Drink at least 8 tall glasses of water a day.   . Take plenty of fiber.  Fiber is the undigested part of plant food that passes into the colon, acting s "natures broom" to encourage bowel motility and movement.  Fiber can absorb and hold large amounts of water. This results in a larger, bulkier stool, which is soft and easier to pass. Work gradually over several weeks up to 6 servings a day of fiber (25g a day even more if needed) in the form of: o Vegetables -- Root (potatoes, carrots, turnips), leafy green (lettuce, salad greens, celery, spinach), or cooked high residue (cabbage, broccoli, etc) o Fruit -- Fresh (unpeeled skin & pulp), Dried (prunes, apricots, cherries, etc ),  or stewed ( applesauce)  o Whole grain breads, pasta, etc (whole wheat)  o Bran cereals  . Bulking Agents -- This type of water-retaining fiber generally is easily obtained each day by one of the following:  o Psyllium bran -- The psyllium plant is remarkable because its ground seeds can retain so much water. This product is available as Metamucil, Konsyl, Effersyllium, Per Diem Fiber, or the less expensive generic preparation in drug and health food stores. Although labeled a laxative, it really is not a laxative.  o Methylcellulose -- This is another fiber derived from wood which also retains water. It is available as Citrucel. o Polyethylene Glycol - and "artificial" fiber commonly called Miralax or Glycolax.  It is helpful for people with gassy or bloated feelings with regular  fiber o Flax Seed - a less gassy fiber than psyllium . No reading or other relaxing activity while on the toilet. If bowel movements take longer than 5 minutes, you are too constipated. . AVOID CONSTIPATION.  High fiber and water intake usually takes care of this.  Sometimes a laxative is needed to stimulate more frequent bowel movements, but  . Laxatives are not a good long-term solution as it can wear the colon out. o Osmotics (Milk of Magnesia, Fleets phosphosoda, Magnesium citrate, MiraLax, GoLytely) are safer than  o Stimulants (Senokot, Castor Oil, Dulcolax, Ex Lax)    o Do not take laxatives for more than 7days in a row. .  IF SEVERELY CONSTIPATED, try a Bowel Retraining Program: o Do not use laxatives.  o Eat a diet high in roughage, such as bran cereals and leafy vegetables.  o Drink six (6) ounces of prune or apricot juice each morning.  o Eat two (2) large servings of stewed fruit each day.  o Take one (1) heaping tablespoon of a psyllium-based bulking agent twice a day. Use sugar-free sweetener when possible to avoid excessive calories.  o Eat a normal breakfast.  o Set aside 15 minutes after breakfast to sit on the toilet, but do not strain to have a bowel movement.  o If you do not have a bowel movement by the third day, use an enema and repeat the above steps.

## 2018-08-07 NOTE — Progress Notes (Signed)
Virtual Visit via Video   Due to the COVID-19 pandemic, this visit was completed with telemedicine (audio/video) technology to reduce patient and provider exposure as well as to preserve personal protective equipment.   I connected with Kendra Austin by a video enabled telemedicine application and verified that I am speaking with the correct person using two identifiers. Location patient: Home Location provider: [facility], Office Persons participating in the virtual visit: Kendra Austin, [place], DO   I discussed the limitations of evaluation and management by telemedicine and the availability of in person appointments. The patient expressed understanding and agreed to proceed.  Care Team   Patient Care Team: [name], DO as PCP - General (Family Medicine)  Subjective:   HPI:   Patient has had increased constipation and abdominal pain after starting the Trulicity. She has had some increased loose stools that started on Monday. She is now having bowel movements that start as hard and then turn to loose every other day. She has been working on getting more fiber in her diet. Tried Smooth Move tea. Colonoscopy in 2018 normal. Hx of constipation. Had to take Zofran a few times in the past few weeks. Previous concern for IBS.  Review of Systems  Constitutional: Negative for chills and fever.  HENT: Negative for hearing loss and tinnitus.   Eyes: Negative for blurred vision and double vision.  Respiratory: Negative for cough and wheezing.   Cardiovascular: Negative for chest pain, palpitations and leg swelling.  Gastrointestinal: Positive for constipation and diarrhea. Negative for nausea and vomiting.  Genitourinary: Negative for dysuria, frequency and urgency.  Neurological: Negative for dizziness and headaches.  Psychiatric/Behavioral: Negative for depression and suicidal ideas.    Patient Active Problem List   Diagnosis Date Noted  . Type 2 diabetes mellitus with  other specified complication ([place]) 78/29/5621  . Dyspepsia 05/12/2018  . BMI 30.0-30.9,adult 11/23/2016  . Hyperlipidemia associated with type 2 diabetes mellitus ([place]) 03/27/2016  . Essential tremor 07/22/2015  . Seasonal allergies 07/22/2015  . DUB (dysfunctional uterine bleeding) 07/22/2015  . Posture imbalance 11/26/2014  . Nonallopathic lesion of cervical region 11/26/2014  . Nonallopathic lesion of thoracic region 11/26/2014  . Nonallopathic lesion of lumbosacral region 11/26/2014    Social History   Tobacco Use  . Smoking status: Never Smoker  . Smokeless tobacco: Never Used  Substance Use Topics  . Alcohol use: No    Alcohol/week: 0.0 standard drinks    Current Outpatient Medications:  .  Cholecalciferol (VITAMIN D PO), Take by mouth., Disp: , Rfl:  .  Dulaglutide (TRULICITY) 3.08 MV/7.8IO SOPN, Inject 1.5 mg into the skin once a week., Disp: 12 pen, Rfl: 3 .  fluticasone (FLONASE) 50 MCG/ACT nasal spray, Place 2 sprays into both nostrils daily., Disp: , Rfl:  .  Lactobacillus Rhamnosus, GG, (CULTURELLE PO), Take by mouth daily., Disp: , Rfl:  .  LUTEIN PO, Take by mouth., Disp: , Rfl:  .  Multiple Vitamins-Minerals (MULTIVITAMIN ADULT PO), Take by mouth., Disp: , Rfl:  .  Omega-3 Fatty Acids (FISH OIL PO), Take by mouth., Disp: , Rfl:  .  omeprazole (PRILOSEC) 40 MG capsule, TAKE 1 CAPSULE BY MOUTH EVERY DAY, Disp: 90 capsule, Rfl: 1 .  rosuvastatin (CRESTOR) 5 MG tablet, Take 1 tablet (5 mg total) by mouth daily., Disp: 90 tablet, Rfl: 3 .  TURMERIC PO, Take 1 capsule by mouth daily. , Disp: , Rfl:   Current Facility-Administered Medications:  .  0.9 %  sodium chloride infusion, 500 mL, Intravenous, Once, Danis, Kirke Corin, MD  Allergies  Allergen Reactions  . Benadryl [Diphenhydramine Hcl (Sleep)]     Dizzy, extremely drowsy, can cause a lot effects per pt!  . Other     Muscle relaxers-per patient she "cannot function"  . Aleve [Naproxen Sodium]     Heart  palpitations - can take ibuprofen  . Flexeril [Cyclobenzaprine] Other (See Comments)    Makes her extremely tired  . Meloxicam     Made her depressed    Objective:   VITALS: Per patient if applicable, see vitals. GENERAL: Alert, appears well and in no acute distress. HEENT: Atraumatic, conjunctiva clear, no obvious abnormalities on inspection of external nose and ears. NECK: Normal movements of the head and neck. CARDIOPULMONARY: No increased WOB. Speaking in clear sentences. I:E ratio WNL.  MS: Moves all visible extremities without noticeable abnormality. PSYCH: Pleasant and cooperative, well-groomed. Speech normal rate and rhythm. Affect is appropriate. Insight and judgement are appropriate. Attention is focused, linear, and appropriate.  NEURO: CN grossly intact. Oriented as arrived to appointment on time with no prompting. Moves both UE equally.  SKIN: No obvious lesions, wounds, erythema, or cyanosis noted on face or hands.  Depression screen [facility] 2/9 07/01/2018 03/01/2017  Decreased Interest 0 0  Down, Depressed, Hopeless 0 0  PHQ - 2 Score 0 0  Altered sleeping 0 -  Tired, decreased energy 0 -  Change in appetite 0 -  Feeling bad or failure about yourself  0 -  Trouble concentrating 0 -  Moving slowly or fidgety/restless 0 -  Suicidal thoughts 0 -  PHQ-9 Score 0 -  Difficult doing work/chores Not difficult at all -   Assessment and Plan:   Amayia was seen today for constipation.  Diagnoses and all orders for this visit:  Constipation Comments: SEE AVS.   . COVID-19 Education: The signs and symptoms of COVID-19 were discussed with the patient and how to seek care for testing if needed. The importance of social distancing was discussed today. . Reviewed expectations re: course of current medical issues. . Discussed self-management of symptoms. . Outlined signs and symptoms indicating need for more acute intervention. . Patient verbalized understanding and all questions  were answered. Marland Kitchen Health Maintenance issues including appropriate healthy diet, exercise, and smoking avoidance were discussed with patient. . See orders for this visit as documented in the electronic medical record.  [name], DO  Records requested if needed. Time spent: 15 minutes, of which >50% was spent in obtaining information about her symptoms, reviewing her previous labs, evaluations, and treatments, counseling her about her condition (please see the discussed topics above), and developing a plan to further investigate it; she had a number of questions which I addressed.

## 2018-08-20 ENCOUNTER — Other Ambulatory Visit: Payer: Self-pay | Admitting: Family Medicine

## 2018-08-20 DIAGNOSIS — E1169 Type 2 diabetes mellitus with other specified complication: Secondary | ICD-10-CM

## 2018-08-24 NOTE — Progress Notes (Signed)
[name] [address] [place], [place] [address] Phone: [phone] Subjective:   [name] am serving as a Education administrator for Dr. Hulan Saas.   CC: Neck and back pain follow-up  [id number]     08/26/2018 Kendra Austin is a 53 y.o. female coming in with complaint of right shoulder pain. Patient states she is doing well today.  Patient has had some tightness along the back and shoulder previously.  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  Patient denies any numbness or tingling.    Past Medical History:  Diagnosis Date  . Allergy   . Arthritis    osteoarthritis; neck, R shoulder  . Atypical chest pain    has had prior cardiac eval in the past/ started 4 years ago/ corrected by back adjustmment  . Complication of anesthesia    Per pt, sensitive to sedation!  . Depression    related to stress from prior job; no medications, hospitalizations or SI  . DUB (dysfunctional uterine bleeding) 07/22/2015  . Essential tremor 07/22/2015   left hand  . GERD (gastroesophageal reflux disease)   . Hemorrhoids   . Hyperglycemia    Pre-diabetic per pt!  . Hyperlipidemia   . Seasonal allergies 07/22/2015  . Vertigo    resolves with chiropratic; and sees Dr. Tamala Julian   Past Surgical History:  Procedure Laterality Date  . COLONOSCOPY    . GANGLION CYST EXCISION     right wrist  . HYSTEROSCOPY    . TONSILLECTOMY AND ADENOIDECTOMY  1971/1972  . UPPER GASTROINTESTINAL ENDOSCOPY     Social History   Socioeconomic History  . Marital status: Single    Spouse name: Not on file  . Number of children: Not on file  . Years of education: Not on file  . Highest education level: Not on file  Occupational History  . Not on file  Social Needs  . Financial resource strain: Not on file  . Food insecurity    Worry: Not on file    Inability: Not on file  . Transportation  needs    Medical: Not on file    Non-medical: Not on file  Tobacco Use  . Smoking status: Never Smoker  . Smokeless tobacco: Never Used  Substance and Sexual Activity  . Alcohol use: No    Alcohol/week: 0.0 standard drinks  . Drug use: No  . Sexual activity: Not on file  Lifestyle  . Physical activity    Days per week: Not on file    Minutes per session: Not on file  . Stress: Not on file  Relationships  . Social Herbalist on phone: Not on file    Gets together: Not on file    Attends religious service: Not on file    Active member of club or organization: Not on file    Attends meetings of clubs or organizations: Not on file    Relationship status: Not on file  Other Topics Concern  . Not on file  Social History Narrative   Work or School: Hydrologist; teaches spanish and pedagogy       Home Situation:       Spiritual Beliefs:       Lifestyle: no regular exercise; diet is pretty good - avoids gluten         Allergies  Allergen Reactions  . Benadryl [Diphenhydramine  Hcl (Sleep)]     Dizzy, extremely drowsy, can cause a lot effects per pt!  . Other     Muscle relaxers-per patient she "cannot function"  . Aleve [Naproxen Sodium]     Heart palpitations - can take ibuprofen  . Flexeril [Cyclobenzaprine] Other (See Comments)    Makes her extremely tired  . Meloxicam     Made her depressed   Family History  Problem Relation Age of Onset  . Diabetes Mother   . Macular degeneration Mother   . Colon polyps Mother   . Heart disease Father   . Diabetes Father   . Macular degeneration Maternal Grandmother   . Colon cancer Neg Hx   . Esophageal cancer Neg Hx   . Stomach cancer Neg Hx   . Rectal cancer Neg Hx     Current Outpatient Medications (Endocrine & Metabolic):  Marland Kitchen  TRULICITY 7.51 WC/5.8NI SOPN, INJECT 0.40M INTO THE SKIN WEEKLY   Current Outpatient Medications (Cardiovascular):  .  rosuvastatin (CRESTOR) 5 MG tablet, Take 1 tablet (5 mg total) by  mouth daily.   Current Outpatient Medications (Respiratory):  .  fluticasone (FLONASE) 50 MCG/ACT nasal spray, Place 2 sprays into both nostrils daily.       Current Outpatient Medications (Other):  Marland Kitchen  Cholecalciferol (VITAMIN D PO), Take by mouth. .  Lactobacillus Rhamnosus, GG, (CULTURELLE PO), Take by mouth daily. .  LUTEIN PO, Take by mouth. .  Multiple Vitamins-Minerals (MULTIVITAMIN ADULT PO), Take by mouth. .  Omega-3 Fatty Acids (FISH OIL PO), Take by mouth. Marland Kitchen  omeprazole (PRILOSEC) 40 MG capsule, TAKE 1 CAPSULE BY MOUTH EVERY DAY .  TURMERIC PO, Take 1 capsule by mouth daily.   Current Facility-Administered Medications (Other):  .  0.9 %  sodium chloride infusion    Past medical history, social, surgical and family history all reviewed in electronic medical record.  No pertanent information unless stated regarding to the chief complaint.   Review of Systems:  No headache, visual changes, nausea, vomiting, diarrhea, constipation, dizziness, abdominal pain, skin rash, fevers, chills, night sweats, weight loss, swollen lymph nodes, body aches, joint swelling, muscle aches, chest pain, shortness of breath, mood changes.   Objective  There were no vitals taken for this visit. Systems examined below as of    General: No apparent distress alert and oriented x3 mood and affect normal, dressed appropriately.  HEENT: Pupils equal, extraocular movements intact  Respiratory: Patient's speak in full sentences and does not appear short of breath  Cardiovascular: No lower extremity edema, non tender, no erythema  Skin: Warm dry intact with no signs of infection or rash on extremities or on axial skeleton.  Abdomen: Soft nontender  Neuro: Cranial nerves II through XII are intact, neurovascularly intact in all extremities with 2+ DTRs and 2+ pulses.  Lymph: No lymphadenopathy of posterior or anterior cervical chain or axillae bilaterally.  Gait normal with good balance and  coordination.  MSK:  Non tender with full range of motion and good stability and symmetric strength and tone of shoulders, elbows, wrist, hip, knee and ankles bilaterally.  Back Exam:  Inspection: Loss of lordosis Motion: Flexion 40 deg, Extension 25 deg, Side Bending to 35 deg bilaterally,  Rotation to 35 deg bilaterally  SLR laying: Negative  XSLR laying: Negative  Palpable tenderness: Tender to palpation in the thoracolumbar area.Marland Kitchen FABER: negative. Sensory change: Gross sensation intact to all lumbar and sacral dermatomes.  Reflexes: 2+ at both patellar tendons, 2+ at achilles  tendons, Babinski's downgoing.  Strength at foot  Plantar-flexion: 5/5 Dorsi-flexion: 5/5 Eversion: 5/5 Inversion: 5/5  Leg strength  Quad: 5/5 Hamstring: 5/5 Hip flexor: 5/5 Hip abductors: 5/5  Gait unremarkable.  Osteopathic findings  C6 flexed rotated and side bent left T3 extended rotated and side bent right inhaled third rib T9 extended rotated and side bent left L2 flexed rotated and side bent right Sacrum right on right    Impression and Recommendations:     This case required medical decision making of moderate complexity. The above documentation has been reviewed and is accurate and complete Lyndal Pulley, DO       Note: This dictation was prepared with Dragon dictation along with smaller phrase technology. Any transcriptional errors that result from this process are unintentional.

## 2018-08-26 ENCOUNTER — Other Ambulatory Visit: Payer: Self-pay

## 2018-08-26 ENCOUNTER — Encounter: Payer: Self-pay | Admitting: Family Medicine

## 2018-08-26 ENCOUNTER — Ambulatory Visit (INDEPENDENT_AMBULATORY_CARE_PROVIDER_SITE_OTHER): Payer: BC Managed Care – PPO | Admitting: Family Medicine

## 2018-08-26 DIAGNOSIS — M999 Biomechanical lesion, unspecified: Secondary | ICD-10-CM | POA: Diagnosis not present

## 2018-08-26 DIAGNOSIS — R293 Abnormal posture: Secondary | ICD-10-CM

## 2018-08-26 NOTE — Patient Instructions (Signed)
Good to see you Good luck with the semester See me again in 3 months

## 2018-08-26 NOTE — Assessment & Plan Note (Signed)
Decision today to treat with OMT was based on Physical Exam  After verbal consent patient was treated with HVLA, ME, FPR techniques in cervical, thoracic, lumbar and sacral areas  Patient tolerated the procedure well with improvement in symptoms  Patient given exercises, stretches and lifestyle modifications  See medications in patient instructions if given  Patient will follow up in 4-8 weeks 

## 2018-08-26 NOTE — Assessment & Plan Note (Signed)
Patient still has some postural imbalances.  He is making progress tone.  We discussed icing regimen and home exercises, which activities to do which wants to avoid.  Patient will increase activity slowly over the course of next several weeks.  Follow-up with me again in 4 to 8 weeks

## 2018-09-10 ENCOUNTER — Other Ambulatory Visit: Payer: Self-pay | Admitting: Family Medicine

## 2018-09-10 DIAGNOSIS — E1169 Type 2 diabetes mellitus with other specified complication: Secondary | ICD-10-CM

## 2018-09-10 NOTE — Telephone Encounter (Signed)
Last fill 08/20/18  #4/0 Last OV 07/29/18

## 2018-10-03 ENCOUNTER — Other Ambulatory Visit: Payer: Self-pay | Admitting: Family Medicine

## 2018-10-03 DIAGNOSIS — E1169 Type 2 diabetes mellitus with other specified complication: Secondary | ICD-10-CM

## 2018-10-04 NOTE — Telephone Encounter (Signed)
Last fill 09/10/18  #4/0 Last OV 08/07/18

## 2018-10-24 ENCOUNTER — Ambulatory Visit (INDEPENDENT_AMBULATORY_CARE_PROVIDER_SITE_OTHER): Payer: BC Managed Care – PPO | Admitting: Psychology

## 2018-10-24 DIAGNOSIS — F4322 Adjustment disorder with anxiety: Secondary | ICD-10-CM

## 2018-10-25 ENCOUNTER — Other Ambulatory Visit: Payer: Self-pay | Admitting: Family Medicine

## 2018-10-25 DIAGNOSIS — E1169 Type 2 diabetes mellitus with other specified complication: Secondary | ICD-10-CM

## 2018-10-29 ENCOUNTER — Other Ambulatory Visit: Payer: Self-pay

## 2018-10-29 ENCOUNTER — Ambulatory Visit (INDEPENDENT_AMBULATORY_CARE_PROVIDER_SITE_OTHER): Payer: BC Managed Care – PPO | Admitting: Family Medicine

## 2018-10-29 ENCOUNTER — Encounter: Payer: Self-pay | Admitting: Family Medicine

## 2018-10-29 VITALS — Ht 65.0 in | Wt 163.0 lb

## 2018-10-29 DIAGNOSIS — Z20822 Contact with and (suspected) exposure to covid-19: Secondary | ICD-10-CM

## 2018-10-29 DIAGNOSIS — Z20828 Contact with and (suspected) exposure to other viral communicable diseases: Secondary | ICD-10-CM | POA: Diagnosis not present

## 2018-10-29 DIAGNOSIS — E1169 Type 2 diabetes mellitus with other specified complication: Secondary | ICD-10-CM | POA: Diagnosis not present

## 2018-10-29 DIAGNOSIS — R1013 Epigastric pain: Secondary | ICD-10-CM | POA: Diagnosis not present

## 2018-10-29 NOTE — Progress Notes (Signed)
Virtual Visit via Video   Due to the COVID-19 pandemic, this visit was completed with telemedicine (audio/video) technology to reduce patient and provider exposure as well as to preserve personal protective equipment.   I connected with [name] by a video enabled telemedicine application and verified that I am speaking with the correct person using two identifiers. Location patient: Home Location provider: [facility], Office Persons participating in the virtual visit: Riannon, Degarmo, DO   I discussed the limitations of evaluation and management by telemedicine and the availability of in person appointments. The patient expressed understanding and agreed to proceed.  Care Team   Patient Care Team: [name], DO as PCP - General (Family Medicine)  Subjective:   HPI: Patient is doing well.  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  She will still need to talk to her supervisor about this. Asks for COVID testing so that she can see Orthopedics for her left shoulder. Still liking the Trulicity. Uses Smooth Move Tea once weekly. Dyspepsia improving, hoping to wean Omeprazole.   Review of Systems  Constitutional: Negative for chills and fever.  HENT: Positive for sore throat. Negative for hearing loss and tinnitus.   Eyes: Negative.   Respiratory: Negative.   Cardiovascular: Negative.   Gastrointestinal: Negative.   Genitourinary: Negative.   Musculoskeletal: Negative.   Neurological: Negative.   Endo/Heme/Allergies: Negative.   Psychiatric/Behavioral: Negative.     Patient Active Problem List   Diagnosis Date Noted  . Type 2 diabetes mellitus with other specified complication ([place]) [id number]  . Dyspepsia 05/12/2018  . BMI 30.0-30.9,adult 11/23/2016  . Hyperlipidemia associated with type 2 diabetes mellitus ([place]) 03/27/2016  . Essential tremor 07/22/2015  .  Seasonal allergies 07/22/2015  . DUB (dysfunctional uterine bleeding) 07/22/2015  . Posture imbalance 11/26/2014  . Nonallopathic lesion of cervical region 11/26/2014  . Nonallopathic lesion of thoracic region 11/26/2014  . Nonallopathic lesion of lumbosacral region 11/26/2014    Social History   Tobacco Use  . Smoking status: Never Smoker  . Smokeless tobacco: Never Used  Substance Use Topics  . Alcohol use: No    Alcohol/week: 0.0 standard drinks    Current Outpatient Medications:  .  Cholecalciferol (VITAMIN D PO), Take by mouth., Disp: , Rfl:  .  fluticasone (FLONASE) 50 MCG/ACT nasal spray, Place 2 sprays into both nostrils daily., Disp: , Rfl:  .  Lactobacillus Rhamnosus, GG, (CULTURELLE PO), Take by mouth daily., Disp: , Rfl:  .  LUTEIN PO, Take by mouth., Disp: , Rfl:  .  Multiple Vitamins-Minerals (MULTIVITAMIN ADULT PO), Take by mouth., Disp: , Rfl:  .  Omega-3 Fatty Acids (FISH OIL PO), Take by mouth., Disp: , Rfl:  .  omeprazole (PRILOSEC) 40 MG capsule, TAKE 1 CAPSULE BY MOUTH EVERY DAY, Disp: 90 capsule, Rfl: 1 .  rosuvastatin (CRESTOR) 5 MG tablet, Take 1 tablet (5 mg total) by mouth daily., Disp: 90 tablet, Rfl: 3 .  TRULICITY [id number] [id number] SOPN, INJECT 0.75MG  INTO THE SKIN WEEKLY, Disp: 4 pen, Rfl: 0 .  TURMERIC PO, Take 1 capsule by mouth daily. , Disp: , Rfl:   Current Facility-Administered Medications:  .  0.9 %  sodium chloride infusion, 500 mL, Intravenous, Once, Danis, Kirke Corin, MD  Allergies  Allergen Reactions  . Benadryl [Diphenhydramine Hcl (Sleep)]     Dizzy, extremely drowsy, can cause a  lot effects per pt!  . Other     Muscle relaxers-per patient she "cannot function"  . Aleve [Naproxen Sodium]     Heart palpitations - can take ibuprofen  . Flexeril [Cyclobenzaprine] Other (See Comments)    Makes her extremely tired  . Meloxicam     Made her depressed    Objective:   VITALS: Per patient if applicable, see vitals. GENERAL: Alert,  appears well and in no acute distress. HEENT: Atraumatic, conjunctiva clear, no obvious abnormalities on inspection of external nose and ears. NECK: Normal movements of the head and neck. CARDIOPULMONARY: No increased WOB. Speaking in clear sentences. I:E ratio WNL.  MS: Moves all visible extremities without noticeable abnormality. PSYCH: Pleasant and cooperative, well-groomed. Speech normal rate and rhythm. Affect is appropriate. Insight and judgement are appropriate. Attention is focused, linear, and appropriate.  NEURO: CN grossly intact. Oriented as arrived to appointment on time with no prompting. Moves both UE equally.  SKIN: No obvious lesions, wounds, erythema, or cyanosis noted on face or hands.  Depression screen [facility] 2/9 07/01/2018 03/01/2017  Decreased Interest 0 0  Down, Depressed, Hopeless 0 0  PHQ - 2 Score 0 0  Altered sleeping 0 -  Tired, decreased energy 0 -  Change in appetite 0 -  Feeling bad or failure about yourself  0 -  Trouble concentrating 0 -  Moving slowly or fidgety/restless 0 -  Suicidal thoughts 0 -  PHQ-9 Score 0 -  Difficult doing work/chores Not difficult at all -    Assessment and Plan:   Johnette was seen today for follow-up.  Diagnoses and all orders for this visit:  Exposure to COVID-19 virus -     Novel Coronavirus, NAA ([facility])  Type 2 diabetes mellitus with other specified complication, without long-term current use of insulin ([place])  Dyspepsia Comments: Improved. Decrease to OTC Prilosec 20 mg daily. Then, okay to stop and use Pepcid prn.    Marland Kitchen COVID-19 Education: The signs and symptoms of COVID-19 were discussed with the patient and how to seek care for testing if needed. The importance of social distancing was discussed today. . Reviewed expectations re: course of current medical issues. . Discussed self-management of symptoms. . Outlined signs and symptoms indicating need for more acute intervention. . Patient verbalized understanding  and all questions were answered. Marland Kitchen Health Maintenance issues including appropriate healthy diet, exercise, and smoking avoidance were discussed with patient. . See orders for this visit as documented in the electronic medical record.  [name], DO  Records requested if needed. Time spent: 25 minutes, of which >50% was spent in obtaining information about her symptoms, reviewing her previous labs, evaluations, and treatments, counseling her about her condition (please see the discussed topics above), and developing a plan to further investigate it; she had a number of questions which I addressed.

## 2018-10-29 NOTE — Telephone Encounter (Signed)
Last fill 10/04/18 Last OV 10/29/18

## 2018-10-31 ENCOUNTER — Ambulatory Visit: Payer: BC Managed Care – PPO | Admitting: Psychology

## 2018-11-01 LAB — NOVEL CORONAVIRUS, NAA: SARS-CoV-2, NAA: NOT DETECTED

## 2018-11-06 ENCOUNTER — Other Ambulatory Visit: Payer: Self-pay

## 2018-11-06 DIAGNOSIS — E1169 Type 2 diabetes mellitus with other specified complication: Secondary | ICD-10-CM

## 2018-11-06 NOTE — Progress Notes (Signed)
[facility] [address] [place], [place] [address] Phone: [phone] Subjective:   I Kendra Austin am serving as a Education administrator for Dr. Hulan Saas.  I'm seeing this patient by the request  of:    CC: Left shoulder and neck pain follow-up [id number]   08/26/2018 Patient still has some postural imbalances.  He is making progress tone.  We discussed icing regimen and home exercises, which activities to do which wants to avoid.  Patient will increase activity slowly over the course of next several weeks.  Follow-up with me again in 4 to 8 weeks  11/07/2018 Kendra Austin is a 53 y.o. female coming in with complaint of posture imbalance. Left arm pain. Pain radiates up and down the arm. Started swimming and experienced pain.   Onset- wrist 4 months shoulder 1 month  Location - radial side, posterior shoulder  Duration-  Character- Aggravating factors- putting on the seatbelt belt hurts the wrist and shoulder  Reliving factors-  Therapies tried- tiger balm  Severity-  7/10     Past Medical History:  Diagnosis Date  . Allergy   . Arthritis    osteoarthritis; neck, R shoulder  . Atypical chest pain    has had prior cardiac eval in the past/ started 4 years ago/ corrected by back adjustmment  . Complication of anesthesia    Per pt, sensitive to sedation!  . Depression    related to stress from prior job; no medications, hospitalizations or SI  . DUB (dysfunctional uterine bleeding) 07/22/2015  . Essential tremor 07/22/2015   left hand  . GERD (gastroesophageal reflux disease)   . Hemorrhoids   . Hyperglycemia    Pre-diabetic per pt!  . Hyperlipidemia   . Seasonal allergies 07/22/2015  . Vertigo    resolves with chiropratic; and sees Dr. Tamala Julian   Past Surgical History:  Procedure Laterality Date  . COLONOSCOPY    . GANGLION CYST EXCISION     right wrist  . HYSTEROSCOPY    . TONSILLECTOMY AND ADENOIDECTOMY  1971/1972  . UPPER GASTROINTESTINAL  ENDOSCOPY     Social History   Socioeconomic History  . Marital status: Single    Spouse name: Not on file  . Number of children: Not on file  . Years of education: Not on file  . Highest education level: Not on file  Occupational History  . Occupation: Patent attorney: [employer]  . Financial resource strain: Not on file  . Food insecurity    Worry: Not on file    Inability: Not on file  . Transportation needs    Medical: Not on file    Non-medical: Not on file  Tobacco Use  . Smoking status: Never Smoker  . Smokeless tobacco: Never Used  Substance and Sexual Activity  . Alcohol use: No    Alcohol/week: 0.0 standard drinks  . Drug use: No  . Sexual activity: Not on file  Lifestyle  . Physical activity    Days per week: Not on file    Minutes per session: Not on file  . Stress: Not on file  Relationships  . Social Herbalist on phone: Not on file    Gets together: Not on file    Attends religious service: Not on file    Active member of club or organization: Not on file    Attends meetings of clubs or organizations: Not on file  Relationship status: Not on file  Other Topics Concern  . Not on file  Social History Narrative   Work or School: Hydrologist; teaches spanish and pedagogy       Home Situation:       Spiritual Beliefs:       Lifestyle: no regular exercise; diet is pretty good - avoids gluten         Allergies  Allergen Reactions  . Benadryl [Diphenhydramine Hcl (Sleep)]     Dizzy, extremely drowsy, can cause a lot effects per pt!  . Other     Muscle relaxers-per patient she "cannot function"  . Aleve [Naproxen Sodium]     Heart palpitations - can take ibuprofen  . Flexeril [Cyclobenzaprine] Other (See Comments)    Makes her extremely tired  . Meloxicam     Made her depressed   Family History  Problem Relation Age of Onset  . Diabetes Mother   . Macular degeneration Mother   . Colon polyps Mother   .  Heart disease Father   . Diabetes Father   . Macular degeneration Maternal Grandmother   . Colon cancer Neg Hx   . Esophageal cancer Neg Hx   . Stomach cancer Neg Hx   . Rectal cancer Neg Hx     Current Outpatient Medications (Endocrine & Metabolic):  Marland Kitchen  TRULICITY [id number] [id number] SOPN, INJECT 0.75MG  INTO THE SKIN WEEKLY   Current Outpatient Medications (Cardiovascular):  .  rosuvastatin (CRESTOR) 5 MG tablet, Take 1 tablet (5 mg total) by mouth daily.   Current Outpatient Medications (Respiratory):  .  fluticasone (FLONASE) 50 MCG/ACT nasal spray, Place 2 sprays into both nostrils daily.       Current Outpatient Medications (Other):  Marland Kitchen  Cholecalciferol (VITAMIN D PO), Take by mouth. .  Lactobacillus Rhamnosus, GG, (CULTURELLE PO), Take by mouth daily. .  LUTEIN PO, Take by mouth. .  Multiple Vitamins-Minerals (MULTIVITAMIN ADULT PO), Take by mouth. .  Omega-3 Fatty Acids (FISH OIL PO), Take by mouth. Marland Kitchen  omeprazole (PRILOSEC) 40 MG capsule, TAKE 1 CAPSULE BY MOUTH EVERY DAY .  TURMERIC PO, Take 1 capsule by mouth daily.   Current Facility-Administered Medications (Other):  .  0.9 %  sodium chloride infusion    Past medical history, social, surgical and family history all reviewed in electronic medical record.  No pertanent information unless stated regarding to the chief complaint.   Review of Systems:  No headache, visual changes, nausea, vomiting, diarrhea, constipation, dizziness, abdominal pain, skin rash, fevers, chills, night sweats, weight loss, swollen lymph nodes, body aches, joint swelling, muscle aches, chest pain, shortness of breath, mood changes.   Objective  There were no vitals taken for this visit. Systems examined below as of    General: No apparent distress alert and oriented x3 mood and affect normal, dressed appropriately.  HEENT: Pupils equal, extraocular movements intact  Respiratory: Patient's speak in full sentences and does not appear short of  breath  Cardiovascular: No lower extremity edema, non tender, no erythema  Skin: Warm dry intact with no signs of infection or rash on extremities or on axial skeleton.  Abdomen: Soft nontender  Neuro: Cranial nerves II through XII are intact, neurovascularly intact in all extremities with 2+ DTRs and 2+ pulses.  Lymph: No lymphadenopathy of posterior or anterior cervical chain or axillae bilaterally.  Gait normal with good balance and coordination.  MSK:  Non tender with full range of motion and good stability and symmetric strength and tone  of shoulders, elbows, wrist, hip, knee and ankles bilaterally.  Shoulder: left Inspection reveals no abnormalities, atrophy or asymmetry. Palpation is normal with no tenderness over AC joint or bicipital groove. ROM decreased range of motion in all planes lacking last 15 degrees of external rotation. Rotator cuff strength normal throughout. signs of impingement with positive Neer and Hawkin's tests, but negative empty can sign. Speeds and Yergason's tests normal. labral pathology noted with minorly positive Obrien's, negative clunk and good stability. Normal scapular function observed. No painful arc and no drop arm sign. No apprehension sign  Osteopathic findings   C6 flexed rotated and side bent left T9 extended rotated and side bent left L3 flexed rotated and side bent right     Impression and Recommendations:     This case required medical decision making of moderate complexity. The above documentation has been reviewed and is accurate and complete Lyndal Pulley, DO       Note: This dictation was prepared with Dragon dictation along with smaller phrase technology. Any transcriptional errors that result from this process are unintentional.

## 2018-11-07 ENCOUNTER — Other Ambulatory Visit: Payer: Self-pay

## 2018-11-07 ENCOUNTER — Other Ambulatory Visit (INDEPENDENT_AMBULATORY_CARE_PROVIDER_SITE_OTHER): Payer: BC Managed Care – PPO

## 2018-11-07 ENCOUNTER — Ambulatory Visit (INDEPENDENT_AMBULATORY_CARE_PROVIDER_SITE_OTHER): Payer: BC Managed Care – PPO | Admitting: Family Medicine

## 2018-11-07 ENCOUNTER — Ambulatory Visit (INDEPENDENT_AMBULATORY_CARE_PROVIDER_SITE_OTHER): Payer: BC Managed Care – PPO | Admitting: Psychology

## 2018-11-07 ENCOUNTER — Encounter: Payer: Self-pay | Admitting: Family Medicine

## 2018-11-07 ENCOUNTER — Ambulatory Visit (INDEPENDENT_AMBULATORY_CARE_PROVIDER_SITE_OTHER): Payer: BC Managed Care – PPO

## 2018-11-07 VITALS — BP 120/80 | HR 80 | Ht 65.0 in | Wt 168.4 lb

## 2018-11-07 DIAGNOSIS — F4322 Adjustment disorder with anxiety: Secondary | ICD-10-CM

## 2018-11-07 DIAGNOSIS — Z23 Encounter for immunization: Secondary | ICD-10-CM

## 2018-11-07 DIAGNOSIS — E1169 Type 2 diabetes mellitus with other specified complication: Secondary | ICD-10-CM | POA: Diagnosis not present

## 2018-11-07 DIAGNOSIS — M7500 Adhesive capsulitis of unspecified shoulder: Secondary | ICD-10-CM

## 2018-11-07 DIAGNOSIS — M999 Biomechanical lesion, unspecified: Secondary | ICD-10-CM

## 2018-11-07 DIAGNOSIS — M7502 Adhesive capsulitis of left shoulder: Secondary | ICD-10-CM

## 2018-11-07 HISTORY — DX: Adhesive capsulitis of unspecified shoulder: M75.00

## 2018-11-07 LAB — CBC WITH DIFFERENTIAL/PLATELET
Basophils Absolute: 0.1 10*3/uL (ref 0.0–0.1)
Basophils Relative: 0.6 % (ref 0.0–3.0)
Eosinophils Absolute: 0.1 10*3/uL (ref 0.0–0.7)
Eosinophils Relative: 1.6 % (ref 0.0–5.0)
HCT: 42.7 % (ref 36.0–46.0)
Hemoglobin: 14.3 g/dL (ref 12.0–15.0)
Lymphocytes Relative: 37.2 % (ref 12.0–46.0)
Lymphs Abs: 2.9 10*3/uL (ref 0.7–4.0)
MCHC: 33.4 g/dL (ref 30.0–36.0)
MCV: 87 fl (ref 78.0–100.0)
Monocytes Absolute: 0.6 10*3/uL (ref 0.1–1.0)
Monocytes Relative: 7.2 % (ref 3.0–12.0)
Neutro Abs: 4.2 10*3/uL (ref 1.4–7.7)
Neutrophils Relative %: 53.4 % (ref 43.0–77.0)
Platelets: 212 10*3/uL (ref 150.0–400.0)
RBC: 4.91 Mil/uL (ref 3.87–5.11)
RDW: 13.9 % (ref 11.5–15.5)
WBC: 7.9 10*3/uL (ref 4.0–10.5)

## 2018-11-07 LAB — COMPREHENSIVE METABOLIC PANEL
ALT: 13 U/L (ref 0–35)
AST: 19 U/L (ref 0–37)
Albumin: 4.3 g/dL (ref 3.5–5.2)
Alkaline Phosphatase: 77 U/L (ref 39–117)
BUN: 12 mg/dL (ref 6–23)
CO2: 28 mEq/L (ref 19–32)
Calcium: 9.9 mg/dL (ref 8.4–10.5)
Chloride: 104 mEq/L (ref 96–112)
Creatinine, Ser: 0.72 mg/dL (ref 0.40–1.20)
GFR: 84.59 mL/min (ref >60.00)
Glucose, Bld: 81 mg/dL (ref 70–99)
Potassium: 4 mEq/L (ref 3.5–5.1)
Sodium: 141 mEq/L (ref 135–145)
Total Bilirubin: 0.5 mg/dL (ref 0.2–1.2)
Total Protein: 6.4 g/dL (ref 6.0–8.3)

## 2018-11-07 LAB — HEMOGLOBIN A1C: Hgb A1c MFr Bld: 5.8 % (ref 4.6–6.5)

## 2018-11-07 NOTE — Assessment & Plan Note (Signed)
Patient has signs and symptoms more consistent with a frozen shoulder and possible subacromial bursitis.  Discussed with patient icing regimen, home exercises, which activities to do which was to avoid.  Patient to increase activity as tolerated.  Discussed with icing regimen.  Patient will increase activity as tolerated, follow-up again in 4 to 6 weeks.

## 2018-11-07 NOTE — Assessment & Plan Note (Signed)
Decision today to treat with OMT was based on Physical Exam  After verbal consent patient was treated with HVLA, ME, FPR techniques in cervical, thoracic, lumbar and sacral areas  Patient tolerated the procedure well with improvement in symptoms  Patient given exercises, stretches and lifestyle modifications  See medications in patient instructions if given  Patient will follow up in 4-8 weeks 

## 2018-11-07 NOTE — Patient Instructions (Signed)
See me again in 6 weeks Keep pushing yourself!

## 2018-11-20 ENCOUNTER — Other Ambulatory Visit: Payer: Self-pay | Admitting: Family Medicine

## 2018-11-20 DIAGNOSIS — E1169 Type 2 diabetes mellitus with other specified complication: Secondary | ICD-10-CM

## 2018-11-21 ENCOUNTER — Ambulatory Visit (INDEPENDENT_AMBULATORY_CARE_PROVIDER_SITE_OTHER): Payer: BC Managed Care – PPO | Admitting: Psychology

## 2018-11-21 DIAGNOSIS — F4322 Adjustment disorder with anxiety: Secondary | ICD-10-CM | POA: Diagnosis not present

## 2018-11-22 ENCOUNTER — Encounter (INDEPENDENT_AMBULATORY_CARE_PROVIDER_SITE_OTHER): Payer: Self-pay | Admitting: Family Medicine

## 2018-11-25 NOTE — Telephone Encounter (Signed)
Please review

## 2018-11-26 NOTE — Telephone Encounter (Signed)
Copied from [place] [id number]. Topic: General - Other >> Nov 26, 2018 10:58 AM Kendra Austin wrote: Patient requesting call back from [facility] to discuss mychart message from today.

## 2018-11-28 ENCOUNTER — Ambulatory Visit: Payer: BC Managed Care – PPO | Admitting: Family Medicine

## 2018-12-05 ENCOUNTER — Other Ambulatory Visit: Payer: Self-pay | Admitting: Family Medicine

## 2018-12-05 DIAGNOSIS — E1169 Type 2 diabetes mellitus with other specified complication: Secondary | ICD-10-CM

## 2018-12-09 ENCOUNTER — Other Ambulatory Visit: Payer: Self-pay

## 2018-12-09 DIAGNOSIS — Z20822 Contact with and (suspected) exposure to covid-19: Secondary | ICD-10-CM

## 2018-12-11 LAB — NOVEL CORONAVIRUS, NAA: SARS-CoV-2, NAA: NOT DETECTED

## 2018-12-16 ENCOUNTER — Encounter: Payer: Self-pay | Admitting: Family Medicine

## 2018-12-16 ENCOUNTER — Ambulatory Visit: Payer: BC Managed Care – PPO | Admitting: Family Medicine

## 2018-12-16 ENCOUNTER — Other Ambulatory Visit: Payer: Self-pay

## 2018-12-16 VITALS — BP 110/76 | HR 78 | Temp 97.6°F | Resp 16 | Ht 64.8 in | Wt 168.0 lb

## 2018-12-16 DIAGNOSIS — E1169 Type 2 diabetes mellitus with other specified complication: Secondary | ICD-10-CM

## 2018-12-16 DIAGNOSIS — Z23 Encounter for immunization: Secondary | ICD-10-CM

## 2018-12-16 DIAGNOSIS — R1013 Epigastric pain: Secondary | ICD-10-CM | POA: Diagnosis not present

## 2018-12-16 DIAGNOSIS — Z7689 Persons encountering health services in other specified circumstances: Secondary | ICD-10-CM | POA: Diagnosis not present

## 2018-12-16 DIAGNOSIS — E785 Hyperlipidemia, unspecified: Secondary | ICD-10-CM

## 2018-12-16 MED ORDER — OMEPRAZOLE 40 MG PO CPDR: DELAYED_RELEASE_CAPSULE | ORAL | 1 refills | Status: DC

## 2018-12-16 MED ORDER — TRULICITY 0.75 MG/0.5ML ~~LOC~~ SOAJ: SUBCUTANEOUS | 5 refills | Status: DC

## 2018-12-16 MED ORDER — ROSUVASTATIN CALCIUM 5 MG PO TABS: 5.0000 mg | ORAL_TABLET | Freq: Every day | ORAL | 3 refills | Status: DC

## 2018-12-16 NOTE — Progress Notes (Signed)
Patient ID: Kendra Austin, female  DOB: 24-Jul-1965, 53 y.o.   MRN: [id number] Patient Care Team    Relationship Specialty Notifications Start End  Ma Hillock, DO PCP - General Family Medicine  12/16/18   Doran Stabler, MD Consulting Physician Gastroenterology  12/16/18   Cheri Fowler, MD Consulting Physician Obstetrics and Gynecology  12/16/18     Chief Complaint  Patient presents with  . Establish Care    Prior PCP Dr Juleen China. Needs refills on medications. Physcian for women.     Subjective:  Kendra Austin is a 53 y.o.  female present for TOC/CMC All past medical history, surgical history, allergies, family history, immunizations, medications and social history were Updated in the electronic medical record today. All recent labs, ED visits and hospitalizations within the last year were reviewed.  Type 2 diabetes mellitus with other specified complication, without long-term current use of insulin ([facility]) Pt reports compliance with Trulicuty. She started this particular medication over hte summer. She was taking metformin prior with stomach upset.  Denies numbness, tingling of extremities, hypo/hyperglycemic events or non-healing wounds. PNA series: PNA 23 provided today Flu shot: UTD 2020 (recommneded yearly) BMP: WNL 10/2018 Foot exam: completed today - 12/16/2018 Eye exam: 11/2017> she will schedule. It has been pushed back from pandemic.  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  A1c: 5.8 (10/2018)   Hyperlipidemia associated with type 2 diabetes mellitus ([place]) Tolerating statin.   Dyspepsia Taking omprazole 40 mg QD. Has been unable to taper down.    Depression screen [facility] 2/9 07/01/2018 03/01/2017  Decreased Interest 0 0  Down, Depressed, Hopeless 0 0  PHQ - 2 Score 0 0  Altered sleeping 0 -  Tired, decreased energy 0 -  Change in appetite 0 -  Feeling bad or failure about yourself  0 -  Trouble concentrating 0 -  Moving slowly or fidgety/restless 0 -  Suicidal  thoughts 0 -  PHQ-9 Score 0 -  Difficult doing work/chores Not difficult at all -   No flowsheet data found.      Fall Risk  07/29/2018 07/01/2018 05/08/2018  Falls in the past year? 0 0 1  Number falls in past yr: 0 0 0  Injury with Fall? 0 0 1  Comment - - sprain ankle      Immunization History  Administered Date(s) Administered  . Influenza,inj,Quad PF,6+ Mos 11/07/2018  . Influenza-Unspecified 10/24/2015, 10/23/2016  . Tdap 01/24/2011    No exam data present  Past Medical History:  Diagnosis Date  . Allergy   . Arthritis    osteoarthritis; neck, R shoulder  . Atypical chest pain    has had prior cardiac eval in the past/ started 4 years ago/ corrected by back adjustmment  . Chicken pox   . Complication of anesthesia    Per pt, sensitive to sedation!  . Depression    related to stress from prior job; no medications, hospitalizations or SI  . Diabetes mellitus without complication ([place])   . DUB (dysfunctional uterine bleeding) 07/22/2015  . Essential tremor 07/22/2015   left hand  . Genital warts   . GERD (gastroesophageal reflux disease)   . Hemorrhoids   . History of UTI   . Hyperglycemia    Pre-diabetic per pt!  . Hyperlipidemia   . Seasonal allergies 07/22/2015  . Vertigo    resolves with chiropratic; and sees Dr. Tamala Julian   Allergies  Allergen Reactions  . Benadryl [Diphenhydramine Hcl (Sleep)]  Dizzy, extremely drowsy, can cause a lot effects per pt!  . Other     Muscle relaxers-per patient she "cannot function"  . Aleve [Naproxen Sodium]     Heart palpitations - can take ibuprofen  . Flexeril [Cyclobenzaprine] Other (See Comments)    Makes her extremely tired  . Meloxicam     Made her depressed   Past Surgical History:  Procedure Laterality Date  . COLONOSCOPY    . GANGLION CYST EXCISION     right wrist  . HYSTEROSCOPY    . TONSILLECTOMY AND ADENOIDECTOMY  1971/1972  . UPPER GASTROINTESTINAL ENDOSCOPY    . WISDOM TOOTH EXTRACTION     Family  History  Problem Relation Age of Onset  . Macular degeneration Mother   . Colon polyps Mother   . Heart disease Father   . Diabetes Father   . Hyperlipidemia Father   . Macular degeneration Maternal Grandmother   . Heart disease Maternal Grandmother   . Stroke Maternal Grandmother   . Heart disease Paternal Grandmother   . Heart attack Paternal Grandmother   . Heart disease Paternal Grandfather   . Heart attack Paternal Grandfather   . Colon cancer Neg Hx   . Esophageal cancer Neg Hx   . Stomach cancer Neg Hx   . Rectal cancer Neg Hx    Social History   Social History Narrative   Work or School: Hydrologist; Animal nutritionist    Lifestyle: no regular exercise; diet is pretty good - avoids gluten   Marital status/children/pets: Divorced   Safety:      -smoke alarm in the home:Yes     - wears seatbelt: Yes             Allergies as of 12/16/2018      Reactions   Benadryl [diphenhydramine Hcl (sleep)]    Dizzy, extremely drowsy, can cause a lot effects per pt!   Other    Muscle relaxers-per patient she "cannot function"   Aleve [naproxen Sodium]    Heart palpitations - can take ibuprofen   Flexeril [cyclobenzaprine] Other (See Comments)   Makes her extremely tired   Meloxicam    Made her depressed      Medication List       Accurate as of December 16, 2018  3:29 PM. If you have any questions, ask your nurse or doctor.        CULTURELLE PO Take by mouth daily.   FISH OIL PO Take by mouth.   fluticasone 50 MCG/ACT nasal spray Commonly known as: FLONASE Place 2 sprays into both nostrils daily.   LUTEIN PO Take by mouth.   MULTIVITAMIN ADULT PO Take by mouth.   omeprazole 40 MG capsule Commonly known as: PRILOSEC TAKE 1 CAPSULE BY MOUTH EVERY DAY   rosuvastatin 5 MG tablet Commonly known as: Crestor Take 1 tablet (5 mg total) by mouth daily.   Trulicity [id number] 0000000 Sopn Generic drug: Dulaglutide INJECT 0.75MG  INTO THE SKIN WEEKLY    TURMERIC PO Take 1 capsule by mouth daily.   VITAMIN D PO Take by mouth.       All past medical history, surgical history, allergies, family history, immunizations andmedications were updated in the EMR today and reviewed under the history and medication portions of their EMR.      ROS: 14 pt review of systems performed and negative (unless mentioned in an HPI)  Objective: BP 110/76 (BP Location: Right Arm, Patient Position: Sitting, Cuff Size: Normal)  Pulse 78   Temp 97.6 F (36.4 C) (Temporal)   Resp 16   Ht 5' 4.8" (1.646 m)   Wt 168 lb (76.2 kg)   LMP 01/11/2018   SpO2 99%   BMI 28.13 kg/m  Gen: Afebrile. No acute distress. Nontoxic in appearance, well-developed, well-nourished,  Very pleasant overweight female. HENT: AT. [place].MMM Eyes:Pupils Equal Round Reactive to light, Extraocular movements intact,  Conjunctiva without redness, discharge or icterus. Neck/lymp/endocrine: Supple,no lymphadenopathy, no thyromegaly CV: RRR no murmur, no edema, +2/4 P posterior tibialis pulses.  Chest: CTAB, no wheeze, rhonchi or crackles.  Abd: Soft. NTND. BS present. no Masses palpated. Skin: no rashes, purpura or petechiae. Warm and well-perfused. Skin intact. Neuro/Msk:  Normal gait. PERLA. EOMi. Alert. Oriented x3.   Psych: Normal affect, dress and demeanor. Normal speech. Normal thought content and judgment.   Assessment/plan: Kendra Austin is a 53 y.o. female present for TOC/CMC Type 2 diabetes mellitus with other specified complication, without long-term current use of insulin ([place]) Stable.  PNA series: PNA 23 provided today Flu shot: UTD 2020 (recommneded yearly) BMP: WNL 10/2018 Foot exam: completed today - 12/16/2018 Eye exam: 11/2017> she will schedule. It has been pushed back from pandemic.  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  A1c: 5.8 (10/2018)  Hyperlipidemia associated with type 2 diabetes mellitus ([place]) Tolerating statin. Refilled statin  For her today   Dyspepsia Refills provided on omprazole 40 mg QD. Has been unable to taper down.    Return in about 4 months (around 04/15/2019).  This visit occurred during the SARS-CoV-2 public health emergency.  Safety protocols were in place, including screening questions prior to the visit, additional usage of staff PPE, and extensive cleaning of exam room while observing appropriate contact time as indicated for disinfecting solutions.    Note is dictated utilizing voice recognition software. Although note has been proof read prior to signing, occasional typographical errors still can be missed. If any questions arise, please do not hesitate to call for verification.  Electronically signed by: Howard Pouch, DO [place]

## 2018-12-16 NOTE — Patient Instructions (Addendum)
Pleasure to meet you today.  We will call you with lab results once available.  Follow up 4 months for diabetes.  Please help [place] help you:  We are honored you have chosen [facility] for your Primary Care home. Below you will find basic instructions that you may need to access in the future. Please help [place] help you by reading the instructions, which cover many of the frequent questions we experience.   Prescription refills and request:  -In order to allow more efficient response time, please call your pharmacy for all refills. They will forward the request electronically to [place]. This allows for the quickest possible response. Request left on a nurse line can take longer to refill, since these are checked as time allows between office patients and other phone calls.  - refill request can take up to 3-5 working days to complete.  - If request is sent electronically and request is appropiate, it is usually completed in 1-2 business days.  - all patients will need to be seen routinely for all chronic medical conditions requiring prescription medications (see follow-up below). If you are overdue for follow up on your condition, you will be asked to make an appointment and we will call in enough medication to cover you until your appointment (up to 30 days).  - all controlled substances will require a face to face visit to request/refill.  - if you desire your prescriptions to go through a new pharmacy, and have an active script at original pharmacy, you will need to call your pharmacy and have scripts transferred to new pharmacy. This is completed between the pharmacy locations and not by your provider.    Results: If any images or labs were ordered, it can take up to 1 week to get results depending on the test ordered and the lab/facility running and resulting the test. - Normal or stable results, which do not need further discussion, may be released to your mychart immediately with attached note  to you. A call may not be generated for normal results. Please make certain to sign up for mychart. If you have questions on how to activate your mychart you can call the front office.  - If your results need further discussion, our office will attempt to contact you via phone, and if unable to reach you after 2 attempts, we will release your abnormal result to your mychart with instructions.  - All results will be automatically released in mychart after 1 week.  - Your provider will provide you with explanation and instruction on all relevant material in your results. Please keep in mind, results and labs may appear confusing or abnormal to the untrained eye, but it does not mean they are actually abnormal for you personally. If you have any questions about your results that are not covered, or you desire more detailed explanation than what was provided, you should make an appointment with your provider to do so.   Our office handles many outgoing and incoming calls daily. If we have not contacted you within 1 week about your results, please check your mychart to see if there is a message first and if not, then contact our office.  In helping with this matter, you help decrease call volume, and therefore allow [place] to be able to respond to patients needs more efficiently.   Acute office visits (sick visit):  An acute visit is intended for a new problem and are scheduled in shorter time slots to allow  schedule openings for patients with new problems. This is the appropriate visit to discuss a new problem. Problems will not be addressed by phone call or Echart message. Appointment is needed if requesting treatment. In order to provide you with excellent quality medical care with proper time for you to explain your problem, have an exam and receive treatment with instructions, these appointments should be limited to one new problem per visit. If you experience a new problem, in which you desire to be addressed,  please make an acute office visit, we save openings on the schedule to accommodate you. Please do not save your new problem for any other type of visit, let us take care of it properly and quickly for you.   Follow up visits:  Depending on your condition(s) your provider will need to see you routinely in order to provide you with quality care and prescribe medication(s). Most chronic conditions (Example: hypertension, Diabetes, depression/anxiety... etc), require visits a couple times a year. Your provider will instruct you on proper follow up for your personal medical conditions and history. Please make certain to make follow up appointments for your condition as instructed. Failing to do so could result in lapse in your medication treatment/refills. If you request a refill, and are overdue to be seen on a condition, we will always provide you with a 30 day script (once) to allow you time to schedule.    Medicare wellness (well visit): - we have a wonderful Nurse Kendra Austin), that will meet with you and provide you will yearly medicare wellness visits. These visits should occur yearly (can not be scheduled less than 1 calendar year apart) and cover preventive health, immunizations, advance directives and screenings you are entitled to yearly through your medicare benefits. Do not miss out on your entitled benefits, this is when medicare will pay for these benefits to be ordered for you.  These are strongly encouraged by your provider and is the appropriate type of visit to make certain you are up to date with all preventive health benefits. If you have not had your medicare wellness exam in the last 12 months, please make certain to schedule one by calling the office and schedule your medicare wellness with Kendra Austin as soon as possible.   Yearly physical (well visit):  - Adults are recommended to be seen yearly for physicals. Check with your insurance and date of your last physical, most insurances require one  calendar year between physicals. Physicals include all preventive health topics, screenings, medical exam and labs that are appropriate for gender/age and history. You may have fasting labs needed at this visit. This is a well visit (not a sick visit), new problems should not be covered during this visit (see acute visit).  - Pediatric patients are seen more frequently when they are younger. Your provider will advise you on well child visit timing that is appropriate for your their age. - This is not a medicare wellness visit. Medicare wellness exams do not have an exam portion to the visit. Some medicare companies allow for a physical, some do not allow a yearly physical. If your medicare allows a yearly physical you can schedule the medicare wellness with our nurse Kendra Austin and have your physical with your provider after, on the same day. Please check with insurance for your full benefits.   Late Policy/No Shows:  - all new patients should arrive 15-30 minutes earlier than appointment to allow [place] time  to  obtain all personal demographics,  insurance  information and for you to complete office paperwork. - All established patients should arrive 10-15 minutes earlier than appointment time to update all information and be checked in .  - In our best efforts to run on time, if you are late for your appointment you will be asked to either reschedule or if able, we will work you back into the schedule. There will be a wait time to work you back in the schedule,  depending on availability.  - If you are unable to make it to your appointment as scheduled, please call 24 hours ahead of time to allow [place] to fill the time slot with someone else who needs to be seen. If you do not cancel your appointment ahead of time, you may be charged a no show fee.

## 2018-12-17 LAB — MICROALBUMIN / CREATININE URINE RATIO
Creatinine,U: 123.9 mg/dL
Microalb Creat Ratio: 0.6 mg/g (ref 0.0–30.0)
Microalb, Ur: <0.7 mg/dL (ref 0.0–1.9)

## 2018-12-18 ENCOUNTER — Encounter: Payer: Self-pay | Admitting: Family Medicine

## 2018-12-24 ENCOUNTER — Ambulatory Visit (INDEPENDENT_AMBULATORY_CARE_PROVIDER_SITE_OTHER): Payer: BC Managed Care – PPO | Admitting: Family Medicine

## 2018-12-24 ENCOUNTER — Encounter: Payer: Self-pay | Admitting: Family Medicine

## 2018-12-24 ENCOUNTER — Other Ambulatory Visit: Payer: Self-pay

## 2018-12-24 VITALS — BP 106/68 | HR 62 | Ht 64.0 in | Wt 168.0 lb

## 2018-12-24 DIAGNOSIS — R293 Abnormal posture: Secondary | ICD-10-CM

## 2018-12-24 DIAGNOSIS — M999 Biomechanical lesion, unspecified: Secondary | ICD-10-CM

## 2018-12-24 NOTE — Progress Notes (Signed)
[facility] Kendra Austin, Kendra [place] [address] Phone: [phone] Subjective:   Kendra Austin, am serving as a scribe for Dr. Hulan Saas.  This visit occurred during the SARS-CoV-2 public health emergency.  Safety protocols were in place, including screening questions prior to the visit, additional usage of staff PPE, and extensive cleaning of exam room while observing appropriate contact time as indicated for disinfecting solutions.     CC: , neck pain follow-up back pain follow up    [id number]   1015/2020 Patient has signs and symptoms more consistent with a frozen shoulder and possible subacromial bursitis.  Discussed with patient icing regimen, home exercises, which activities to do which was to avoid.  Patient to increase activity as tolerated.  Discussed with icing regimen.  Patient will increase activity as tolerated, follow-up again in 4 to 6 weeks.  Update 12/24/2018 Kendra Austin is a 53 y.o. female coming in with complaint of left shoulder and back pain. Last seen on 11/07/2018 for OMT. Shoulder pain has decreased with stretching. Has been swimming without any issues.   Back is stiff but otherwise Austin change since last visit. 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  Feels that that causes more tightness overall.      Past Medical History:  Diagnosis Date  . Allergy   . Arthritis    osteoarthritis; neck, R shoulder  . Atypical chest pain    has had prior cardiac eval in the past/ started 4 years ago/ corrected by back adjustmment  . Chicken pox   . Complication of anesthesia    Per pt, sensitive to sedation!  . Depression    related to stress from prior job; Austin medications, hospitalizations or SI  . Diabetes mellitus without complication ([place])   . DUB (dysfunctional uterine bleeding) 07/22/2015  . Essential tremor 07/22/2015   left hand  . Genital warts   . GERD (gastroesophageal reflux disease)   . Hemorrhoids   . History of UTI    . Hyperglycemia    Pre-diabetic per pt!  . Hyperlipidemia   . Seasonal allergies 07/22/2015  . Vertigo    resolves with chiropratic; and sees Dr. Tamala Julian   Past Surgical History:  Procedure Laterality Date  . COLONOSCOPY    . GANGLION CYST EXCISION     right wrist  . HYSTEROSCOPY    . TONSILLECTOMY AND ADENOIDECTOMY  1971/1972  . UPPER GASTROINTESTINAL ENDOSCOPY    . WISDOM TOOTH EXTRACTION     Social History   Socioeconomic History  . Marital status: Single    Spouse name: Not on file  . Number of children: Not on file  . Years of education: Not on file  . Highest education level: Not on file  Occupational History  . Occupation: Patent attorney: [employer]  . Financial resource strain: Not on file  . Food insecurity    Worry: Not on file    Inability: Not on file  . Transportation needs    Medical: Not on file    Non-medical: Not on file  Tobacco Use  . Smoking status: Never Smoker  . Smokeless tobacco: Never Used  Substance and Sexual Activity  . Alcohol use: Yes    Alcohol/week: 0.0 standard drinks    Comment: Socially   . Drug use: Austin  . Sexual activity: Not Currently  Lifestyle  . Physical activity    Days per week: Not on file  Minutes per session: Not on file  . Stress: Not on file  Relationships  . Social Herbalist on phone: Not on file    Gets together: Not on file    Attends religious service: Not on file    Active member of club or organization: Not on file    Attends meetings of clubs or organizations: Not on file    Relationship status: Not on file  Other Topics Concern  . Not on file  Social History Narrative   Work or School: Hydrologist; Animal nutritionist    Lifestyle: Austin regular exercise; diet is pretty good - avoids gluten   Marital status/children/pets: Divorced   Safety:      -smoke alarm in the home:Yes     - wears seatbelt: Yes            Allergies  Allergen Reactions  .  Benadryl [Diphenhydramine Hcl (Sleep)]     Dizzy, extremely drowsy, can cause a lot effects per pt!  . Other     Muscle relaxers-per patient she "cannot function"  . Aleve [Naproxen Sodium]     Heart palpitations - can take ibuprofen  . Flexeril [Cyclobenzaprine] Other (See Comments)    Makes her extremely tired  . Meloxicam     Made her depressed   Family History  Problem Relation Age of Onset  . Macular degeneration Mother   . Colon polyps Mother   . Heart disease Father   . Diabetes Father   . Hyperlipidemia Father   . Macular degeneration Maternal Grandmother   . Heart disease Maternal Grandmother   . Stroke Maternal Grandmother   . Heart disease Paternal Grandmother   . Heart attack Paternal Grandmother   . Heart disease Paternal Grandfather   . Heart attack Paternal Grandfather   . Colon cancer Neg Hx   . Esophageal cancer Neg Hx   . Stomach cancer Neg Hx   . Rectal cancer Neg Hx     Current Outpatient Medications (Endocrine & Metabolic):  [facility]  Dulaglutide (TRULICITY) A999333 [id number] SOPN, INJECT 0.75MG  INTO THE SKIN WEEKLY   Current Outpatient Medications (Cardiovascular):  .  rosuvastatin (CRESTOR) 5 MG tablet, Take 1 tablet (5 mg total) by mouth daily.   Current Outpatient Medications (Respiratory):  .  fluticasone (FLONASE) 50 MCG/ACT nasal spray, Place 2 sprays into both nostrils daily.       Current Outpatient Medications (Other):  [facility]  Cholecalciferol (VITAMIN D PO), Take by mouth. .  Lactobacillus Rhamnosus, GG, (CULTURELLE PO), Take by mouth daily. .  LUTEIN PO, Take by mouth. .  Multiple Vitamins-Minerals (MULTIVITAMIN ADULT PO), Take by mouth. .  Omega-3 Fatty Acids (FISH OIL PO), Take by mouth. [facility]  omeprazole (PRILOSEC) 40 MG capsule, TAKE 1 CAPSULE BY MOUTH EVERY DAY .  TURMERIC PO, Take 1 capsule by mouth daily.   Current Facility-Administered Medications (Other):  .  0.9 %  sodium chloride infusion    Past medical history, social, surgical  and family history all reviewed in electronic medical record.  Austin pertanent information unless stated regarding to the chief complaint.   Review of Systems:  Austin headache, visual changes, nausea, vomiting, diarrhea, constipation, dizziness, abdominal pain, skin rash, fevers, chills, night sweats, weight loss, swollen lymph nodes, body aches, joint swelling,  chest pain, shortness of breath, mood changes.  Positive muscle aches  Objective  Blood pressure 106/68, pulse 62, height 5\' 4"  (1.626 m), weight 168 lb (76.2 kg), last menstrual  period 01/11/2018, SpO2 99 %.    General: Austin apparent distress alert and oriented x3 mood and affect normal, dressed appropriately.  HEENT: Pupils equal, extraocular movements intact  Respiratory: Patient's speak in full sentences and does not appear short of breath  Cardiovascular: Austin lower extremity edema, non tender, Austin erythema  Skin: Warm dry intact with Austin signs of infection or rash on extremities or on axial skeleton.  Abdomen: Soft nontender  Neuro: Cranial nerves II through XII are intact, neurovascularly intact in all extremities with 2+ DTRs and 2+ pulses.  Lymph: Austin lymphadenopathy of posterior or anterior cervical chain or axillae bilaterally.  Gait normal with good balance and coordination.  MSK:  tender with full range of motion and good stability and symmetric strength and tone of shoulders, elbows, wrist, hip, knee and ankles bilaterally.  Patient does have some pain in the parascapular region right greater than left.  Patient does have tightness noted with Corky Sox test bilaterally but negative straight leg test.  Neck exam does have some mild loss of lordosis but near full range of motion.  Osteopathic findings  C2 flexed rotated and side bent right C4 flexed rotated and side bent left C6 flexed rotated and side bent left T3 extended rotated and side bent right inhaled third rib T9 extended rotated and side bent left L2 flexed rotated and side  bent right Sacrum right on right     Impression and Recommendations:     This case required medical decision making of moderate complexity. The above documentation has been reviewed and is accurate and complete Lyndal Pulley, DO       Note: This dictation was prepared with Dragon dictation along with smaller phrase technology. Any transcriptional errors that result from this process are unintentional.

## 2018-12-24 NOTE — Assessment & Plan Note (Signed)
Patient is doing relatively well, swimming seems to be helping the individual.  We discussed with patient to continue to work on posture and ergonomics, patient will be traveling and encouraged her to be safe during the coronavirus outbreak.  Patient will follow up with me again in 3 months

## 2018-12-24 NOTE — Assessment & Plan Note (Signed)
Decision today to treat with OMT was based on Physical Exam  After verbal consent patient was treated with HVLA, ME, FPR techniques in cervical, thoracic, lumbar and sacral areas  Patient tolerated the procedure well with improvement in symptoms  Patient given exercises, stretches and lifestyle modifications  See medications in patient instructions if given  Patient will follow up in 412 weeks

## 2018-12-24 NOTE — Patient Instructions (Signed)
      [address], 1st floor [place], [place] [address] Phone [phone]

## 2018-12-31 LAB — HM DIABETES EYE EXAM

## 2019-01-20 ENCOUNTER — Encounter: Payer: Self-pay | Admitting: Family Medicine

## 2019-01-28 ENCOUNTER — Telehealth: Payer: Self-pay

## 2019-01-28 NOTE — Telephone Encounter (Signed)
Pt was called and given information on isolating and where and how to get COVID test completed. Pt just would like to isolate herself for 10 days and not get tested. She was told she could call back at anytime to make appt if needed, she verbalized understanding

## 2019-01-28 NOTE — Telephone Encounter (Signed)
Patient traveled abroad and got back 01/26/19. Patient would like to know if 1 week quarantine then getting COVID test efficient.

## 2019-02-04 ENCOUNTER — Ambulatory Visit: Payer: BC Managed Care – PPO | Attending: Internal Medicine

## 2019-02-04 DIAGNOSIS — Z20822 Contact with and (suspected) exposure to covid-19: Secondary | ICD-10-CM

## 2019-02-05 ENCOUNTER — Other Ambulatory Visit: Payer: BC Managed Care – PPO

## 2019-02-05 LAB — NOVEL CORONAVIRUS, NAA: SARS-CoV-2, NAA: NOT DETECTED

## 2019-02-10 ENCOUNTER — Ambulatory Visit (INDEPENDENT_AMBULATORY_CARE_PROVIDER_SITE_OTHER): Payer: BC Managed Care – PPO | Admitting: Family Medicine

## 2019-02-10 ENCOUNTER — Other Ambulatory Visit: Payer: Self-pay

## 2019-02-10 ENCOUNTER — Encounter: Payer: Self-pay | Admitting: Family Medicine

## 2019-02-10 VITALS — Ht 64.0 in | Wt 160.0 lb

## 2019-02-10 DIAGNOSIS — B9689 Other specified bacterial agents as the cause of diseases classified elsewhere: Secondary | ICD-10-CM | POA: Diagnosis not present

## 2019-02-10 DIAGNOSIS — J329 Chronic sinusitis, unspecified: Secondary | ICD-10-CM

## 2019-02-10 MED ORDER — DOXYCYCLINE HYCLATE 100 MG PO TABS: 100.0000 mg | ORAL_TABLET | Freq: Two times a day (BID) | ORAL | 0 refills | Status: DC

## 2019-02-10 MED ORDER — BENZONATATE 100 MG PO CAPS: 100.0000 mg | ORAL_CAPSULE | Freq: Three times a day (TID) | ORAL | 0 refills | Status: DC | PRN

## 2019-02-10 NOTE — Patient Instructions (Signed)
   COVID vaccines are starting for the community.  1. Health Department: Appointments are required .Once your age bracket is called (watch news and visit website below for info) appt can be made by calling [phone] and selecting option 2.  Walk-ins will not be accepted.                  Clinic locations are: [facility], [place]; [facility] [address] [place]; . [place] at [facility], [address], [id number], [place]. Visit [email or web address] and click on the "[id number] Vaccine Info" rectangle for more information about vaccinations.  2. [facility] web page will also have up to date info on vaccinations offered through [place] at  [email or web address] - Appt also required.  - vaccines are administered at the [facility] ([facility] hosp). . If website indicates you are eligible for vaccine- please call [phone]   3. [facility]  -This will be done according to the rollout plan published by the [place] ([place]). The first public phase, also known as "Phase 1(b)", is for individuals 30 and older. Location: [facility] ([facility] and Conservation officer, historic buildings) [address]; [place] [address] -  All citizens receiving a vaccine must complete a COVID-19 consent form. To lessen your wait time, these can be found and completed ahead of time at [email or web address] or you may pick up a paper copy at the [facility] [phone] 74 in [place]) or at [employer]. - Future dates and locations will be widely shared through the [place] [facility]'s XX123456 Hotline at ([phone], media outlets in the [place] and through the [employer]  ([email or web address]..[email or web address] and [email or web address]). Citizens can also contact the [place] at [phone] for more information   Participants are asked to wear a face covering at vaccination sites.

## 2019-02-10 NOTE — Progress Notes (Signed)
VIRTUAL VISIT VIA VIDEO  I connected with Kendra Austin on 02/10/19 at 11:00 AM EST by a video enabled telemedicine application and verified that I am speaking with the correct person using two identifiers. Location patient: Home Location provider: [facility], Office Persons participating in the virtual visit: Patient, Dr. Raoul Pitch and R.Baker, LPN  I discussed the limitations of evaluation and management by telemedicine and the availability of in person appointments. The patient expressed understanding and agreed to proceed.   SUBJECTIVE Chief Complaint  Patient presents with  . Nasal Congestion    Pt is having nasal drainage, sinus pain that started 01/31/2019. Pt went out of the country and during the 10 days   . Facial Pain    HPI: Kendra Austin is a 54 y.o. female present for upper respiratory infection.  She returned from [place] on Sunday, January 3.  She first noted symptoms of nasal congestion on January 8.  She was tested on January 12 at [facility] and was negative.  She reports she is really had no contacts surrounding her other than her travel history.  She endorses nasal congestion, postnasal drip and facial pain.  She is not taking an antihistamine.  She denies fever, chills, engine taste perception, loss of smell, headache.  ROS: See pertinent positives and negatives per HPI.  Patient Active Problem List   Diagnosis Date Noted  . 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 Type 2 diabetes mellitus with other specified complication ([place]) [id number]  . Dyspepsia 05/12/2018  . BMI 30.0-30.9,adult 11/23/2016  . Hyperlipidemia associated with type 2 diabetes mellitus ([place]) 03/27/2016  . Essential tremor 07/22/2015  . Seasonal allergies 07/22/2015  . DUB (dysfunctional uterine bleeding) 07/22/2015  . Posture imbalance 11/26/2014  . Nonallopathic lesion of cervical region 11/26/2014  . Nonallopathic lesion of thoracic region 11/26/2014  . Nonallopathic  lesion of lumbosacral region 11/26/2014    Social History   Tobacco Use  . Smoking status: Never Smoker  . Smokeless tobacco: Never Used  Substance Use Topics  . Alcohol use: Yes    Alcohol/week: 0.0 standard drinks    Comment: Socially     Current Outpatient Medications:  .  Cholecalciferol (VITAMIN D PO), Take by mouth., Disp: , Rfl:  .  Dulaglutide (TRULICITY) [id number] [id number] SOPN, INJECT 0.75MG  INTO THE SKIN WEEKLY, Disp: 4 pen, Rfl: 5 .  fluticasone (FLONASE) 50 MCG/ACT nasal spray, Place 2 sprays into both nostrils daily., Disp: , Rfl:  .  Lactobacillus Rhamnosus, GG, (CULTURELLE PO), Take by mouth daily., Disp: , Rfl:  .  LUTEIN PO, Take by mouth., Disp: , Rfl:  .  Multiple Vitamins-Minerals (MULTIVITAMIN ADULT PO), Take by mouth., Disp: , Rfl:  .  Omega-3 Fatty Acids (FISH OIL PO), Take by mouth., Disp: , Rfl:  .  omeprazole (PRILOSEC) 40 MG capsule, TAKE 1 CAPSULE BY MOUTH EVERY DAY, Disp: 90 capsule, Rfl: 1 .  rosuvastatin (CRESTOR) 5 MG tablet, Take 1 tablet (5 mg total) by mouth daily., Disp: 90 tablet, Rfl: 3 .  TURMERIC PO, Take 1 capsule by mouth daily. , Disp: , Rfl:   Current Facility-Administered Medications:  .  0.9 %  sodium chloride infusion, 500 mL, Intravenous, Once, Danis, Kirke Corin, MD  Allergies  Allergen Reactions  . Benadryl [Diphenhydramine Hcl (Sleep)]     Dizzy, extremely drowsy, can cause a lot effects per pt!  . Other     Muscle relaxers-per patient she "cannot function"  . Aleve [  Naproxen Sodium]     Heart palpitations - can take ibuprofen  . Flexeril [Cyclobenzaprine] Other (See Comments)    Makes her extremely tired  . Meloxicam     Made her depressed    OBJECTIVE: Ht 5\' 4"  (1.626 m)   Wt 160 lb (72.6 kg)   LMP 01/11/2018   BMI 27.46 kg/m  Gen: No acute distress. Nontoxic in appearance.  Nasal congestion present. HENT: AT. [place].  MMM.  Eyes:Pupils Equal Round Reactive to light, Extraocular movements intact,  Conjunctiva without  redness, discharge or icterus. Chest: Mild cough present, no shortness of breath Neuro:  Alert. Oriented x3  Psych: Normal affect, dress and demeanor. Normal speech. Normal thought content and judgment.  ASSESSMENT AND PLAN: Kendra Austin is a 53 y.o. female present for  Bacterial sinusitis -Unlikely Covid infection, however difficult to be certain.  Could have been a false negative test since completed 3-1/2 days into her symptoms.  However her history suggest she routinely has a sinus infection around this time yearly. -To be safe encouraged her to quarantine until the end of this week and treat for sinus infection. Rest, hydrate.  + flonase, mucinex (DM if cough), nettie pot or nasal saline.  +/- tessalon perles.  Doxycycline twice daily prescribed, take until completed.  If cough present it can last up to 6-8 weeks.  F/U 2 weeks of not improved.   No orders of the defined types were placed in this encounter.   Meds ordered this encounter  Medications  . doxycycline (VIBRA-TABS) 100 MG tablet    Sig: Take 1 tablet (100 mg total) by mouth 2 (two) times daily.    Dispense:  20 tablet    Refill:  0  . benzonatate (TESSALON PERLES) 100 MG capsule    Sig: Take 1 capsule (100 mg total) by mouth 3 (three) times daily as needed.    Dispense:  20 capsule    Refill:  Nicholas, DO 02/10/2019

## 2019-02-11 ENCOUNTER — Encounter: Payer: Self-pay | Admitting: Family Medicine

## 2019-02-11 ENCOUNTER — Ambulatory Visit (INDEPENDENT_AMBULATORY_CARE_PROVIDER_SITE_OTHER): Payer: BC Managed Care – PPO | Admitting: Family Medicine

## 2019-02-11 DIAGNOSIS — R293 Abnormal posture: Secondary | ICD-10-CM

## 2019-02-11 MED ORDER — PREDNISONE 50 MG PO TABS: 50.0000 mg | ORAL_TABLET | Freq: Every day | ORAL | 0 refills | Status: DC

## 2019-02-11 NOTE — Progress Notes (Signed)
Virtual Visit via Video Note  I connected with Kendra Austin on 02/11/19 at 10:15 AM EST by a video enabled telemedicine application and verified that I am speaking with the correct person using two identifiers.  Location: Patient: In home setting alone Provider: In office setting   I discussed the limitations of evaluation and management by telemedicine and the availability of in person appointments. The patient expressed understanding and agreed to proceed.  History of Present Illness: Last seen on 12/24/2018 for OMT. Patient states that while she was on vacation her mother fell and injured herself. Due to her mother's injury, patient was having to help her out of chairs and move her in the bed. Patient has felt an increase in her lower back pain that is usually managed with OMT. Pain is worse in the morning and after sitting or driving. Is more prevalent on the left than the right side but can be on both sides. Her pain can wrap around to the anterior pelvis. Denies any radiating symptoms down her legs.    Observations/Objective: Alert and oriented x3, sitting comfortably in front of her computer, joking with me but does change positions fairly regularly secondary to tightness   Assessment and Plan: As stated in the problem list   Follow Up Instructions:    I discussed the assessment and treatment plan with the patient. The patient was provided an opportunity to ask questions and all were answered. The patient agreed with the plan and demonstrated an understanding of the instructions.   The patient was advised to call back or seek an in-person evaluation if the symptoms worsen or if the condition fails to improve as anticipated.  I provided 26 minutes of face-to-face time during this encounter.   Lyndal Pulley, DO

## 2019-02-11 NOTE — Assessment & Plan Note (Signed)
Patient is having an exacerbation of her low back pain.  Prednisone given today.  Patient has had many different allergies to other medications and declined anything such as muscle relaxer or gabapentin at the moment.  Patient is on antibiotics so I think she will do relatively well with the steroid.  Discussed with patient about potential side effects.  Worsening pain though after 5 days we need to consider the possibility of an imaging.  Patient is in agreement with the plan.  Follow-up with me with a MyChart message on Monday.

## 2019-02-17 ENCOUNTER — Encounter: Payer: Self-pay | Admitting: Family Medicine

## 2019-03-25 ENCOUNTER — Encounter: Payer: Self-pay | Admitting: Family Medicine

## 2019-03-25 ENCOUNTER — Ambulatory Visit: Payer: BC Managed Care – PPO | Admitting: Family Medicine

## 2019-03-25 ENCOUNTER — Ambulatory Visit (INDEPENDENT_AMBULATORY_CARE_PROVIDER_SITE_OTHER): Payer: BC Managed Care – PPO

## 2019-03-25 VITALS — BP 122/82 | HR 81 | Ht 64.0 in | Wt 169.0 lb

## 2019-03-25 DIAGNOSIS — M545 Low back pain, unspecified: Secondary | ICD-10-CM

## 2019-03-25 DIAGNOSIS — M999 Biomechanical lesion, unspecified: Secondary | ICD-10-CM | POA: Diagnosis not present

## 2019-03-25 DIAGNOSIS — R293 Abnormal posture: Secondary | ICD-10-CM | POA: Diagnosis not present

## 2019-03-25 NOTE — Assessment & Plan Note (Signed)
Decision today to treat with OMT was based on Physical Exam  After verbal consent patient was treated with HVLA, ME, FPR techniques in cervical, thoracic, lumbar and sacral areas  Patient tolerated the procedure well with improvement in symptoms  Patient given exercises, stretches and lifestyle modifications  See medications in patient instructions if given  Patient will follow up in 4-8 weeks 

## 2019-03-25 NOTE — Progress Notes (Signed)
[address] [place] Phone: [phone] Subjective:   [name], am serving as a scribe for Dr. Hulan Saas.. This visit occurred during the SARS-CoV-2 public health emergency.  Safety protocols were in place, including screening questions prior to the visit, additional usage of staff PPE, and extensive cleaning of exam room while observing appropriate contact time as indicated for disinfecting solutions.   I'm seeing this patient by the request  of:  Kuneff, Renee A, DO  CC: Low back pain follow-up  [id number]   02/11/2019 Patient is having an exacerbation of her low back pain.  Prednisone given today.  Patient has had many different allergies to other medications and declined anything such as muscle relaxer or gabapentin at the moment.  Patient is on antibiotics so I think she will do relatively well with the steroid.  Discussed with patient about potential side effects.  Worsening pain though after 5 days we need to consider the possibility of an imaging.  Patient is in agreement with the plan.  Follow-up with me with a MyChart message on Monday.  Update 03/25/2019 Kendra Austin is a 54 y.o. female coming in with complaint of low back pain. Patient is here for OMT. Patient states that her back pain has not changed. Pain is left sided worse than right. Pain is in glutes and into the thoracic spine.  Patient has been having more tightness still.  Does not seem to be able to get through it.  When she works out on a regular basis things have increasing pain so was only working out twice a week at the moment.       Past Medical History:  Diagnosis Date  . Allergy   . Arthritis    osteoarthritis; neck, R shoulder  . Atypical chest pain    has had prior cardiac eval in the past/ started 4 years ago/ corrected by back adjustmment  . Chicken pox   . Complication of anesthesia    Per pt, sensitive to sedation!  .  Depression    related to stress from prior job; no medications, hospitalizations or SI  . Diabetes mellitus without complication ([place])   . DUB (dysfunctional uterine bleeding) 07/22/2015  . Essential tremor 07/22/2015   left hand  . Genital warts   . GERD (gastroesophageal reflux disease)   . Hemorrhoids   . History of UTI   . Hyperglycemia    Pre-diabetic per pt!  . Hyperlipidemia   . Seasonal allergies 07/22/2015  . Vertigo    resolves with chiropratic; and sees Dr. Tamala Julian   Past Surgical History:  Procedure Laterality Date  . COLONOSCOPY    . GANGLION CYST EXCISION     right wrist  . HYSTEROSCOPY    . TONSILLECTOMY AND ADENOIDECTOMY  1971/1972  . UPPER GASTROINTESTINAL ENDOSCOPY    . WISDOM TOOTH EXTRACTION     Social History   Socioeconomic History  . Marital status: Single    Spouse name: Not on file  . Number of children: Not on file  . Years of education: Not on file  . Highest education level: Not on file  Occupational History  . Occupation: Patent attorney: [employer]  Tobacco Use  . Smoking status: Never Smoker  . Smokeless tobacco: Never Used  Substance and Sexual Activity  . Alcohol use: Yes    Alcohol/week: 0.0 standard drinks    Comment: Socially   . Drug  use: No  . Sexual activity: Not Currently  Other Topics Concern  . Not on file  Social History Narrative   Work or School: Hydrologist; Animal nutritionist    Lifestyle: no regular exercise; diet is pretty good - avoids gluten   Marital status/children/pets: Divorced   Safety:      -smoke alarm in the home:Yes     - wears seatbelt: Yes            Social Determinants of Health   Financial Resource Strain:   . Difficulty of Paying Living Expenses: Not on file  Food Insecurity:   . Worried About Charity fundraiser in the Last Year: Not on file  . Ran Out of Food in the Last Year: Not on file  Transportation Needs:   . Lack of Transportation (Medical): Not on file  . Lack  of Transportation (Non-Medical): Not on file  Physical Activity:   . Days of Exercise per Week: Not on file  . Minutes of Exercise per Session: Not on file  Stress:   . Feeling of Stress : Not on file  Social Connections:   . Frequency of Communication with Friends and Family: Not on file  . Frequency of Social Gatherings with Friends and Family: Not on file  . Attends Religious Services: Not on file  . Active Member of Clubs or Organizations: Not on file  . Attends Archivist Meetings: Not on file  . Marital Status: Not on file   Allergies  Allergen Reactions  . Benadryl [Diphenhydramine Hcl (Sleep)]     Dizzy, extremely drowsy, can cause a lot effects per pt!  . Other     Muscle relaxers-per patient she "cannot function"  . Aleve [Naproxen Sodium]     Heart palpitations - can take ibuprofen  . Flexeril [Cyclobenzaprine] Other (See Comments)    Makes her extremely tired  . Meloxicam     Made her depressed   Family History  Problem Relation Age of Onset  . Macular degeneration Mother   . Colon polyps Mother   . Heart disease Father   . Diabetes Father   . Hyperlipidemia Father   . Macular degeneration Maternal Grandmother   . Heart disease Maternal Grandmother   . Stroke Maternal Grandmother   . Heart disease Paternal Grandmother   . Heart attack Paternal Grandmother   . Heart disease Paternal Grandfather   . Heart attack Paternal Grandfather   . Colon cancer Neg Hx   . Esophageal cancer Neg Hx   . Stomach cancer Neg Hx   . Rectal cancer Neg Hx     Current Outpatient Medications (Endocrine & Metabolic):  Marland Kitchen  Dulaglutide (TRULICITY) [id number] SOPN, INJECT 0.75MG  INTO THE SKIN WEEKLY .  predniSONE (DELTASONE) 50 MG tablet, Take 1 tablet (50 mg total) by mouth daily.   Current Outpatient Medications (Cardiovascular):  .  rosuvastatin (CRESTOR) 5 MG tablet, Take 1 tablet (5 mg total) by mouth daily.   Current Outpatient Medications (Respiratory):  .   benzonatate (TESSALON PERLES) 100 MG capsule, Take 1 capsule (100 mg total) by mouth 3 (three) times daily as needed. .  fluticasone (FLONASE) 50 MCG/ACT nasal spray, Place 2 sprays into both nostrils daily.       Current Outpatient Medications (Other):  Marland Kitchen  Cholecalciferol (VITAMIN D PO), Take by mouth. .  doxycycline (VIBRA-TABS) 100 MG tablet, Take 1 tablet (100 mg total) by mouth 2 (two) times daily. .  Lactobacillus Rhamnosus,  GG, (CULTURELLE PO), Take by mouth daily. .  LUTEIN PO, Take by mouth. .  Multiple Vitamins-Minerals (MULTIVITAMIN ADULT PO), Take by mouth. .  Omega-3 Fatty Acids (FISH OIL PO), Take by mouth. Marland Kitchen  omeprazole (PRILOSEC) 40 MG capsule, TAKE 1 CAPSULE BY MOUTH EVERY DAY .  TURMERIC PO, Take 1 capsule by mouth daily.   Current Facility-Administered Medications (Other):  .  0.9 %  sodium chloride infusion   Reviewed prior external information including notes and imaging from  primary care provider As well as notes that were available from care everywhere and other healthcare systems.  Past medical history, social, surgical and family history all reviewed in electronic medical record.  No pertanent information unless stated regarding to the chief complaint.   Review of Systems:  No headache, visual changes, nausea, vomiting, diarrhea, constipation, dizziness, abdominal pain, skin rash, fevers, chills, night sweats, weight loss, swollen lymph nodes, body aches, joint swelling, chest pain, shortness of breath, mood changes. POSITIVE muscle aches  Objective  Blood pressure 122/82, pulse 81, height 5\' 4"  (1.626 m), weight 169 lb (76.7 kg), last menstrual period 01/11/2018, SpO2 99 %.   General: No apparent distress alert and oriented x3 mood and affect normal, dressed appropriately.  HEENT: Pupils equal, extraocular movements intact  Respiratory: Patient's speak in full sentences and does not appear short of breath  Cardiovascular: No lower extremity edema, non  tender, no erythema  Skin: Warm dry intact with no signs of infection or rash on extremities or on axial skeleton.  Abdomen: Soft nontender  Neuro: Cranial nerves II through XII are intact, neurovascularly intact in all extremities with 2+ DTRs and 2+ pulses.  Lymph: No lymphadenopathy of posterior or anterior cervical chain or axillae bilaterally.  Gait normal with good balance and coordination.  MSK:  Non tender with full range of motion and good stability and symmetric strength and tone of shoulders, elbows, wrist, hip, knee and ankles bilaterally.  Low back exam does have some tightness hip flexors bilaterally right greater than left.  Tender to palpation in the paraspinal musculature lumbar spine right greater than left.  Mild tightness with hamstring stretch on the right greater than left.  Deep tendon reflexes intact  Osteopathic findings  C6 flexed rotated and side bent left T7 extended rotated and side bent left L2 flexed rotated and side bent right Sacrum right on right    Impression and Recommendations:     This case required medical decision making of moderate complexity. The above documentation has been reviewed and is accurate and complete Lyndal Pulley, DO       Note: This dictation was prepared with Dragon dictation along with smaller phrase technology. Any transcriptional errors that result from this process are unintentional.

## 2019-03-25 NOTE — Patient Instructions (Addendum)
Xray today Keep working out Eat after swimming Get back into routine See me in 6-8 weeks

## 2019-03-25 NOTE — Assessment & Plan Note (Addendum)
Continues to have posturing.  Discussed icing regimen and home exercise, which I consider which wants to avoid.  Patient is to increase activity slowly over the course the next several weeks.  Patient will follow-up again in 4 to 8 weeks.  Chronic problem with exacerbation.  Social determinants of health include not being able to workout regularly secondary to different schedule at the moment.  Given different guidance recommendations.

## 2019-04-15 ENCOUNTER — Ambulatory Visit: Payer: BC Managed Care – PPO | Admitting: Family Medicine

## 2019-04-22 ENCOUNTER — Encounter: Payer: Self-pay | Admitting: Family Medicine

## 2019-04-22 ENCOUNTER — Ambulatory Visit: Payer: BC Managed Care – PPO | Admitting: Family Medicine

## 2019-04-22 ENCOUNTER — Other Ambulatory Visit: Payer: Self-pay

## 2019-04-22 VITALS — BP 130/84 | HR 74 | Temp 98.0°F | Resp 17 | Ht 64.0 in | Wt 169.0 lb

## 2019-04-22 DIAGNOSIS — E1169 Type 2 diabetes mellitus with other specified complication: Secondary | ICD-10-CM

## 2019-04-22 DIAGNOSIS — E663 Overweight: Secondary | ICD-10-CM | POA: Diagnosis not present

## 2019-04-22 DIAGNOSIS — E785 Hyperlipidemia, unspecified: Secondary | ICD-10-CM | POA: Diagnosis not present

## 2019-04-22 DIAGNOSIS — R1013 Epigastric pain: Secondary | ICD-10-CM

## 2019-04-22 LAB — POCT GLYCOSYLATED HEMOGLOBIN (HGB A1C)
HbA1c POC (<> result, manual entry): 5.6 % (ref 4.0–5.6)
HbA1c, POC (controlled diabetic range): 5.6 % (ref 0.0–7.0)
HbA1c, POC (prediabetic range): 5.6 % — AB (ref 5.7–6.4)
Hemoglobin A1C: 5.6 % (ref 4.0–5.6)

## 2019-04-22 MED ORDER — OMEPRAZOLE 40 MG PO CPDR: DELAYED_RELEASE_CAPSULE | ORAL | 1 refills | Status: DC

## 2019-04-22 MED ORDER — TRULICITY 0.75 MG/0.5ML ~~LOC~~ SOAJ: SUBCUTANEOUS | 5 refills | Status: DC

## 2019-04-22 NOTE — Patient Instructions (Addendum)
Great to see you today.  a1c is 5.6 today.  Make sure you are not having any hypoglycemic events. If you notice low sugars>> then try to take trulicity every 10 days instead of every 7 days.   Follow up in 4-6 months.    Hypoglycemia Hypoglycemia is when the sugar (glucose) level in your blood is too low. Signs of low blood sugar may include:  Feeling: ? Hungry. ? Worried or nervous (anxious). ? Sweaty and clammy. ? Confused. ? Dizzy. ? Sleepy. ? Sick to your stomach (nauseous).  Having: ? A fast heartbeat. ? A headache. ? A change in your vision. ? Tingling or no feeling (numbness) around your mouth, lips, or tongue. ? Jerky movements that you cannot control (seizure).  Having trouble with: ? Moving (coordination). ? Sleeping. ? Passing out (fainting). ? Getting upset easily (irritability). Low blood sugar can happen to people who have diabetes and people who do not have diabetes. Low blood sugar can happen quickly, and it can be an emergency. Treating low blood sugar Low blood sugar is often treated by eating or drinking something sugary right away, such as:  Fruit juice, 4-6 oz (120-150 mL).  Regular soda (not diet soda), 4-6 oz (120-150 mL).  Low-fat milk, 4 oz (120 mL).  Several pieces of hard candy.  Sugar or honey, 1 Tbsp (15 mL). Treating low blood sugar if you have diabetes If you can think clearly and swallow safely, follow the 15:15 rule:  Take 15 grams of a fast-acting carb (carbohydrate). Talk with your doctor about how much you should take.  Always keep a source of fast-acting carb with you, such as: ? Sugar tablets (glucose pills). Take 3-4 pills. ? 6-8 pieces of hard candy. ? 4-6 oz (120-150 mL) of fruit juice. ? 4-6 oz (120-150 mL) of regular (not diet) soda. ? 1 Tbsp (15 mL) honey or sugar.  Check your blood sugar 15 minutes after you take the carb.  If your blood sugar is still at or below 70 mg/dL (3.9 mmol/L), take 15 grams of a carb  again.  If your blood sugar does not go above 70 mg/dL (3.9 mmol/L) after 3 tries, get help right away.  After your blood sugar goes back to normal, eat a meal or a snack within 1 hour.  Treating very low blood sugar If your blood sugar is at or below 54 mg/dL (3 mmol/L), you have very low blood sugar (severe hypoglycemia). This may also cause:  Passing out.  Jerky movements you cannot control (seizure).  Losing consciousness (coma). This is an emergency. Do not wait to see if the symptoms will go away. Get medical help right away. Call your local emergency services (911 in the [place] Do not drive yourself to the hospital. If you have very low blood sugar and you cannot eat or drink, you may need a glucagon shot (injection). A family member or friend should learn how to check your blood sugar and how to give you a glucagon shot. Ask your doctor if you need to have a glucagon shot kit at home. Follow these instructions at home: General instructions  Take over-the-counter and prescription medicines only as told by your doctor.  Stay aware of your blood sugar as told by your doctor.  Limit alcohol intake to no more than 1 drink a day for nonpregnant women and 2 drinks a day for men. One drink equals 12 oz of beer (355 mL), 5 oz of wine (148 mL),  or 1 oz of hard liquor (44 mL).  Keep all follow-up visits as told by your doctor. This is important. If you have diabetes:   Follow your diabetes care plan as told by your doctor. Make sure you: ? Know the signs of low blood sugar. ? Take your medicines as told. ? Follow your exercise and meal plan. ? Eat on time. Do not skip meals. ? Check your blood sugar as often as told by your doctor. Always check it before and after exercise. ? Follow your sick day plan when you cannot eat or drink normally. Make this plan ahead of time with your doctor.  Share your diabetes care plan with: ? Your work or school. ? People you live with.  Check  your pee (urine) for ketones: ? When you are sick. ? As told by your doctor.  Carry a card or wear jewelry that says you have diabetes. Contact a doctor if:  You have trouble keeping your blood sugar in your target range.  You have low blood sugar often. Get help right away if:  You still have symptoms after you eat or drink something sugary.  Your blood sugar is at or below 54 mg/dL (3 mmol/L).  You have jerky movements that you cannot control.  You pass out. These symptoms may be an emergency. Do not wait to see if the symptoms will go away. Get medical help right away. Call your local emergency services (911 in the [place] Do not drive yourself to the hospital. Summary  Hypoglycemia happens when the level of sugar (glucose) in your blood is too low.  Low blood sugar can happen to people who have diabetes and people who do not have diabetes. Low blood sugar can happen quickly, and it can be an emergency.  Make sure you know the signs of low blood sugar and know how to treat it.  Always keep a source of sugar (fast-acting carb) with you to treat low blood sugar. This information is not intended to replace advice given to you by your health care provider. Make sure you discuss any questions you have with your health care provider. Document Revised: 05/02/2018 Document Reviewed: 02/12/2015 Elsevier Patient Education  2020 [employer].

## 2019-04-22 NOTE — Progress Notes (Addendum)
Patient ID: Kendra Austin, female  DOB: 01/10/1966, 54 y.o.   MRN: [id number] Patient Care Team    Relationship Specialty Notifications Start End  Ma Hillock, DO PCP - General Family Medicine  12/16/18   Doran Stabler, MD Consulting Physician Gastroenterology  12/16/18   Cheri Fowler, MD Consulting Physician Obstetrics and Gynecology  12/16/18     Chief Complaint  Patient presents with  . Diabetes    Fasting. Pt has not been checking sugars at home.     Subjective:  Kendra Austin is a 54 y.o.  female present for [facility] follow up  Type 2 diabetes mellitus with other specified complication, without long-term current use of insulin ([place]) Pt reports complaince with Trulicuty. She started this particular medication summer 2020. She was taking metformin prior with stomach upset.  Patient denies dizziness, hyperglycemic or hypoglycemic events. Patient denies numbness, tingling in the extremities or nonhealing wounds of feet.  PNA series: PNA 23 UTD Flu shot: UTD 2020 (recommneded yearly) BMP: WNL 10/2018 Foot exam: completed today - 12/16/2018 Eye exam: 12/2018 UTD.  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  A1c: 5.8 >5.6 today   Hyperlipidemia associated with type 2 diabetes mellitus (HCC) Tolerating statin. Labs UTD 06/2018.  Dyspepsia Taking omprazole 40 mg QD. Has been unable to taper down.   Depression screen [facility] 2/9 07/01/2018 03/01/2017  Decreased Interest 0 0  Down, Depressed, Hopeless 0 0  PHQ - 2 Score 0 0  Altered sleeping 0 -  Tired, decreased energy 0 -  Change in appetite 0 -  Feeling bad or failure about yourself  0 -  Trouble concentrating 0 -  Moving slowly or fidgety/restless 0 -  Suicidal thoughts 0 -  PHQ-9 Score 0 -  Difficult doing work/chores Not difficult at all -   No flowsheet data found.      Fall Risk  07/29/2018 07/01/2018 05/08/2018  Falls in the past year? 0 0 1  Number falls in past yr: 0 0 0  Injury with Fall? 0 0 1  Comment - - sprain  ankle      Immunization History  Administered Date(s) Administered  . Influenza,inj,Quad PF,6+ Mos 11/07/2018  . Influenza-Unspecified 10/24/2015, 10/23/2016  . PFIZER SARS-COV-2 Vaccination 04/06/2019, 04/18/2019  . Pneumococcal Polysaccharide-23 12/16/2018  . Tdap 01/24/2011    No exam data present  Past Medical History:  Diagnosis Date  . Allergy   . Arthritis    osteoarthritis; neck, R shoulder  . Atypical chest pain    has had prior cardiac eval in the past/ started 4 years ago/ corrected by back adjustmment  . Chicken pox   . Complication of anesthesia    Per pt, sensitive to sedation!  . Depression    related to stress from prior job; no medications, hospitalizations or SI  . Diabetes mellitus without complication ([place])   . DUB (dysfunctional uterine bleeding) 07/22/2015  . Essential tremor 07/22/2015   left hand  . Genital warts   . GERD (gastroesophageal reflux disease)   . Hemorrhoids   . History of UTI   . Hyperglycemia    Pre-diabetic per pt!  . Hyperlipidemia   . Seasonal allergies 07/22/2015  . Vertigo    resolves with chiropratic; and sees Dr. Tamala Julian   Allergies  Allergen Reactions  . Benadryl [Diphenhydramine Hcl (Sleep)]     Dizzy, extremely drowsy, can cause a lot effects per pt!  . Other     Muscle relaxers-per  patient she "cannot function"  . Aleve [Naproxen Sodium]     Heart palpitations - can take ibuprofen  . Flexeril [Cyclobenzaprine] Other (See Comments)    Makes her extremely tired  . Meloxicam     Made her depressed   Past Surgical History:  Procedure Laterality Date  . COLONOSCOPY    . GANGLION CYST EXCISION     right wrist  . HYSTEROSCOPY    . TONSILLECTOMY AND ADENOIDECTOMY  1971/1972  . UPPER GASTROINTESTINAL ENDOSCOPY    . WISDOM TOOTH EXTRACTION     Family History  Problem Relation Age of Onset  . Macular degeneration Mother   . Colon polyps Mother   . Heart disease Father   . Diabetes Father   . Hyperlipidemia  Father   . Macular degeneration Maternal Grandmother   . Heart disease Maternal Grandmother   . Stroke Maternal Grandmother   . Heart disease Paternal Grandmother   . Heart attack Paternal Grandmother   . Heart disease Paternal Grandfather   . Heart attack Paternal Grandfather   . Colon cancer Neg Hx   . Esophageal cancer Neg Hx   . Stomach cancer Neg Hx   . Rectal cancer Neg Hx    Social History   Social History Narrative   Work or School: Hydrologist; Animal nutritionist    Lifestyle: no regular exercise; diet is pretty good - avoids gluten   Marital status/children/pets: Divorced   Safety:      -smoke alarm in the home:Yes     - wears seatbelt: Yes             Allergies as of 04/22/2019      Reactions   Benadryl [diphenhydramine Hcl (sleep)]    Dizzy, extremely drowsy, can cause a lot effects per pt!   Other    Muscle relaxers-per patient she "cannot function"   Aleve [naproxen Sodium]    Heart palpitations - can take ibuprofen   Flexeril [cyclobenzaprine] Other (See Comments)   Makes her extremely tired   Meloxicam    Made her depressed      Medication List       Accurate as of April 22, 2019  8:41 AM. If you have any questions, ask your nurse or doctor.        STOP taking these medications   benzonatate 100 MG capsule Commonly known as: Best boy Stopped by: Howard Pouch, DO   doxycycline 100 MG tablet Commonly known as: VIBRA-TABS Stopped by: Howard Pouch, DO   predniSONE 50 MG tablet Commonly known as: DELTASONE Stopped by: Howard Pouch, DO     TAKE these medications   CULTURELLE PO Take by mouth daily.   FISH OIL PO Take by mouth.   fluticasone 50 MCG/ACT nasal spray Commonly known as: FLONASE Place 2 sprays into both nostrils daily.   LUTEIN PO Take by mouth.   MULTIVITAMIN ADULT PO Take by mouth.   omeprazole 40 MG capsule Commonly known as: PRILOSEC TAKE 1 CAPSULE BY MOUTH EVERY DAY   rosuvastatin 5 MG  tablet Commonly known as: Crestor Take 1 tablet (5 mg total) by mouth daily.   Trulicity [id number] [id number] Sopn Generic drug: Dulaglutide INJECT 0.75MG  INTO THE SKIN WEEKLY   TURMERIC PO Take 1 capsule by mouth daily.   VITAMIN D PO Take by mouth.       All past medical history, surgical history, allergies, family history, immunizations andmedications were updated in the EMR today and reviewed under the history  and medication portions of their EMR.      ROS: 14 pt review of systems performed and negative (unless mentioned in an HPI)  Objective: BP 130/84 (BP Location: Left Arm, Patient Position: Sitting, Cuff Size: Normal)   Pulse 74   Temp 98 F (36.7 C) (Temporal)   Resp 17   Ht 5\' 4"  (1.626 m)   Wt 169 lb (76.7 kg)   LMP 01/11/2018   SpO2 100%   BMI 29.01 kg/m  Gen: Afebrile. No acute distress. Nontoxic female.  HENT: AT. [place].  Eyes:Pupils Equal Round Reactive to light, Extraocular movements intact,  Conjunctiva without redness, discharge or icterus. CV: RRR no murmur, no edema, +2/4 P posterior tibialis pulses Chest: CTAB, no wheeze or crackles Abd: Soft. NTND. BS present. Skin: no rashes, purpura or petechiae.  Neuro:  Normal gait. PERLA. EOMi. Alert. Oriented x3 Psych: Normal affect, dress and demeanor. Normal speech. Normal thought content and judgment.  Assessment/plan: Kendra Austin is a 54 y.o. female present for TOC/CMC Type 2 diabetes mellitus with other specified complication, without long-term current use of insulin ([place]) Stable. Doing well on trulicity once weekly. I encouraged her to monitor for any signs of low sugar. 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  A1c: 5.8> 5.6 today  Hyperlipidemia associated  with type 2 diabetes mellitus (HCC) Tolerating statin. Lab due 06/2019  Dyspepsia Continue omprazole 40 mg QD. Has been unable to taper down.    No follow-ups on file.  Orders Placed This Encounter  Procedures  . POCT glycosylated hemoglobin (Hb A1C)   No orders of the defined types were placed in this encounter.  Referral Orders  No referral(s) requested today      This visit occurred during the SARS-CoV-2 public health emergency.  Safety protocols were in place, including screening questions prior to the visit, additional usage of staff PPE, and extensive cleaning of exam room while observing appropriate contact time as indicated for disinfecting solutions.    Note is dictated utilizing voice recognition software. Although note has been proof read prior to signing, occasional typographical errors still can be missed. If any questions arise, please do not hesitate to call for verification.  Electronically signed by: Howard Pouch, DO [place]

## 2019-05-13 ENCOUNTER — Encounter: Payer: Self-pay | Admitting: Family Medicine

## 2019-05-13 ENCOUNTER — Other Ambulatory Visit: Payer: Self-pay

## 2019-05-13 ENCOUNTER — Ambulatory Visit: Payer: BC Managed Care – PPO | Admitting: Family Medicine

## 2019-05-13 VITALS — BP 120/60 | HR 77 | Ht 64.0 in | Wt 173.0 lb

## 2019-05-13 DIAGNOSIS — R293 Abnormal posture: Secondary | ICD-10-CM

## 2019-05-13 DIAGNOSIS — M999 Biomechanical lesion, unspecified: Secondary | ICD-10-CM | POA: Diagnosis not present

## 2019-05-13 NOTE — Assessment & Plan Note (Signed)
   Decision today to treat with OMT was based on Physical Exam  After verbal consent patient was treated with HVLA, ME, FPR techniques in cervical, thoracic,  lumbar and sacral areas, all areas are chronic   Patient tolerated the procedure well with improvement in symptoms  Patient given exercises, stretches and lifestyle modifications  See medications in patient instructions if given  Patient will follow up in 4-8 weeks 

## 2019-05-13 NOTE — Assessment & Plan Note (Signed)
Chronic problem with exacerbation.  Patient social determinants of health anxious and having no time to do the workout regularly secondary to her job.  Patient is to watch on the posture.  Responds well to me duration.  We discussed medications amatory for short course.  Follow-up again 4 to 8 weeks

## 2019-05-13 NOTE — Patient Instructions (Signed)
Good to see you L glutamine 5 grams daily Murelax and colace daily See me again in 5-6 weeks

## 2019-05-13 NOTE — Progress Notes (Signed)
[place] [address] [place] Phone: [phone] Subjective:   I Kendra Austin am serving as a Education administrator for Dr. Hulan Austin.  This visit occurred during the SARS-CoV-2 public health emergency.  Safety protocols were in place, including screening questions prior to the visit, additional usage of staff PPE, and extensive cleaning of exam room while observing appropriate contact time as indicated for disinfecting solutions.   I'm seeing this patient by the request  of:  Austin, Kendra A, DO  CC: Low back neck pain follow-up  [id number]  Kendra Austin is a 54 y.o. female coming in with complaint of back pain. Last seen on 03/25/2019 for OMT. Patient states she is doing alright. Pain comes and goes.  Dull, throbbing aching sensation.  More stress recently the patient thinks is contributing.  Patient's has been doing the exercises only intermittently and usually does it more regular.     Past Medical History:  Diagnosis Date  . Allergy   . Arthritis    osteoarthritis; neck, R shoulder  . Atypical chest pain    has had prior cardiac eval in the past/ started 4 years ago/ corrected by back adjustmment  . Chicken pox   . Complication of anesthesia    Per pt, sensitive to sedation!  . Depression    related to stress from prior job; no medications, hospitalizations or SI  . Diabetes mellitus without complication ([place])   . DUB (dysfunctional uterine bleeding) 07/22/2015  . Essential tremor 07/22/2015   left hand  . Genital warts   . GERD (gastroesophageal reflux disease)   . Hemorrhoids   . History of UTI   . Hyperglycemia    Pre-diabetic per pt!  . Hyperlipidemia   . Seasonal allergies 07/22/2015  . Vertigo    resolves with chiropratic; and sees Dr. Tamala Julian   Past Surgical History:  Procedure Laterality Date  . COLONOSCOPY    . GANGLION CYST EXCISION     right wrist  . HYSTEROSCOPY    . TONSILLECTOMY AND ADENOIDECTOMY   1971/1972  . UPPER GASTROINTESTINAL ENDOSCOPY    . WISDOM TOOTH EXTRACTION     Social History   Socioeconomic History  . Marital status: Single    Spouse name: Not on file  . Number of children: Not on file  . Years of education: Not on file  . Highest education level: Not on file  Occupational History  . Occupation: Patent attorney: [employer]  Tobacco Use  . Smoking status: Never Smoker  . Smokeless tobacco: Never Used  Substance and Sexual Activity  . Alcohol use: Yes    Alcohol/week: 0.0 standard drinks    Comment: Socially   . Drug use: No  . Sexual activity: Not Currently  Other Topics Concern  . Not on file  Social History Narrative   Work or School: Hydrologist; Animal nutritionist    Lifestyle: no regular exercise; diet is pretty good - avoids gluten   Marital status/children/pets: Divorced   Safety:      -smoke alarm in the home:Yes     - wears seatbelt: Yes            Social Determinants of Health   Financial Resource Strain:   . Difficulty of Paying Living Expenses:   Food Insecurity:   . Worried About Charity fundraiser in the Last Year:   . [place] in the Last Year:  Transportation Needs:   . Film/video editor (Medical):   Marland Kitchen Lack of Transportation (Non-Medical):   Physical Activity:   . Days of Exercise per Week:   . Minutes of Exercise per Session:   Stress:   . Feeling of Stress :   Social Connections:   . Frequency of Communication with Friends and Family:   . Frequency of Social Gatherings with Friends and Family:   . Attends Religious Services:   . Active Member of Clubs or Organizations:   . Attends Archivist Meetings:   Marland Kitchen Marital Status:    Allergies  Allergen Reactions  . Benadryl [Diphenhydramine Hcl (Sleep)]     Dizzy, extremely drowsy, can cause a lot effects per pt!  . Other     Muscle relaxers-per patient she "cannot function"  . Aleve [Naproxen Sodium]     Heart palpitations -  can take ibuprofen  . Flexeril [Cyclobenzaprine] Other (See Comments)    Makes her extremely tired  . Meloxicam     Made her depressed   Family History  Problem Relation Age of Onset  . Macular degeneration Mother   . Colon polyps Mother   . Heart disease Father   . Diabetes Father   . Hyperlipidemia Father   . Macular degeneration Maternal Grandmother   . Heart disease Maternal Grandmother   . Stroke Maternal Grandmother   . Heart disease Paternal Grandmother   . Heart attack Paternal Grandmother   . Heart disease Paternal Grandfather   . Heart attack Paternal Grandfather   . Colon cancer Neg Hx   . Esophageal cancer Neg Hx   . Stomach cancer Neg Hx   . Rectal cancer Neg Hx     Current Outpatient Medications (Endocrine & Metabolic):  Marland Kitchen  Dulaglutide (TRULICITY) [id number] SOPN, INJECT 0.75MG  INTO THE SKIN WEEKLY   Current Outpatient Medications (Cardiovascular):  .  rosuvastatin (CRESTOR) 5 MG tablet, Take 1 tablet (5 mg total) by mouth daily.   Current Outpatient Medications (Respiratory):  .  fluticasone (FLONASE) 50 MCG/ACT nasal spray, Place 2 sprays into both nostrils daily.       Current Outpatient Medications (Other):  Marland Kitchen  Cholecalciferol (VITAMIN D PO), Take by mouth. .  Lactobacillus Rhamnosus, GG, (CULTURELLE PO), Take by mouth daily. .  LUTEIN PO, Take by mouth. .  Multiple Vitamins-Minerals (MULTIVITAMIN ADULT PO), Take by mouth. .  Omega-3 Fatty Acids (FISH OIL PO), Take by mouth. Marland Kitchen  omeprazole (PRILOSEC) 40 MG capsule, TAKE 1 CAPSULE BY MOUTH EVERY DAY .  TURMERIC PO, Take 1 capsule by mouth daily.   Current Facility-Administered Medications (Other):  .  0.9 %  sodium chloride infusion   Reviewed prior external information including notes and imaging from  primary care provider As well as notes that were available from care everywhere and other healthcare systems.  Past medical history, social, surgical and family history all reviewed in  electronic medical record.  No pertanent information unless stated regarding to the chief complaint.   Review of Systems:  No headache, visual changes, nausea, vomiting, diarrhea, constipation, dizziness, abdominal pain, skin rash, fevers, chills, night sweats, weight loss, swollen lymph nodes, body aches, joint swelling, chest pain, shortness of breath, mood changes. POSITIVE muscle aches  Objective  Blood pressure 120/60, pulse 77, height 5\' 4"  (1.626 m), weight 173 lb (78.5 kg), last menstrual period 01/11/2018, SpO2 98 %.   General: No apparent distress alert and oriented x3 mood and affect normal, dressed appropriately.  HEENT:  Pupils equal, extraocular movements intact  Respiratory: Patient's speak in full sentences and does not appear short of breath  Cardiovascular: No lower extremity edema, non tender, no erythema  Neuro: Cranial nerves II through XII are intact, neurovascularly intact in all extremities with 2+ DTRs and 2+ pulses.  Gait normal with good balance and coordination.  MSK:  Non tender with full range of motion and good stability and symmetric strength and tone of shoulders, elbows, wrist, hip, knee and ankles bilaterally.  Loss of lordosis of the scapula.  Patient does have some mild tenderness to palpation diffusely.  Patient has negative Spurling's of the neck.  Patient does have mild increase in tightness of the FABER test noted.  Osteopathic findings C6 flexed rotated and side bent left T8 extended rotated and side bent left L2 flexed rotated and side bent right Sacrum right on right    Impression and Recommendations:     This case required medical decision making of moderate complexity. The above documentation has been reviewed and is accurate and complete Lyndal Pulley, DO       Note: This dictation was prepared with Dragon dictation along with smaller phrase technology. Any transcriptional errors that result from this process are unintentional.

## 2019-06-24 ENCOUNTER — Encounter: Payer: Self-pay | Admitting: Family Medicine

## 2019-06-24 ENCOUNTER — Ambulatory Visit: Payer: BC Managed Care – PPO | Admitting: Family Medicine

## 2019-06-24 ENCOUNTER — Other Ambulatory Visit: Payer: Self-pay

## 2019-06-24 VITALS — BP 112/74 | HR 68 | Ht 64.0 in | Wt 172.0 lb

## 2019-06-24 DIAGNOSIS — M999 Biomechanical lesion, unspecified: Secondary | ICD-10-CM

## 2019-06-24 DIAGNOSIS — R293 Abnormal posture: Secondary | ICD-10-CM | POA: Diagnosis not present

## 2019-06-24 DIAGNOSIS — F411 Generalized anxiety disorder: Secondary | ICD-10-CM

## 2019-06-24 NOTE — Patient Instructions (Addendum)
Good to see you  Behavioral health [place]  Find time for yourself Keep working out See me again in 4-6 weeks

## 2019-06-24 NOTE — Assessment & Plan Note (Signed)
No suicidal or homicidal ideation.  Patient does have a aging mother and [place] and is considering moving.  Encouraged her to talk to her primary care physician but at the same time will refer to behavioral health.  Follow-up with me as well in 4 to 6 weeks

## 2019-06-24 NOTE — Progress Notes (Signed)
[facility] [address] [place] Phone: [phone] Subjective:   I Kendra Austin am serving as [place] Education administrator for Dr. Hulan Saas.  This visit occurred during the SARS-CoV-2 public health emergency.  Safety protocols were in place, including screening questions prior to the visit, additional usage of staff PPE, and extensive cleaning of exam room while observing appropriate contact time as indicated for disinfecting solutions.   I'm seeing this patient by the request  of:  Kuneff, Renee A, DO  CC: Neck pain, neck pain follow-up  [id number]  Kendra Austin is [place] 54 y.o. female coming in with complaint of back and neck pain Patient states she hurts today but it is "her fault". Pushed her new mattress up the stairs.   Medications patient has been prescribed: Patient is taking some mild ibuprofen.  Taking: [place] intermittent ibuprofen     Patient is having some difficulty focusing on herself because she does have [place] aging mother in [place] that she may have to move down to.  Patient is also having some mild stress at work.    Reviewed prior external information including notes and imaging from previsou exam, outside providers and external EMR if available.   As well as notes that were available from care everywhere and other healthcare systems.  Past medical history, social, surgical and family history all reviewed in electronic medical record.  No pertanent information unless stated regarding to the chief complaint.   Past Medical History:  Diagnosis Date  . Allergy   . Arthritis    osteoarthritis; neck, R shoulder  . Atypical chest pain    has had prior cardiac eval in the past/ started 4 years ago/ corrected by back adjustmment  . Chicken pox   . Complication of anesthesia    Per pt, sensitive to sedation!  . Depression    related to stress from prior job; no medications, hospitalizations or SI  . Diabetes mellitus without  complication ([place])   . DUB (dysfunctional uterine bleeding) 07/22/2015  . Essential tremor 07/22/2015   left hand  . Genital warts   . GERD (gastroesophageal reflux disease)   . Hemorrhoids   . History of UTI   . Hyperglycemia    Pre-diabetic per pt!  . Hyperlipidemia   . Seasonal allergies 07/22/2015  . Vertigo    resolves with chiropratic; and sees Dr. Tamala Julian    Allergies  Allergen Reactions  . Benadryl [Diphenhydramine Hcl (Sleep)]     Dizzy, extremely drowsy, can cause [place] lot effects per pt!  . Other     Muscle relaxers-per patient she "cannot function"  . Aleve [Naproxen Sodium]     Heart palpitations - can take ibuprofen  . Flexeril [Cyclobenzaprine] Other (See Comments)    Makes her extremely tired  . Meloxicam     Made her depressed     Review of Systems:  No headache, visual changes, nausea, vomiting, diarrhea, constipation, dizziness, abdominal pain, skin rash, fevers, chills, night sweats, weight loss, swollen lymph nodes, body aches, joint swelling, chest pain, shortness of breath, . POSITIVE muscle aches mild increase in anxiety  Objective  Last menstrual period 01/11/2018.   General: No apparent distress alert and oriented x3 mood and affect normal, dressed appropriately.  HEENT: Pupils equal, extraocular movements intact  Respiratory: Patient's speak in full sentences and does not appear short of breath  Cardiovascular: No lower extremity edema, non tender, no erythema  Neuro: Cranial nerves II through XII  are intact, neurovascularly intact in all extremities with 2+ DTRs and 2+ pulses.  Gait normal with good balance and coordination.  MSK:  Non tender with full range of motion and good stability and symmetric strength and tone of shoulders, elbows, wrist, hip, knee and ankles bilaterally.  Back -back exam mild tightness noted.  Tender to palpation.  Mild tightness in the straight leg test more than usual.  Osteopathic findings  C2 flexed rotated and side  bent right C7 flexed rotated and side bent left T3 extended rotated and side bent right inhaled rib T9 extended rotated and side bent left L2 flexed rotated and side bent right Sacrum right on right       Assessment and Plan:     Decision today to treat with OMT was based on Physical Exam  After verbal consent patient was treated with HVLA, ME, FPR techniques in cervical, rib, thoracic, lumbar, and sacral  areas  Patient tolerated the procedure well with improvement in symptoms  Patient given exercises, stretches and lifestyle modifications  See medications in patient instructions if given  Patient will follow up in 4-8 weeks      The above documentation has been reviewed and is accurate and complete Lyndal Pulley, DO       Note: This dictation was prepared with Dragon dictation along with smaller phrase technology. Any transcriptional errors that result from this process are unintentional.

## 2019-06-24 NOTE — Assessment & Plan Note (Addendum)
   Decision today to treat with OMT was based on Physical Exam  After verbal consent patient was treated with HVLA, ME, FPR techniques in cervical, thoracic,  lumbar and sacral areas, all areas are chronic   Patient tolerated the procedure well with improvement in symptoms  Patient given exercises, stretches and lifestyle modifications  See medications in patient instructions if given  Patient will follow up in 4-8 weeks 

## 2019-06-24 NOTE — Assessment & Plan Note (Signed)
Postural imbalances.  Discussed icing regimen and home exercise, which activities to do which wants to avoid.  Patient does have some increasing stress that I think is contributing as well.  Patient will be referred to behavioral health.  Increase activity slowly.  Follow-up again in 4 to 8 weeks

## 2019-08-06 ENCOUNTER — Ambulatory Visit (INDEPENDENT_AMBULATORY_CARE_PROVIDER_SITE_OTHER): Payer: BC Managed Care – PPO | Admitting: Psychology

## 2019-08-06 ENCOUNTER — Other Ambulatory Visit: Payer: Self-pay

## 2019-08-06 ENCOUNTER — Ambulatory Visit: Payer: BC Managed Care – PPO | Admitting: Family Medicine

## 2019-08-06 ENCOUNTER — Encounter: Payer: Self-pay | Admitting: Family Medicine

## 2019-08-06 VITALS — BP 110/68 | HR 75 | Ht 64.0 in | Wt 170.0 lb

## 2019-08-06 DIAGNOSIS — M999 Biomechanical lesion, unspecified: Secondary | ICD-10-CM | POA: Diagnosis not present

## 2019-08-06 DIAGNOSIS — R293 Abnormal posture: Secondary | ICD-10-CM | POA: Diagnosis not present

## 2019-08-06 DIAGNOSIS — F419 Anxiety disorder, unspecified: Secondary | ICD-10-CM | POA: Diagnosis not present

## 2019-08-06 NOTE — Progress Notes (Signed)
[place] Phone: [phone] Subjective:   Kendra Austin, am serving as a scribe for Dr. Hulan Saas. This visit occurred during the SARS-CoV-2 public health emergency.  Safety protocols were in place, including screening questions prior to the visit, additional usage of staff PPE, and extensive cleaning of exam room while observing appropriate contact time as indicated for disinfecting solutions.   I'm seeing this patient by the request  of:  Kuneff, Renee A, DO  CC: Back and neck pain follow-up  EGB:[id number]  Kendra Austin is a 54 y.o. female coming in with complaint of back and neck pain. OMT 06/24/2019. Patient states that she has been doing well. Needs adjustment as she recently moved some furniture.    Medications patient has been prescribed: Only taking over-the-counter medications with occasional ibuprofen          Reviewed prior external information including notes and imaging from previsou exam, outside providers and external EMR if available.   As well as notes that were available from care everywhere and other healthcare systems.  Past medical history, social, surgical and family history all reviewed in electronic medical record.  Austin pertanent information unless stated regarding to the chief complaint.   Past Medical History:  Diagnosis Date  . Allergy   . Arthritis    osteoarthritis; neck, R shoulder  . Atypical chest pain    has had prior cardiac eval in the past/ started 4 years ago/ corrected by back adjustmment  . Chicken pox   . Complication of anesthesia    Per pt, sensitive to sedation!  . Depression    related to stress from prior job; Austin medications, hospitalizations or SI  . Diabetes mellitus without complication ([place])   . DUB (dysfunctional uterine bleeding) 07/22/2015  . Essential tremor 07/22/2015   left hand  . Genital warts   . GERD (gastroesophageal reflux disease)     . Hemorrhoids   . History of UTI   . Hyperglycemia    Pre-diabetic per pt!  . Hyperlipidemia   . Seasonal allergies 07/22/2015  . Vertigo    resolves with chiropratic; and sees Dr. Tamala Julian    Allergies  Allergen Reactions  . Benadryl [Diphenhydramine Hcl (Sleep)]     Dizzy, extremely drowsy, can cause a lot effects per pt!  . Other     Muscle relaxers-per patient she "cannot function"  . Aleve [Naproxen Sodium]     Heart palpitations - can take ibuprofen  . Flexeril [Cyclobenzaprine] Other (See Comments)    Makes her extremely tired  . Meloxicam     Made her depressed     Review of Systems:  Austin headache, visual changes, nausea, vomiting, diarrhea, constipation, dizziness, abdominal pain, skin rash, fevers, chills, night sweats, weight loss, swollen lymph nodes, body aches, joint swelling, chest pain, shortness of breath, mood changes. POSITIVE muscle aches  Objective  Blood pressure 110/68, pulse 75, height 5\' 4"  (1.626 m), weight 170 lb (77.1 kg), last menstrual period 01/11/2018, SpO2 98 %.   General: Austin apparent distress alert and oriented x3 mood and affect normal, dressed appropriately.  HEENT: Pupils equal, extraocular movements intact  Respiratory: Patient's speak in full sentences and does not appear short of breath  Cardiovascular: Austin lower extremity edema, non tender, Austin erythema  Neuro: Cranial nerves II through XII are intact, neurovascularly intact in all extremities with 2+ DTRs and 2+ pulses.  Gait normal with good balance and  coordination.  MSK:  Non tender with full range of motion and good stability and symmetric strength and tone of shoulders, elbows, wrist, hip, knee and ankles bilaterally.  Back -increasing discomfort over the right parascapular region when compared to previous exam.  Some mild loss of range of motion of the neck in flexion extension of 5 degrees.  Negative Spurling's though.  Low back exam more tightness in the thoracolumbar  juncture  Osteopathic findings  C5 flexed rotated and side bent right T3 extended rotated and side bent right inhaled rib T8 extended rotated and side bent left L2 flexed rotated and side bent right Sacrum right on right       Assessment and Plan:    Nonallopathic problems  Decision today to treat with OMT was based on Physical Exam  After verbal consent patient was treated with HVLA, ME, FPR techniques in cervical, rib, thoracic, lumbar, and sacral  areas  Patient tolerated the procedure well with improvement in symptoms  Patient given exercises, stretches and lifestyle modifications  See medications in patient instructions if given  Patient will follow up in 4-8 weeks      The above documentation has been reviewed and is accurate and complete Kendra Pulley, DO       Note: This dictation was prepared with Dragon dictation along with smaller phrase technology. Any transcriptional errors that result from this process are unintentional.

## 2019-08-06 NOTE — Assessment & Plan Note (Addendum)
Chronic problem with mild exacerbation.  Discussed ibuprofen.  Discussed home exercises.  Discussed HEP, Discussed ergonomics at work.  Follow-up again in 4 to 8 weeks

## 2019-08-06 NOTE — Patient Instructions (Signed)
Triceps three distinct positions See me in 7-8weeks

## 2019-08-13 ENCOUNTER — Ambulatory Visit (INDEPENDENT_AMBULATORY_CARE_PROVIDER_SITE_OTHER): Payer: BC Managed Care – PPO | Admitting: Psychology

## 2019-08-13 DIAGNOSIS — F419 Anxiety disorder, unspecified: Secondary | ICD-10-CM

## 2019-08-19 ENCOUNTER — Ambulatory Visit (INDEPENDENT_AMBULATORY_CARE_PROVIDER_SITE_OTHER): Payer: BC Managed Care – PPO | Admitting: Psychology

## 2019-08-19 DIAGNOSIS — F419 Anxiety disorder, unspecified: Secondary | ICD-10-CM

## 2019-08-22 ENCOUNTER — Other Ambulatory Visit: Payer: Self-pay

## 2019-08-22 ENCOUNTER — Encounter: Payer: Self-pay | Admitting: Family Medicine

## 2019-08-22 ENCOUNTER — Ambulatory Visit: Payer: BC Managed Care – PPO | Admitting: Family Medicine

## 2019-08-22 VITALS — BP 108/75 | HR 64 | Temp 98.6°F | Resp 17 | Ht 64.0 in | Wt 168.2 lb

## 2019-08-22 DIAGNOSIS — E663 Overweight: Secondary | ICD-10-CM | POA: Diagnosis not present

## 2019-08-22 DIAGNOSIS — E785 Hyperlipidemia, unspecified: Secondary | ICD-10-CM | POA: Diagnosis not present

## 2019-08-22 DIAGNOSIS — E1169 Type 2 diabetes mellitus with other specified complication: Secondary | ICD-10-CM

## 2019-08-22 DIAGNOSIS — R1013 Epigastric pain: Secondary | ICD-10-CM | POA: Diagnosis not present

## 2019-08-22 LAB — POCT GLYCOSYLATED HEMOGLOBIN (HGB A1C)
HbA1c POC (<> result, manual entry): 5.5 % (ref 4.0–5.6)
HbA1c, POC (controlled diabetic range): 5.5 % (ref 0.0–7.0)
HbA1c, POC (prediabetic range): 5.5 % — AB (ref 5.7–6.4)
Hemoglobin A1C: 5.5 % (ref 4.0–5.6)

## 2019-08-22 LAB — LIPID PANEL
Cholesterol: 147 mg/dL (ref 0–200)
HDL: 63.5 mg/dL (ref >39.00)
LDL Cholesterol: 64 mg/dL (ref 0–99)
NonHDL: 83.17
Total CHOL/HDL Ratio: 2
Triglycerides: 96 mg/dL (ref 0.0–149.0)
VLDL: 19.2 mg/dL (ref 0.0–40.0)

## 2019-08-22 LAB — TSH: TSH: 2.46 u[IU]/mL (ref 0.35–4.50)

## 2019-08-22 MED ORDER — TRULICITY 0.75 MG/0.5ML ~~LOC~~ SOAJ: SUBCUTANEOUS | 5 refills | Status: DC

## 2019-08-22 MED ORDER — OMEPRAZOLE 40 MG PO CPDR: DELAYED_RELEASE_CAPSULE | ORAL | 1 refills | Status: DC

## 2019-08-22 MED ORDER — ROSUVASTATIN CALCIUM 5 MG PO TABS: 5.0000 mg | ORAL_TABLET | Freq: Every day | ORAL | 3 refills | Status: DC

## 2019-08-22 NOTE — Patient Instructions (Addendum)
Your a1c is 5.5 today. Continue every 10 days with trulicity.  Next appt: 5.5 months.   Acupuncture :  [facility] Address: [address] [place], [place] [address] Hours:  Phone: [phone]

## 2019-08-22 NOTE — Progress Notes (Signed)
Patient ID: [name], female  DOB: 05-14-1965, 54 y.o.   MRN: [id number] Patient Care Team    Relationship Specialty Notifications Start End  Kendra Hillock, DO PCP - General Family Medicine  12/16/18   Kendra Stabler, MD Consulting Physician Gastroenterology  12/16/18   Kendra Fowler, MD Consulting Physician Obstetrics and Gynecology  12/16/18     Chief Complaint  Patient presents with  . Diabetes    Needs refills.     Subjective: Kendra Austin is a 54 y.o.  female present for [facility] follow up Type 2 diabetes mellitus with other specified complication, without long-term current use of insulin ([place]) Pt reports compliance with Trulicuty (she is q. 65K). She started this particular medication summer 2020. She was taking metformin prior with stomach upset.  Patient denies dizziness, hyperglycemic or hypoglycemic events. Patient denies dizziness, hyperglycemic or hypoglycemic events. Patient denies numbness, tingling in the extremities or nonhealing wounds of feet.  PNA series: PNA 23 UTD Flu shot: UTD 2020 (recommneded yearly) Foot exam: completed 08/22/2019 Eye exam: 12/2018 UTD.  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  A1c: 5.8 >5.6>5.5 today  Hyperlipidemia associated with type 2 diabetes mellitus (HCC) Tolerating statin.  Dyspepsia Still taking omprazole 40 mg QD.  Depression screen Kendra Austin 2/9 07/01/2018 03/01/2017  Decreased Interest 0 0  Down, Depressed, Hopeless 0 0  PHQ - 2 Score 0 0  Altered sleeping 0 -  Tired, decreased energy 0 -  Change in appetite 0 -  Feeling bad or failure about yourself  0 -  Trouble concentrating 0 -  Moving slowly or fidgety/restless 0 -  Suicidal thoughts 0 -  PHQ-9 Score 0 -  Difficult doing work/chores Not difficult at all -   No flowsheet data found.      Fall Risk  07/29/2018 07/01/2018 05/08/2018  Falls in the past year? 0 0 1  Number falls in past yr: 0 0 0  Injury with Fall? 0 0 1  Comment - - sprain ankle      Immunization  History  Administered Date(s) Administered  . Influenza,inj,Quad PF,6+ Mos 11/07/2018  . Influenza-Unspecified 10/24/2015, 10/23/2016  . PFIZER SARS-COV-2 Vaccination 04/06/2019, 04/18/2019  . Pneumococcal Polysaccharide-23 12/16/2018  . Tdap 01/24/2011    No exam data present  Past Medical History:  Diagnosis Date  . Allergy   . Arthritis    osteoarthritis; neck, R shoulder  . Atypical chest pain    has had prior cardiac eval in the past/ started 4 years ago/ corrected by back adjustmment  . Chicken pox   . Complication of anesthesia    Per pt, sensitive to sedation!  . Depression    related to stress from prior job; no medications, hospitalizations or SI  . Diabetes mellitus without complication ([place])   . DUB (dysfunctional uterine bleeding) 07/22/2015  . Essential tremor 07/22/2015   left hand  . Genital warts   . GERD (gastroesophageal reflux disease)   . Hemorrhoids   . History of UTI   . Hyperglycemia    Pre-diabetic per pt!  . Hyperlipidemia   . Seasonal allergies 07/22/2015  . Vertigo    resolves with chiropratic; and sees Dr. Tamala Austin   Allergies  Allergen Reactions  . Benadryl [Diphenhydramine Hcl (Sleep)]     Dizzy, extremely drowsy, can cause a lot effects per pt!  . Other     Muscle relaxers-per patient she "cannot function"  . Aleve [Naproxen Sodium]  Heart palpitations - can take ibuprofen  . Flexeril [Cyclobenzaprine] Other (See Comments)    Makes her extremely tired  . Meloxicam     Made her depressed   Past Surgical History:  Procedure Laterality Date  . COLONOSCOPY    . GANGLION CYST EXCISION     right wrist  . HYSTEROSCOPY    . TONSILLECTOMY AND ADENOIDECTOMY  1971/1972  . UPPER GASTROINTESTINAL ENDOSCOPY    . WISDOM TOOTH EXTRACTION     Family History  Problem Relation Age of Onset  . Macular degeneration Mother   . Colon polyps Mother   . Heart disease Father   . Diabetes Father   . Hyperlipidemia Father   . Macular degeneration  Maternal Grandmother   . Heart disease Maternal Grandmother   . Stroke Maternal Grandmother   . Heart disease Paternal Grandmother   . Heart attack Paternal Grandmother   . Heart disease Paternal Grandfather   . Heart attack Paternal Grandfather   . Colon cancer Neg Hx   . Esophageal cancer Neg Hx   . Stomach cancer Neg Hx   . Rectal cancer Neg Hx    Social History   Social History Narrative   Work or School: Hydrologist; Animal nutritionist    Lifestyle: no regular exercise; diet is pretty good - avoids gluten   Marital status/children/pets: Divorced   Safety:      -smoke alarm in the home:Yes     - wears seatbelt: Yes             Allergies as of 08/22/2019      Reactions   Benadryl [diphenhydramine Hcl (sleep)]    Dizzy, extremely drowsy, can cause a lot effects per pt!   Other    Muscle relaxers-per patient she "cannot function"   Aleve [naproxen Sodium]    Heart palpitations - can take ibuprofen   Flexeril [cyclobenzaprine] Other (See Comments)   Makes her extremely tired   Meloxicam    Made her depressed      Medication List       Accurate as of August 22, 2019  8:49 AM. If you have any questions, ask your nurse or doctor.        CULTURELLE PO Take by mouth daily.   FISH OIL PO Take by mouth.   fluticasone 50 MCG/ACT nasal spray Commonly known as: FLONASE Place 2 sprays into both nostrils daily.   LUTEIN PO Take by mouth.   MULTIVITAMIN ADULT PO Take by mouth.   omeprazole 40 MG capsule Commonly known as: PRILOSEC TAKE 1 CAPSULE BY MOUTH EVERY DAY   rosuvastatin 5 MG tablet Commonly known as: Crestor Take 1 tablet (5 mg total) by mouth daily.   Trulicity 5.36 UY/4.0HK Sopn Generic drug: Dulaglutide INJECT 0.75MG  INTO THE SKIN WEEKLY What changed: additional instructions   TURMERIC PO Take 1 capsule by mouth daily.   VITAMIN D PO Take by mouth.       All past medical history, surgical history, allergies, family history,  immunizations andmedications were updated in the EMR today and reviewed under the history and medication portions of their EMR.      ROS: 14 pt review of systems performed and negative (unless mentioned in an HPI)  Objective: BP 108/75 (BP Location: Right Arm, Patient Position: Sitting, Cuff Size: Normal)   Pulse 64   Temp 98.6 F (37 C) (Temporal)   Resp 17   Ht 5\' 4"  (1.626 m)   Wt 168 lb 4  oz (76.3 kg)   LMP 01/11/2018   SpO2 98%   BMI 28.88 kg/m  Gen: Afebrile. No acute distress.  HENT: AT. [place].  Eyes:Pupils Equal Round Reactive to light, Extraocular movements intact,  Conjunctiva without redness, discharge or icterus. CV: RRR no murmur, no edema, +2/4 P posterior tibialis pulses Chest: CTAB, no wheeze or crackles Skin: No rashes, purpura or petechiae.  Neuro:  Normal gait. PERLA. EOMi. Alert. Oriented x3  Psych: Normal affect, dress and demeanor. Normal speech. Normal thought content and judgment..   Results for orders placed or performed in visit on 08/22/19 (from the past 24 hour(s))  POCT glycosylated hemoglobin (Hb A1C)     Status: Abnormal   Collection Time: 08/22/19  8:41 AM  Result Value Ref Range   Hemoglobin A1C 5.5 4.0 - 5.6 %   HbA1c POC (<> result, manual entry) 5.5 4.0 - 5.6 %   HbA1c, POC (prediabetic range) 5.5 (A) 5.7 - 6.4 %   HbA1c, POC (controlled diabetic range) 5.5 0.0 - 7.0 %    Assessment/plan: Kendra Austin is a 54 y.o. female present for TOC/CMC Type 2 diabetes mellitus with other specified complication, without long-term current use of insulin ([place]) Stable.  Patient is doing very well on Trulicity every 10 days.  I encouraged her to monitor for any signs of low sugar. PNA series: PNA 23 completed Flu shot: UTD 2020 (recommneded yearly) Foot exam: completed11/23/2020 Eye exam: UTD 12/2018 [facility] at [facility].  A1c: 5.8> 5.6> 5.5 today  Hyperlipidemia associated with type 2 diabetes mellitus (HCC) Continue statin. Lipid panel  collected today TSH collected today  Dyspepsia Stable Continue omprazole 40 mg QD. Has been unable to taper down.    No follow-ups on file.  Orders Placed This Encounter  Procedures  . POCT glycosylated hemoglobin (Hb A1C)   No orders of the defined types were placed in this encounter.  Referral Orders  No referral(s) requested today      This visit occurred during the SARS-CoV-2 public health emergency.  Safety protocols were in place, including screening questions prior to the visit, additional usage of staff PPE, and extensive cleaning of exam room while observing appropriate contact time as indicated for disinfecting solutions.    Note is dictated utilizing voice recognition software. Although note has been proof read prior to signing, occasional typographical errors still can be missed. If any questions arise, please do not hesitate to call for verification.  Electronically signed by: Howard Pouch, DO [place]

## 2019-08-27 ENCOUNTER — Ambulatory Visit (INDEPENDENT_AMBULATORY_CARE_PROVIDER_SITE_OTHER): Payer: BC Managed Care – PPO | Admitting: Psychology

## 2019-08-27 DIAGNOSIS — F419 Anxiety disorder, unspecified: Secondary | ICD-10-CM | POA: Diagnosis not present

## 2019-09-01 ENCOUNTER — Ambulatory Visit: Payer: BC Managed Care – PPO | Admitting: Family Medicine

## 2019-09-01 ENCOUNTER — Encounter: Payer: Self-pay | Admitting: Family Medicine

## 2019-09-01 ENCOUNTER — Other Ambulatory Visit: Payer: Self-pay

## 2019-09-01 VITALS — BP 110/70 | HR 81 | Ht 64.0 in | Wt 169.0 lb

## 2019-09-01 DIAGNOSIS — M999 Biomechanical lesion, unspecified: Secondary | ICD-10-CM | POA: Diagnosis not present

## 2019-09-01 DIAGNOSIS — R293 Abnormal posture: Secondary | ICD-10-CM | POA: Diagnosis not present

## 2019-09-01 NOTE — Progress Notes (Signed)
[place] [address] [place] Phone: [phone] Subjective:   I Kendra Austin am serving as Austin Education administrator for Dr. Hulan Saas.  This visit occurred during the SARS-CoV-2 public health emergency.  Safety protocols were in place, including screening questions prior to the visit, additional usage of staff PPE, and extensive cleaning of exam room while observing appropriate contact time as indicated for disinfecting solutions.   I'm seeing this patient by the request  of:  Kuneff, Renee A, DO  CC: Back and neck pain follow-up [id number]  Kendra Austin is Austin 54 y.o. female coming in with complaint of back and neck pain. OMT 08/06/2019. Patient states her back has been out of shape lately. States it is better this week but took Austin week of stretching.  Patient does not know up with potentially Austin flare in her back at this time.  Started having increasing discomfort and pain.  Medications patient has been prescribed: Ibuprofen  Taking: Helps when she takes it         Reviewed prior external information including notes and imaging from previsou exam, outside providers and external EMR if available.   As well as notes that were available from care everywhere and other healthcare systems.  Past medical history, social, surgical and family history all reviewed in electronic medical record.  No pertanent information unless stated regarding to the chief complaint.   Past Medical History:  Diagnosis Date  . Allergy   . Arthritis    osteoarthritis; neck, R shoulder  . Atypical chest pain    has had prior cardiac eval in the past/ started 4 years ago/ corrected by back adjustmment  . Chicken pox   . Complication of anesthesia    Per pt, sensitive to sedation!  . Depression    related to stress from prior job; no medications, hospitalizations or SI  . Diabetes mellitus without complication ([place])   . DUB (dysfunctional uterine bleeding)  07/22/2015  . Essential tremor 07/22/2015   left hand  . Genital warts   . GERD (gastroesophageal reflux disease)   . Hemorrhoids   . History of UTI   . Hyperglycemia    Pre-diabetic per pt!  . Hyperlipidemia   . Seasonal allergies 07/22/2015  . Vertigo    resolves with chiropratic; and sees Dr. Tamala Julian    Allergies  Allergen Reactions  . Benadryl [Diphenhydramine Hcl (Sleep)]     Dizzy, extremely drowsy, can cause Austin lot effects per pt!  . Other     Muscle relaxers-per patient she "cannot function"  . Aleve [Naproxen Sodium]     Heart palpitations - can take ibuprofen  . Flexeril [Cyclobenzaprine] Other (See Comments)    Makes her extremely tired  . Meloxicam     Made her depressed     Review of Systems:  No headache, visual changes, nausea, vomiting, diarrhea, constipation, dizziness, abdominal pain, skin rash, fevers, chills, night sweats, weight loss, swollen lymph nodes, body aches, joint swelling, chest pain, shortness of breath, mood changes. POSITIVE muscle aches  Objective  Blood pressure 110/70, pulse 81, height 5\' 4"  (1.626 m), weight 169 lb (76.7 kg), last menstrual period 01/11/2018, SpO2 97 %.   General: No apparent distress alert and oriented x3 mood and affect normal, dressed appropriately.  HEENT: Pupils equal, extraocular movements intact  Respiratory: Patient's speak in full sentences and does not appear short of breath  Cardiovascular: No lower extremity edema, non tender, no erythema  Neuro: Cranial nerves II through XII are intact, neurovascularly intact in all extremities with 2+ DTRs and 2+ pulses.  Gait normal with good balance and coordination.  MSK:  Non tender with full range of motion and good stability and symmetric strength and tone of shoulders, elbows, wrist, hip, knee and ankles bilaterally.  Back -low back exam does have significant tightness noted in the L3-L4 L4 L4-5 areas.  Patient does have tightness noted.  Osteopathic findings  C2  flexed rotated and side bent right T7 extended rotated and side bent left L2 flexed rotated and side bent right Sacrum right on right        Assessment and Plan:  Posture imbalance Patient is Austin small increase tenderness in the lower back.  We discussed if this continues we need to consider the possibility of further imaging.  We discussed icing regimen and home exercise, which activities to do which wants to avoid.  Increase activity slowly.  Follow-up again in 4 to 8 weeks.    Nonallopathic problems  Decision today to treat with OMT was based on Physical Exam  After verbal consent patient was treated with HVLA, ME, FPR techniques in cervical,  thoracic, lumbar, and sacral  areas  Patient tolerated the procedure well with improvement in symptoms  Patient given exercises, stretches and lifestyle modifications  See medications in patient instructions if given  Patient will follow up in 4-8 weeks      The above documentation has been reviewed and is accurate and complete Lyndal Pulley, DO       Note: This dictation was prepared with Dragon dictation along with smaller phrase technology. Any transcriptional errors that result from this process are unintentional.

## 2019-09-01 NOTE — Assessment & Plan Note (Signed)
Patient is a small increase tenderness in the lower back.  We discussed if this continues we need to consider the possibility of further imaging.  We discussed icing regimen and home exercise, which activities to do which wants to avoid.  Increase activity slowly.  Follow-up again in 4 to 8 weeks.

## 2019-09-01 NOTE — Patient Instructions (Signed)
Think it is just a flare lets not change anything See me as scheduled

## 2019-09-17 ENCOUNTER — Ambulatory Visit (INDEPENDENT_AMBULATORY_CARE_PROVIDER_SITE_OTHER): Payer: BC Managed Care – PPO | Admitting: Psychology

## 2019-09-17 DIAGNOSIS — F419 Anxiety disorder, unspecified: Secondary | ICD-10-CM

## 2019-10-01 ENCOUNTER — Encounter: Payer: Self-pay | Admitting: Family Medicine

## 2019-10-01 ENCOUNTER — Ambulatory Visit: Payer: BC Managed Care – PPO | Admitting: Family Medicine

## 2019-10-01 ENCOUNTER — Other Ambulatory Visit: Payer: Self-pay

## 2019-10-01 VITALS — BP 112/80 | HR 70 | Ht 64.0 in | Wt 172.0 lb

## 2019-10-01 DIAGNOSIS — M999 Biomechanical lesion, unspecified: Secondary | ICD-10-CM

## 2019-10-01 DIAGNOSIS — R293 Abnormal posture: Secondary | ICD-10-CM | POA: Diagnosis not present

## 2019-10-01 NOTE — Patient Instructions (Signed)
Calcium pyruvate 1500 mg daily for 2 months Keep doing everything else See me again in 2 months

## 2019-10-01 NOTE — Progress Notes (Signed)
[facility] [address] [place] Phone: [phone] Subjective:   I Kendra Austin am serving as a Education administrator for Dr. Hulan Saas.  This visit occurred during the SARS-CoV-2 public health emergency.  Safety protocols were in place, including screening questions prior to the visit, additional usage of staff PPE, and extensive cleaning of exam room while observing appropriate contact time as indicated for disinfecting solutions.   I'm seeing this patient by the request  of:  Kuneff, Renee A, DO  CC: Back and neck pain follow-up  [id number]  Kendra Austin is a 54 y.o. female coming in with complaint of back and neck pain. OMT 09/01/2019. Patient states she is feeling better. Not 100%. Back, digestive issues, and anxiety all go up at about the same time and wants to know if they relate to each other. Patient has started acupuncture and states it has helped tremendously.   Medications patient has been prescribed: None  Taking: Nothing for pain         Reviewed prior external information including notes and imaging from previsou exam, outside providers and external EMR if available.   As well as notes that were available from care everywhere and other healthcare systems.  Past medical history, social, surgical and family history all reviewed in electronic medical record.  No pertanent information unless stated regarding to the chief complaint.   Past Medical History:  Diagnosis Date  . Allergy   . Arthritis    osteoarthritis; neck, R shoulder  . Atypical chest pain    has had prior cardiac eval in the past/ started 4 years ago/ corrected by back adjustmment  . Chicken pox   . Complication of anesthesia    Per pt, sensitive to sedation!  . Depression    related to stress from prior job; no medications, hospitalizations or SI  . Diabetes mellitus without complication ([place])   . DUB (dysfunctional uterine bleeding) 07/22/2015  .  Essential tremor 07/22/2015   left hand  . Genital warts   . GERD (gastroesophageal reflux disease)   . Hemorrhoids   . History of UTI   . Hyperglycemia    Pre-diabetic per pt!  . Hyperlipidemia   . Seasonal allergies 07/22/2015  . Vertigo    resolves with chiropratic; and sees Dr. Tamala Julian    Allergies  Allergen Reactions  . Benadryl [Diphenhydramine Hcl (Sleep)]     Dizzy, extremely drowsy, can cause a lot effects per pt!  . Other     Muscle relaxers-per patient she "cannot function"  . Aleve [Naproxen Sodium]     Heart palpitations - can take ibuprofen  . Flexeril [Cyclobenzaprine] Other (See Comments)    Makes her extremely tired  . Meloxicam     Made her depressed     Review of Systems:  No headache, visual changes, nausea, vomiting, diarrhea, constipation, dizziness, abdominal pain, skin rash, fevers, chills, night sweats, weight loss, swollen lymph nodes, body aches, joint swelling, chest pain, shortness of breath, mood changes. POSITIVE muscle aches  Objective  Blood pressure 112/80, pulse 70, height 5\' 4"  (1.626 m), weight 172 lb (78 kg), last menstrual period 01/11/2018, SpO2 98 %.   General: No apparent distress alert and oriented x3 mood and affect normal, dressed appropriately.  HEENT: Pupils equal, extraocular movements intact  Respiratory: Patient's speak in full sentences and does not appear short of breath  Cardiovascular: No lower extremity edema, non tender, no erythema  Neuro: Cranial nerves II  through XII are intact, neurovascularly intact in all extremities with 2+ DTRs and 2+ pulses.  Gait normal with good balance and coordination.  MSK:  Non tender with full range of motion and good stability and symmetric strength and tone of shoulders, elbows, wrist, hip, knee and ankles bilaterally.  Back -low back exam does have some mild tightness noted in the paraspinal musculature lumbar spine.  Patient does have some mild tightness in the thoracolumbar juncture.   Neck does have some tightness with bilateral sidebending negative Spurling's.  5 out of 5 strength of all the extremities.  Osteopathic findings  C2 flexed rotated and side bent right T9 extended rotated and side bent left L2 flexed rotated and side bent right Sacrum right on right       Assessment and Plan:    Nonallopathic problems  Decision today to treat with OMT was based on Physical Exam  After verbal consent patient was treated with HVLA, ME, FPR techniques in cervical, rib, thoracic, lumbar, and sacral  areas  Patient tolerated the procedure well with improvement in symptoms  Patient given exercises, stretches and lifestyle modifications  See medications in patient instructions if given  Patient will follow up in 4-8 weeks      The above documentation has been reviewed and is accurate and complete Lyndal Pulley, DO       Note: This dictation was prepared with Dragon dictation along with smaller phrase technology. Any transcriptional errors that result from this process are unintentional.

## 2019-10-01 NOTE — Assessment & Plan Note (Signed)
Patient had some mild tightness.  Discussed over-the-counter medications.  Responding well to manipulation.  No significant changes in management.  Follow-up again in 2 months

## 2019-10-08 ENCOUNTER — Ambulatory Visit: Payer: BC Managed Care – PPO | Admitting: Psychology

## 2019-10-10 ENCOUNTER — Ambulatory Visit (INDEPENDENT_AMBULATORY_CARE_PROVIDER_SITE_OTHER): Payer: BC Managed Care – PPO | Admitting: Psychology

## 2019-10-10 DIAGNOSIS — F419 Anxiety disorder, unspecified: Secondary | ICD-10-CM | POA: Diagnosis not present

## 2019-10-22 ENCOUNTER — Ambulatory Visit (INDEPENDENT_AMBULATORY_CARE_PROVIDER_SITE_OTHER): Payer: BC Managed Care – PPO | Admitting: Psychology

## 2019-10-22 DIAGNOSIS — F419 Anxiety disorder, unspecified: Secondary | ICD-10-CM | POA: Diagnosis not present

## 2019-11-05 ENCOUNTER — Ambulatory Visit (INDEPENDENT_AMBULATORY_CARE_PROVIDER_SITE_OTHER): Payer: BC Managed Care – PPO | Admitting: Psychology

## 2019-11-05 DIAGNOSIS — F419 Anxiety disorder, unspecified: Secondary | ICD-10-CM

## 2019-11-19 ENCOUNTER — Ambulatory Visit (INDEPENDENT_AMBULATORY_CARE_PROVIDER_SITE_OTHER): Payer: BC Managed Care – PPO | Admitting: Psychology

## 2019-11-19 DIAGNOSIS — F419 Anxiety disorder, unspecified: Secondary | ICD-10-CM | POA: Diagnosis not present

## 2019-12-01 NOTE — Progress Notes (Signed)
[facility] [address] [place] Phone: [phone] Subjective:   I Kendra [place] am serving as [place] Education administrator for Dr. Hulan Saas.  This visit occurred during the SARS-CoV-2 public health emergency.  Safety protocols were in place, including screening questions prior to the visit, additional usage of staff PPE, and extensive cleaning of exam room while observing appropriate contact time as indicated for disinfecting solutions.   I'm seeing this patient by the request  of:  Kuneff, Renee A, DO  CC: Neck and back pain follow-up  [id number]  Kendra [place] is [place] 54 y.o. female coming in with complaint of back and neck pain. OMT 10/01/2019. Patient states she was having issues with right hip on and off. Lower back on the left side is tight.  No she does have some increasing stress recently.  Medications patient has been prescribed: None         Reviewed prior external information including notes and imaging from previsou exam, outside providers and external EMR if available.   As well as notes that were available from care everywhere and other healthcare systems.  Past medical history, social, surgical and family history all reviewed in electronic medical record.  No pertanent information unless stated regarding to the chief complaint.   Past Medical History:  Diagnosis Date  . Allergy   . Arthritis    osteoarthritis; neck, R shoulder  . Atypical chest pain    has had prior cardiac eval in the past/ started 4 years ago/ corrected by back adjustmment  . Chicken pox   . Complication of anesthesia    Per pt, sensitive to sedation!  . Depression    related to stress from prior job; no medications, hospitalizations or SI  . Diabetes mellitus without complication ([place])   . DUB (dysfunctional uterine bleeding) 07/22/2015  . Essential tremor 07/22/2015   left hand  . Genital warts   . GERD (gastroesophageal reflux disease)   .  Hemorrhoids   . History of UTI   . Hyperglycemia    Pre-diabetic per pt!  . Hyperlipidemia   . Seasonal allergies 07/22/2015  . Vertigo    resolves with chiropratic; and sees Dr. Tamala Julian    Allergies  Allergen Reactions  . Benadryl [Diphenhydramine Hcl (Sleep)]     Dizzy, extremely drowsy, can cause [place] lot effects per pt!  . Other     Muscle relaxers-per patient she "cannot function"  . Aleve [Naproxen Sodium]     Heart palpitations - can take ibuprofen  . Flexeril [Cyclobenzaprine] Other (See Comments)    Makes her extremely tired  . Meloxicam     Made her depressed     Review of Systems:  No headache, visual changes, nausea, vomiting, diarrhea, constipation, dizziness, abdominal pain, skin rash, fevers, chills, night sweats, weight loss, swollen lymph nodes, body aches, joint swelling, chest pain, shortness of breath, mood changes. POSITIVE muscle aches  Objective  Blood pressure 112/70, pulse 80, height 5\' 4"  (1.626 m), weight 172 lb (78 kg), last menstrual period 01/11/2018, SpO2 98 %.   General: No apparent distress alert and oriented x3 mood and affect normal, dressed appropriately.  HEENT: Pupils equal, extraocular movements intact  Respiratory: Patient's speak in full sentences and does not appear short of breath  Cardiovascular: No lower extremity edema, non tender, no erythema  Neuro: Cranial nerves II through XII are intact, neurovascularly intact in all extremities with 2+ DTRs and 2+ pulses.  Gait normal  with good balance and coordination.  MSK:  Non tender with full range of motion and good stability and symmetric strength and tone of shoulders, elbows, wrist, hip, knee and ankles bilaterally.  Low back exam does have some very mild tightness in the paraspinal musculature.  Patient does have increasing tightness noted of the left sacroiliac joint.  Mild in the right thoracolumbar area.  Negative straight leg test.  Does lacks last 5 degrees of extension of the back  which is different than patient's baseline  Osteopathic findings  C7 flexed rotated and side bent right T9 extended rotated and side bent left L2 flexed rotated and side bent right Sacrum left on left       Assessment and Plan:  Posture imbalance Postural imbalances as well as with patient having some mild increase in stress.  He does respond fairly well to osteopathic manipulation.  We discussed posture and ergonomics, we discussed home exercise.  Patient is not taking any other medications.  Follow-up again in 2 to 3 months.    Nonallopathic problems  Decision today to treat with OMT was based on Physical Exam  After verbal consent patient was treated with HVLA, ME, FPR techniques in cervical,  thoracic, lumbar, and sacral  areas  Patient tolerated the procedure well with improvement in symptoms  Patient given exercises, stretches and lifestyle modifications  See medications in patient instructions if given  Patient will follow up in 4-8 weeks      The above documentation has been reviewed and is accurate and complete Lyndal Pulley, DO       Note: This dictation was prepared with Dragon dictation along with smaller phrase technology. Any transcriptional errors that result from this process are unintentional.

## 2019-12-03 ENCOUNTER — Other Ambulatory Visit: Payer: Self-pay

## 2019-12-03 ENCOUNTER — Ambulatory Visit: Payer: BC Managed Care – PPO | Admitting: Family Medicine

## 2019-12-03 ENCOUNTER — Ambulatory Visit (INDEPENDENT_AMBULATORY_CARE_PROVIDER_SITE_OTHER): Payer: BC Managed Care – PPO | Admitting: Psychology

## 2019-12-03 ENCOUNTER — Encounter: Payer: Self-pay | Admitting: Family Medicine

## 2019-12-03 VITALS — BP 112/70 | HR 80 | Ht 64.0 in | Wt 172.0 lb

## 2019-12-03 DIAGNOSIS — M999 Biomechanical lesion, unspecified: Secondary | ICD-10-CM

## 2019-12-03 DIAGNOSIS — F419 Anxiety disorder, unspecified: Secondary | ICD-10-CM

## 2019-12-03 DIAGNOSIS — R293 Abnormal posture: Secondary | ICD-10-CM

## 2019-12-03 NOTE — Patient Instructions (Signed)
Good to see you Thanks for the tip on the bnb Have a great time if I don't see you See me again in 2-3 months

## 2019-12-03 NOTE — Assessment & Plan Note (Signed)
Postural imbalances as well as with patient having some mild increase in stress.  He does respond fairly well to osteopathic manipulation.  We discussed posture and ergonomics, we discussed home exercise.  Patient is not taking any other medications.  Follow-up again in 2 to 3 months.

## 2019-12-05 ENCOUNTER — Ambulatory Visit (INDEPENDENT_AMBULATORY_CARE_PROVIDER_SITE_OTHER): Payer: BC Managed Care – PPO

## 2019-12-05 ENCOUNTER — Other Ambulatory Visit: Payer: Self-pay

## 2019-12-05 ENCOUNTER — Encounter: Payer: Self-pay | Admitting: Family Medicine

## 2019-12-05 DIAGNOSIS — Z23 Encounter for immunization: Secondary | ICD-10-CM | POA: Diagnosis not present

## 2019-12-17 ENCOUNTER — Ambulatory Visit (INDEPENDENT_AMBULATORY_CARE_PROVIDER_SITE_OTHER): Payer: BC Managed Care – PPO | Admitting: Psychology

## 2019-12-17 DIAGNOSIS — F419 Anxiety disorder, unspecified: Secondary | ICD-10-CM

## 2019-12-31 ENCOUNTER — Ambulatory Visit (INDEPENDENT_AMBULATORY_CARE_PROVIDER_SITE_OTHER): Payer: BC Managed Care – PPO | Admitting: Psychology

## 2019-12-31 DIAGNOSIS — F419 Anxiety disorder, unspecified: Secondary | ICD-10-CM | POA: Diagnosis not present

## 2020-01-14 ENCOUNTER — Ambulatory Visit: Payer: BC Managed Care – PPO | Admitting: Psychology

## 2020-01-28 ENCOUNTER — Ambulatory Visit (INDEPENDENT_AMBULATORY_CARE_PROVIDER_SITE_OTHER): Payer: BC Managed Care – PPO | Admitting: Psychology

## 2020-01-28 DIAGNOSIS — F419 Anxiety disorder, unspecified: Secondary | ICD-10-CM

## 2020-02-06 ENCOUNTER — Ambulatory Visit: Payer: BC Managed Care – PPO | Admitting: Family Medicine

## 2020-02-10 ENCOUNTER — Ambulatory Visit: Payer: BC Managed Care – PPO | Admitting: Family Medicine

## 2020-02-11 ENCOUNTER — Ambulatory Visit: Payer: BC Managed Care – PPO | Admitting: Psychology

## 2020-02-11 ENCOUNTER — Ambulatory Visit: Payer: BC Managed Care – PPO | Admitting: Family Medicine

## 2020-02-13 NOTE — Progress Notes (Signed)
[facility] [address] [place] Phone: [phone] Subjective:   I Kendra [place] am serving as a Education administrator for Dr. Hulan Saas.  This visit occurred during the SARS-CoV-2 public health emergency.  Safety protocols were in place, including screening questions prior to the visit, additional usage of staff PPE, and extensive cleaning of exam room while observing appropriate contact time as indicated for disinfecting solutions.   I'm seeing this patient by the request  of:  Kuneff, Renee A, DO  CC: Back and neck pain follow-up  [id number]  Kendra Austin is a 55 y.o. female coming in with complaint of back and neck pain. OMT 12/03/2019. Patient states she is doing well. Not as stiff as usual.  Patient states that the trip she was on it was very good for her.  Did have a lot of relaxation.  Patient is doing well.  Very mild tightness.  Started Pilates again recently.  Medications patient has been prescribed: None          Reviewed prior external information including notes and imaging from previsou exam, outside providers and external EMR if available.   As well as notes that were available from care everywhere and other healthcare systems.  Past medical history, social, surgical and family history all reviewed in electronic medical record.  No pertanent information unless stated regarding to the chief complaint.   Past Medical History:  Diagnosis Date  . Allergy   . Arthritis    osteoarthritis; neck, R shoulder  . Atypical chest pain    has had prior cardiac eval in the past/ started 4 years ago/ corrected by back adjustmment  . Chicken pox   . Complication of anesthesia    Per pt, sensitive to sedation!  . Depression    related to stress from prior job; no medications, hospitalizations or SI  . Diabetes mellitus without complication ([place])   . DUB (dysfunctional uterine bleeding) 07/22/2015  . Essential tremor 07/22/2015    left hand  . Genital warts   . GERD (gastroesophageal reflux disease)   . Hemorrhoids   . History of UTI   . Hyperglycemia    Pre-diabetic per pt!  . Hyperlipidemia   . Seasonal allergies 07/22/2015  . Vertigo    resolves with chiropratic; and sees Dr. Tamala Julian    Allergies  Allergen Reactions  . Benadryl [Diphenhydramine Hcl (Sleep)]     Dizzy, extremely drowsy, can cause a lot effects per pt!  . Other     Muscle relaxers-per patient she "cannot function"  . Aleve [Naproxen Sodium]     Heart palpitations - can take ibuprofen  . Flexeril [Cyclobenzaprine] Other (See Comments)    Makes her extremely tired  . Meloxicam     Made her depressed     Review of Systems:  No headache, visual changes, nausea, vomiting, diarrhea, constipation, dizziness, abdominal pain, skin rash, fevers, chills, night sweats, weight loss, swollen lymph nodes, body aches, joint swelling, chest pain, shortness of breath, mood changes. POSITIVE muscle aches  Objective  Blood pressure 140/80, pulse 63, height 5\' 4"  (1.626 m), weight 173 lb (78.5 kg), last menstrual period 01/11/2018, SpO2 100 %.   General: No apparent distress alert and oriented x3 mood and affect normal, dressed appropriately.  HEENT: Pupils equal, extraocular movements intact  Respiratory: Patient's speak in full sentences and does not appear short of breath  Cardiovascular: No lower extremity edema, non tender, no erythema  Gait normal with  good balance and coordination.  MSK:  Non tender with full range of motion and good stability and symmetric strength and tone of shoulders, elbows, wrist, hip, knee and ankles bilaterally.  Back -back exam does have some mild loss of lordosis.  Patient still core strength is doing very well.  Mild tightness noted in the paraspinal musculature in the parascapular region right greater than left.  Osteopathic findings  C6 flexed rotated and side bent left T9 extended rotated and side bent left L2 flexed  rotated and side bent right Sacrum right on right       Assessment and Plan:  Posture imbalance Patient is doing considerably better at this time.  Responding well to the osteopathic manipulation.  Patient is doing Pilates and seems to be helping her.  Increase activity slowly.  Icing regimen, home exercises follow-up again in 3 months   Nonallopathic problems  Decision today to treat with OMT was based on Physical Exam  After verbal consent patient was treated with HVLA, ME, FPR techniques in cervical,  thoracic, lumbar, and sacral  areas  Patient tolerated the procedure well with improvement in symptoms  Patient given exercises, stretches and lifestyle modifications  See medications in patient instructions if given  Patient will follow up in 4-8 weeks      The above documentation has been reviewed and is accurate and complete Lyndal Pulley, DO       Note: This dictation was prepared with Dragon dictation along with smaller phrase technology. Any transcriptional errors that result from this process are unintentional.

## 2020-02-16 ENCOUNTER — Other Ambulatory Visit: Payer: Self-pay

## 2020-02-16 ENCOUNTER — Ambulatory Visit: Payer: BC Managed Care – PPO | Admitting: Family Medicine

## 2020-02-16 ENCOUNTER — Encounter: Payer: Self-pay | Admitting: Family Medicine

## 2020-02-16 VITALS — BP 140/80 | HR 63 | Ht 64.0 in | Wt 173.0 lb

## 2020-02-16 DIAGNOSIS — R293 Abnormal posture: Secondary | ICD-10-CM | POA: Diagnosis not present

## 2020-02-16 DIAGNOSIS — M999 Biomechanical lesion, unspecified: Secondary | ICD-10-CM | POA: Diagnosis not present

## 2020-02-16 NOTE — Patient Instructions (Addendum)
Good to see you [place] was good to you See me again in 3 months

## 2020-02-16 NOTE — Assessment & Plan Note (Signed)
Patient is doing considerably better at this time.  Responding well to the osteopathic manipulation.  Patient is doing Pilates and seems to be helping her.  Increase activity slowly.  Icing regimen, home exercises follow-up again in 3 months

## 2020-02-17 ENCOUNTER — Ambulatory Visit (INDEPENDENT_AMBULATORY_CARE_PROVIDER_SITE_OTHER): Payer: BC Managed Care – PPO | Admitting: Family Medicine

## 2020-02-17 ENCOUNTER — Encounter: Payer: Self-pay | Admitting: Family Medicine

## 2020-02-17 VITALS — BP 116/72 | HR 64 | Temp 98.6°F | Ht 64.0 in | Wt 171.0 lb

## 2020-02-17 DIAGNOSIS — R1013 Epigastric pain: Secondary | ICD-10-CM | POA: Diagnosis not present

## 2020-02-17 DIAGNOSIS — Z23 Encounter for immunization: Secondary | ICD-10-CM

## 2020-02-17 DIAGNOSIS — E785 Hyperlipidemia, unspecified: Secondary | ICD-10-CM

## 2020-02-17 DIAGNOSIS — E1169 Type 2 diabetes mellitus with other specified complication: Secondary | ICD-10-CM | POA: Diagnosis not present

## 2020-02-17 LAB — POCT GLYCOSYLATED HEMOGLOBIN (HGB A1C)
HbA1c POC (<> result, manual entry): 5.4 % (ref 4.0–5.6)
HbA1c, POC (controlled diabetic range): 5.4 % (ref 0.0–7.0)
HbA1c, POC (prediabetic range): 5.4 % — AB (ref 5.7–6.4)
Hemoglobin A1C: 5.4 % (ref 4.0–5.6)

## 2020-02-17 LAB — CBC
HCT: 44.1 % (ref 36.0–46.0)
Hemoglobin: 14.6 g/dL (ref 12.0–15.0)
MCHC: 33 g/dL (ref 30.0–36.0)
MCV: 86.6 fl (ref 78.0–100.0)
Platelets: 206 10*3/uL (ref 150.0–400.0)
RBC: 5.09 Mil/uL (ref 3.87–5.11)
RDW: 13.8 % (ref 11.5–15.5)
WBC: 7.4 10*3/uL (ref 4.0–10.5)

## 2020-02-17 LAB — COMPREHENSIVE METABOLIC PANEL
ALT: 20 U/L (ref 0–35)
AST: 20 U/L (ref 0–37)
Albumin: 4.7 g/dL (ref 3.5–5.2)
Alkaline Phosphatase: 66 U/L (ref 39–117)
BUN: 12 mg/dL (ref 6–23)
CO2: 30 mEq/L (ref 19–32)
Calcium: 9.8 mg/dL (ref 8.4–10.5)
Chloride: 104 mEq/L (ref 96–112)
Creatinine, Ser: 0.73 mg/dL (ref 0.40–1.20)
GFR: 93.03 mL/min (ref >60.00)
Glucose, Bld: 95 mg/dL (ref 70–99)
Potassium: 3.8 mEq/L (ref 3.5–5.1)
Sodium: 142 mEq/L (ref 135–145)
Total Bilirubin: 0.6 mg/dL (ref 0.2–1.2)
Total Protein: 6.7 g/dL (ref 6.0–8.3)

## 2020-02-17 LAB — LIPID PANEL
Cholesterol: 139 mg/dL (ref 0–200)
HDL: 67.9 mg/dL (ref >39.00)
LDL Cholesterol: 48 mg/dL (ref 0–99)
NonHDL: 70.87
Total CHOL/HDL Ratio: 2
Triglycerides: 112 mg/dL (ref 0.0–149.0)
VLDL: 22.4 mg/dL (ref 0.0–40.0)

## 2020-02-17 LAB — MICROALBUMIN / CREATININE URINE RATIO
Creatinine,U: 23.2 mg/dL
Microalb Creat Ratio: 3 mg/g (ref 0.0–30.0)
Microalb, Ur: <0.7 mg/dL (ref 0.0–1.9)

## 2020-02-17 LAB — TSH: TSH: 1.95 u[IU]/mL (ref 0.35–4.50)

## 2020-02-17 MED ORDER — OMEPRAZOLE 40 MG PO CPDR: DELAYED_RELEASE_CAPSULE | ORAL | 1 refills | Status: DC

## 2020-02-17 MED ORDER — ROSUVASTATIN CALCIUM 5 MG PO TABS: 5.0000 mg | ORAL_TABLET | Freq: Every day | ORAL | 3 refills | Status: AC

## 2020-02-17 NOTE — Patient Instructions (Signed)
Next appt in 3 months.  Stop trulicity for 3 months - hopefully we can come off diabetes meds and control with diet and exercise.

## 2020-02-17 NOTE — Progress Notes (Signed)
Patient ID: Kendra Austin, female  DOB: Mar 21, 1965, 55 y.o.   MRN: [id number] Patient Care Team    Relationship Specialty Notifications Start End  Ma Hillock, DO PCP - General Family Medicine  12/16/18   Doran Stabler, MD Consulting Physician Gastroenterology  12/16/18   Cheri Fowler, MD Consulting Physician Obstetrics and Gynecology  12/16/18     Chief Complaint  Patient presents with  . Follow-up    CMC; pt is fasting    Subjective: Kendra Austin is a 55 y.o.  female present for [facility] follow up Type 2 diabetes mellitus with other specified complication, without long-term current use of insulin ([place]) Pt reports compliance  with Trulicuty (she is q. 93X). She started this particular medication summer 2020. She was taking metformin prior with stomach upset.  Patient denies dizziness, hyperglycemic or hypoglycemic events. Patient denies numbness, tingling in the extremities or nonhealing wounds of feet.  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  Cbc, cmp, tsh, lipids collected today A1c: 5.8> 5.6> 5.5> 5.4 today  Hyperlipidemia associated with type 2 diabetes mellitus ([place]) Tolerating statin.Lipid at goal 08/22/2019.  Dyspepsia Still taking omprazole 40 mg QD.Works well for her.   Depression screen [facility] 2/9 02/17/2020 07/01/2018 03/01/2017  Decreased Interest 0 0 0  Down, Depressed, Hopeless 0 0 0  PHQ - 2 Score 0 0 0  Altered sleeping - 0 -  Tired, decreased energy - 0 -  Change in appetite - 0 -  Feeling bad or failure about yourself  - 0 -  Trouble concentrating - 0 -  Moving slowly or fidgety/restless - 0 -  Suicidal thoughts - 0 -  PHQ-9 Score - 0 -  Difficult doing work/chores - Not difficult at all -   No flowsheet data found.      Fall Risk  07/29/2018 07/01/2018 05/08/2018  Falls in the past year? 0 0 1  Number falls in past yr: 0 0 0  Injury with Fall? 0 0 1  Comment - -  sprain ankle      Immunization History  Administered Date(s) Administered  . Influenza,inj,Quad PF,6+ Mos 11/07/2018, 12/05/2019  . Influenza-Unspecified 10/24/2015, 10/23/2016  . PFIZER(Purple Top)SARS-COV-2 Vaccination 04/06/2019, 04/18/2019, 11/15/2019  . Pneumococcal Polysaccharide-23 12/16/2018  . Tdap 01/24/2011  . Zoster Recombinat (Shingrix) 02/17/2020    No exam data present  Past Medical History:  Diagnosis Date  . Allergy   . Arthritis    osteoarthritis; neck, R shoulder  . Atypical chest pain    has had prior cardiac eval in the past/ started 4 years ago/ corrected by back adjustmment  . Chicken pox   . Complication of anesthesia    Per pt, sensitive to sedation!  . Depression    related to stress from prior job; no medications, hospitalizations or SI  . Diabetes mellitus without complication ([place])   . DUB (dysfunctional uterine bleeding) 07/22/2015  . Essential tremor 07/22/2015   left hand  . Genital warts   . GERD (gastroesophageal reflux disease)   . Hemorrhoids   . History of UTI   . Hyperglycemia    Pre-diabetic per pt!  . Hyperlipidemia   . Seasonal allergies 07/22/2015  . Vertigo    resolves with chiropratic; and sees Dr. Tamala Julian   Allergies  Allergen Reactions  . Benadryl [Diphenhydramine Hcl (Sleep)]     Dizzy, extremely drowsy, can cause a lot  effects per pt!  . Other     Muscle relaxers-per patient she "cannot function"  . Aleve [Naproxen Sodium]     Heart palpitations - can take ibuprofen  . Flexeril [Cyclobenzaprine] Other (See Comments)    Makes her extremely tired  . Meloxicam     Made her depressed   Past Surgical History:  Procedure Laterality Date  . COLONOSCOPY    . GANGLION CYST EXCISION     right wrist  . HYSTEROSCOPY    . TONSILLECTOMY AND ADENOIDECTOMY  1971/1972  . UPPER GASTROINTESTINAL ENDOSCOPY    . WISDOM TOOTH EXTRACTION     Family History  Problem Relation Age of Onset  . Macular degeneration Mother   . Colon  polyps Mother   . Heart disease Father   . Diabetes Father   . Hyperlipidemia Father   . Macular degeneration Maternal Grandmother   . Heart disease Maternal Grandmother   . Stroke Maternal Grandmother   . Heart disease Paternal Grandmother   . Heart attack Paternal Grandmother   . Heart disease Paternal Grandfather   . Heart attack Paternal Grandfather   . Colon cancer Neg Hx   . Esophageal cancer Neg Hx   . Stomach cancer Neg Hx   . Rectal cancer Neg Hx    Social History   Social History Narrative   Work or School: Hydrologist; Animal nutritionist    Lifestyle: no regular exercise; diet is pretty good - avoids gluten   Marital status/children/pets: Divorced   Safety:      -smoke alarm in the home:Yes     - wears seatbelt: Yes             Allergies as of 02/17/2020      Reactions   Benadryl [diphenhydramine Hcl (sleep)]    Dizzy, extremely drowsy, can cause a lot effects per pt!   Other    Muscle relaxers-per patient she "cannot function"   Aleve [naproxen Sodium]    Heart palpitations - can take ibuprofen   Flexeril [cyclobenzaprine] Other (See Comments)   Makes her extremely tired   Meloxicam    Made her depressed      Medication List       Accurate as of February 17, 2020  9:47 AM. If you have any questions, ask your nurse or doctor.        CULTURELLE PO Take by mouth daily.   FISH OIL PO Take by mouth.   fluticasone 50 MCG/ACT nasal spray Commonly known as: FLONASE Place 2 sprays into both nostrils daily.   LUTEIN PO Take by mouth.   MULTIVITAMIN ADULT PO Take by mouth.   omeprazole 40 MG capsule Commonly known as: PRILOSEC TAKE 1 CAPSULE BY MOUTH EVERY DAY   rosuvastatin 5 MG tablet Commonly known as: Crestor Take 1 tablet (5 mg total) by mouth daily.   Trulicity 1.41 CV/0.1TH Sopn Generic drug: Dulaglutide INJECT 0.75MG INTO THE SKIN q10days   TURMERIC PO Take 1 capsule by mouth daily.   UNABLE TO FIND 1,500 mg. Med Name:  Calcium pyruvate   VITAMIN D PO Take by mouth.       All past medical history, surgical history, allergies, family history, immunizations andmedications were updated in the EMR today and reviewed under the history and medication portions of their EMR.      ROS: 14 pt review of systems performed and negative (unless mentioned in an HPI)  Objective: BP 116/72   Pulse 64   Temp  98.6 F (37 C) (Oral)   Ht 5' 4"  (1.626 m)   Wt 171 lb (77.6 kg)   LMP 01/11/2018   SpO2 99%   BMI 29.35 kg/m  Gen: Afebrile. No acute distress. Nontoxic pleasant female.  HENT: AT. [place].  Eyes:Pupils Equal Round Reactive to light, Extraocular movements intact,  Conjunctiva without redness, discharge or icterus. Neck/lymp/endocrine: Supple,no lymphadenopathy, no thyromegaly CV: RRR no murmur, no edema, +2/4 P posterior tibialis pulses Chest: CTAB, no wheeze or crackles Skin: no rashes, purpura or petechiae.  Neuro:  Normal gait. PERLA. EOMi. Alert. Oriented x3 Psych: Normal affect, dress and demeanor. Normal speech. Normal thought content and judgment.   Results for orders placed or performed in visit on 02/17/20 (from the past 24 hour(s))  POCT HgB A1C     Status: Abnormal   Collection Time: 02/17/20  8:49 AM  Result Value Ref Range   Hemoglobin A1C 5.4 4.0 - 5.6 %   HbA1c POC (<> result, manual entry) 5.4 4.0 - 5.6 %   HbA1c, POC (prediabetic range) 5.4 (A) 5.7 - 6.4 %   HbA1c, POC (controlled diabetic range) 5.4 0.0 - 7.0 %    Assessment/plan: Kendra Austin is a 55 y.o. female present for TOC/CMC Type 2 diabetes mellitus with other specified complication, without long-term current use of insulin ([place]) Stable.  Hold Trulicity for 3 months.  > . May not need this med any longer if still remain < 6. 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  Cbc, cmp, tsh, lipids collected today A1c:  5.8> 5.6> 5.5> 5.4 today  Hyperlipidemia associated with type 2 diabetes mellitus ([place]) Continue statin Goal < 100, closer to 70 best  Dyspepsia Stable.  Continue  omprazole 40 mg QD. Has been unable to taper down.   Shingrix: #1 provided today and #2 will be provided at her 3 mos DM follow up   Return in about 3 months (around 05/17/2020) for [place] (30 min).  Orders Placed This Encounter  Procedures  . Varicella-zoster vaccine IM  . Microalbumin / creatinine urine ratio  . CBC  . Comp Met (CMET)  . TSH  . Lipid panel  . POCT HgB A1C   Meds ordered this encounter  Medications  . omeprazole (PRILOSEC) 40 MG capsule    Sig: TAKE 1 CAPSULE BY MOUTH EVERY DAY    Dispense:  90 capsule    Refill:  1  . rosuvastatin (CRESTOR) 5 MG tablet    Sig: Take 1 tablet (5 mg total) by mouth daily.    Dispense:  90 tablet    Refill:  3   Referral Orders  No referral(s) requested today      This visit occurred during the SARS-CoV-2 public health emergency.  Safety protocols were in place, including screening questions prior to the visit, additional usage of staff PPE, and extensive cleaning of exam room while observing appropriate contact time as indicated for disinfecting solutions.    Note is dictated utilizing voice recognition software. Although note has been proof read prior to signing, occasional typographical errors still can be missed. If any questions arise, please do not hesitate to call for verification.  Electronically signed by: Howard Pouch, DO [place]

## 2020-02-19 LAB — HM MAMMOGRAPHY

## 2020-02-20 ENCOUNTER — Ambulatory Visit (INDEPENDENT_AMBULATORY_CARE_PROVIDER_SITE_OTHER): Payer: BC Managed Care – PPO | Admitting: Psychology

## 2020-02-20 DIAGNOSIS — F419 Anxiety disorder, unspecified: Secondary | ICD-10-CM

## 2020-02-25 ENCOUNTER — Ambulatory Visit: Payer: BC Managed Care – PPO | Admitting: Psychology

## 2020-02-26 ENCOUNTER — Ambulatory Visit: Payer: BC Managed Care – PPO | Admitting: Psychology

## 2020-03-18 ENCOUNTER — Ambulatory Visit (INDEPENDENT_AMBULATORY_CARE_PROVIDER_SITE_OTHER): Payer: BC Managed Care – PPO | Admitting: Psychology

## 2020-03-18 DIAGNOSIS — F419 Anxiety disorder, unspecified: Secondary | ICD-10-CM

## 2020-04-08 ENCOUNTER — Ambulatory Visit (INDEPENDENT_AMBULATORY_CARE_PROVIDER_SITE_OTHER): Payer: BC Managed Care – PPO | Admitting: Psychology

## 2020-04-08 DIAGNOSIS — F419 Anxiety disorder, unspecified: Secondary | ICD-10-CM

## 2020-04-29 ENCOUNTER — Ambulatory Visit (INDEPENDENT_AMBULATORY_CARE_PROVIDER_SITE_OTHER): Payer: BC Managed Care – PPO | Admitting: Psychology

## 2020-04-29 ENCOUNTER — Telehealth: Payer: Self-pay

## 2020-04-29 DIAGNOSIS — F419 Anxiety disorder, unspecified: Secondary | ICD-10-CM | POA: Diagnosis not present

## 2020-04-29 NOTE — Telephone Encounter (Signed)
She is coming in for appt on 4/26, wants to know appropriate time frame to receive second covid booster since she will be receiving the second shingles vaccine during her upcoming appt. Last covid booster received on 11/15/19.   Please Advise.

## 2020-04-30 NOTE — Telephone Encounter (Signed)
I would recommend at about 2 weeks between vaccines. It is safe to do them closer together as well- however I typically encourage people to complete one at a time with 2 weeks between,

## 2020-04-30 NOTE — Telephone Encounter (Signed)
Spoke with pt regarding instructions for immunizations

## 2020-05-14 NOTE — Progress Notes (Signed)
[place] [facility] [place] Phone: [phone] Subjective:   [name], am serving as a scribe for Dr. Hulan Saas. This visit occurred during the SARS-CoV-2 public health emergency.  Safety protocols were in place, including screening questions prior to the visit, additional usage of staff PPE, and extensive cleaning of exam room while observing appropriate contact time as indicated for disinfecting solutions.   I'm seeing this patient by the request  of:  Kuneff, Renee A, DO  CC: Back pain follow-up  VPX:[id number]  NASHALY DORANTES is a 55 y.o. female coming in with complaint of back and neck pain. OMT 02/16/2020. Patient states that her R hip and lower back is bothering her as she was standing in ICU. Also having neck pain due to stress.  Patient has not been quite as active secondary to the discomfort and pain and being out of the routine.  Patient did have her father passed while she was down there.  Medications patient has been prescribed: None          Reviewed prior external information including notes and imaging from previsou exam, outside providers and external EMR if available.   As well as notes that were available from care everywhere and other healthcare systems.  Past medical history, social, surgical and family history all reviewed in electronic medical record.  No pertanent information unless stated regarding to the chief complaint.   Past Medical History:  Diagnosis Date  . Allergy   . Arthritis    osteoarthritis; neck, R shoulder  . Atypical chest pain    has had prior cardiac eval in the past/ started 4 years ago/ corrected by back adjustmment  . Chicken pox   . Complication of anesthesia    Per pt, sensitive to sedation!  . Depression    related to stress from prior job; no medications, hospitalizations or SI  . Diabetes mellitus without complication ([place])   . DUB (dysfunctional uterine  bleeding) 07/22/2015  . Essential tremor 07/22/2015   left hand  . Genital warts   . GERD (gastroesophageal reflux disease)   . Hemorrhoids   . History of UTI   . Hyperglycemia    Pre-diabetic per pt!  . Hyperlipidemia   . Seasonal allergies 07/22/2015  . Vertigo    resolves with chiropratic; and sees Dr. Tamala Julian    Allergies  Allergen Reactions  . Benadryl [Diphenhydramine Hcl (Sleep)]     Dizzy, extremely drowsy, can cause a lot effects per pt!  . Other     Muscle relaxers-per patient she "cannot function"  . Aleve [Naproxen Sodium]     Heart palpitations - can take ibuprofen  . Flexeril [Cyclobenzaprine] Other (See Comments)    Makes her extremely tired  . Meloxicam     Made her depressed     Review of Systems:  No headache, visual changes, nausea, vomiting, diarrhea, constipation, dizziness, abdominal pain, skin rash, fevers, chills, night sweats, weight loss, swollen lymph nodes, body aches, joint swelling, chest pain, shortness of breath, mood changes. POSITIVE muscle aches  Objective  Blood pressure 124/80, pulse 79, height 5\' 4"  (1.626 m), weight 174 lb (78.9 kg), last menstrual period 01/11/2018, SpO2 98 %.   General: No apparent distress alert and oriented x3 mood and affect normal, dressed appropriately.  HEENT: Pupils equal, extraocular movements intact  Respiratory: Patient's speak in full sentences and does not appear short of breath  Cardiovascular: No lower extremity edema, non  tender, no erythema  Gait normal with good balance and coordination.  MSK:  Non tender with full range of motion and good stability and symmetric strength and tone of shoulders, elbows, wrist, hip, knee and ankles bilaterally.  Back -patient does have significant increasing tenderness and tightness noted in the parascapular region right greater than left.  Patient also back does have some increasing tightness than usual.  Has the locking of the last 5 degrees of flexion and  extension. Lower back does have tightness noted in the paraspinal musculature mostly on the right side compared to left.  Negative Faber negative straight leg test  Osteopathic findings  C2 flexed rotated and side bent right C6 flexed rotated and side bent left T3 extended rotated and side bent right inhaled rib T11 extended rotated and side bent left L2 flexed rotated and side bent right Sacrum right on right       Assessment and Plan:   Posture imbalance Patient has had a longstanding issue with muscle imbalances.  Patient recently has been sleeping more at the hotel as well as seeing her father in the hospital.  Recently did have loss of her father that likely is contributing with some stress as well.  Discussed icing regimen and home exercises, which activities to doing which wants to avoid.  Increase activity slowly.  Follow-up with me again 6 to 8 weeks    Nonallopathic problems  Decision today to treat with OMT was based on Physical Exam  After verbal consent patient was treated with HVLA, ME, FPR techniques in cervical, rib, thoracic, lumbar, and sacral  areas  Patient tolerated the procedure well with improvement in symptoms  Patient given exercises, stretches and lifestyle modifications  See medications in patient instructions if given  Patient will follow up in 4-8 weeks      The above documentation has been reviewed and is accurate and complete Lyndal Pulley, DO       Note: This dictation was prepared with Dragon dictation along with smaller phrase technology. Any transcriptional errors that result from this process are unintentional.

## 2020-05-17 ENCOUNTER — Encounter: Payer: Self-pay | Admitting: Family Medicine

## 2020-05-17 ENCOUNTER — Ambulatory Visit: Payer: BC Managed Care – PPO | Admitting: Family Medicine

## 2020-05-17 ENCOUNTER — Other Ambulatory Visit: Payer: Self-pay

## 2020-05-17 VITALS — BP 124/80 | HR 79 | Ht 64.0 in | Wt 174.0 lb

## 2020-05-17 DIAGNOSIS — M9902 Segmental and somatic dysfunction of thoracic region: Secondary | ICD-10-CM

## 2020-05-17 DIAGNOSIS — M9903 Segmental and somatic dysfunction of lumbar region: Secondary | ICD-10-CM | POA: Diagnosis not present

## 2020-05-17 DIAGNOSIS — M9904 Segmental and somatic dysfunction of sacral region: Secondary | ICD-10-CM

## 2020-05-17 DIAGNOSIS — R293 Abnormal posture: Secondary | ICD-10-CM

## 2020-05-17 DIAGNOSIS — M9901 Segmental and somatic dysfunction of cervical region: Secondary | ICD-10-CM

## 2020-05-17 NOTE — Assessment & Plan Note (Addendum)
Patient has had a longstanding issue with muscle imbalances.  Patient recently has been sleeping more at the hotel as well as seeing her father in the hospital.  Recently did have loss of her father that likely is contributing with some stress as well.  Discussed icing regimen and home exercises, which activities to doing which wants to avoid.  Increase activity slowly.  Follow-up with me again 6 to 8 weeks

## 2020-05-17 NOTE — Patient Instructions (Signed)
Sorry for your loss Good to see you Get back into routine See me in 5-6 weeks before [place]

## 2020-05-18 ENCOUNTER — Other Ambulatory Visit: Payer: Self-pay

## 2020-05-18 ENCOUNTER — Ambulatory Visit: Payer: BC Managed Care – PPO | Admitting: Family Medicine

## 2020-05-18 ENCOUNTER — Encounter: Payer: Self-pay | Admitting: Family Medicine

## 2020-05-18 VITALS — BP 106/67 | HR 56 | Temp 98.4°F | Ht 64.0 in | Wt 173.0 lb

## 2020-05-18 DIAGNOSIS — Z23 Encounter for immunization: Secondary | ICD-10-CM

## 2020-05-18 DIAGNOSIS — E785 Hyperlipidemia, unspecified: Secondary | ICD-10-CM

## 2020-05-18 DIAGNOSIS — R1013 Epigastric pain: Secondary | ICD-10-CM

## 2020-05-18 DIAGNOSIS — E1169 Type 2 diabetes mellitus with other specified complication: Secondary | ICD-10-CM

## 2020-05-18 DIAGNOSIS — E663 Overweight: Secondary | ICD-10-CM | POA: Diagnosis not present

## 2020-05-18 LAB — POCT GLYCOSYLATED HEMOGLOBIN (HGB A1C)
HbA1c POC (<> result, manual entry): 5.4 % (ref 4.0–5.6)
HbA1c, POC (controlled diabetic range): 5.4 % (ref 0.0–7.0)
HbA1c, POC (prediabetic range): 5.4 % — AB (ref 5.7–6.4)
Hemoglobin A1C: 5.4 % (ref 4.0–5.6)

## 2020-05-18 MED ORDER — OMEPRAZOLE 40 MG PO CPDR: DELAYED_RELEASE_CAPSULE | ORAL | 1 refills | Status: DC

## 2020-05-18 MED ORDER — FAMOTIDINE 20 MG PO TABS: 20.0000 mg | ORAL_TABLET | Freq: Two times a day (BID) | ORAL | 5 refills | Status: DC

## 2020-05-18 NOTE — Progress Notes (Signed)
Patient ID: Kendra Austin, female  DOB: 1965/01/29, 55 y.o.   MRN: [id number] Patient Care Team    Relationship Specialty Notifications Start End  Ma Hillock, DO PCP - General Family Medicine  12/16/18   Doran Stabler, MD Consulting Physician Gastroenterology  12/16/18   Cheri Fowler, MD Consulting Physician Obstetrics and Gynecology  12/16/18     Chief Complaint  Patient presents with  . Follow-up    [facility]; pt is fasting    Subjective: Kendra Austin is a 55 y.o.  female present for [facility] follow up Type 2 diabetes mellitus with other specified complication, without long-term current use of insulin ([place]) Pt reports was started on Trulicity summer 4132 by her prior PCP.  Her A1c's have been in the 4-4.0 range with Trulicity even spaced out further than weekly.  Last appointment we discontinued Trulicity to see if she needed medication any longer.  She reports she has continue to watch her diet and has continued her exercise regimen.   She was taking metformin prior as well with stomach upset.  Patient denies dizziness, hyperglycemic or hypoglycemic events. Patient denies numbness, tingling in the extremities or nonhealing wounds of feet.  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    Hyperlipidemia associated with type 2 diabetes mellitus ([place]) Compliant with statin.Lipid at goal 08/22/2019.  Dyspepsia Still taking omprazole 40 mg QD.Works well for her.  Needs daily or symptoms will return.  Depression screen [facility] 2/9 02/17/2020 07/01/2018 03/01/2017  Decreased Interest 0 0 0  Down, Depressed, Hopeless 0 0 0  PHQ - 2 Score 0 0 0  Altered sleeping - 0 -  Tired, decreased energy - 0 -  Change in appetite - 0 -  Feeling bad or failure about yourself  - 0 -  Trouble concentrating - 0 -  Moving slowly or fidgety/restless - 0 -  Suicidal thoughts - 0 -  PHQ-9 Score - 0 -  Difficult doing  work/chores - Not difficult at all -   No flowsheet data found.      Fall Risk  07/29/2018 07/01/2018 05/08/2018  Falls in the past year? 0 0 1  Number falls in past yr: 0 0 0  Injury with Fall? 0 0 1  Comment - - sprain ankle      Immunization History  Administered Date(s) Administered  . Influenza,inj,Quad PF,6+ Mos 11/07/2018, 12/05/2019  . Influenza-Unspecified 10/24/2015, 10/23/2016  . PFIZER Comirnaty(Gray Top)Covid-19 Tri-Sucrose Vaccine 05/02/2020  . PFIZER(Purple Top)SARS-COV-2 Vaccination 04/06/2019, 04/18/2019, 11/15/2019, 05/08/2020  . Pneumococcal Polysaccharide-23 12/16/2018  . Tdap 01/24/2011  . Zoster Recombinat (Shingrix) 02/17/2020, 05/18/2020    No exam data present  Past Medical History:  Diagnosis Date  . Allergy   . Arthritis    osteoarthritis; neck, R shoulder  . Atypical chest pain    has had prior cardiac eval in the past/ started 4 years ago/ corrected by back adjustmment  . Chicken pox   . Complication of anesthesia    Per pt, sensitive to sedation!  . Depression    related to stress from prior job; no medications, hospitalizations or SI  . Diabetes mellitus without complication ([place])   . DUB (dysfunctional uterine bleeding) 07/22/2015  . Essential tremor 07/22/2015   left hand  . Genital warts   . GERD (gastroesophageal reflux disease)   . Hemorrhoids   . History of UTI   .  Hyperglycemia    Pre-diabetic per pt!  . Hyperlipidemia   . Seasonal allergies 07/22/2015  . Vertigo    resolves with chiropratic; and sees Dr. Tamala Julian   Allergies  Allergen Reactions  . Benadryl [Diphenhydramine Hcl (Sleep)]     Dizzy, extremely drowsy, can cause a lot effects per pt!  . Other     Muscle relaxers-per patient she "cannot function"  . Aleve [Naproxen Sodium]     Heart palpitations - can take ibuprofen  . Flexeril [Cyclobenzaprine] Other (See Comments)    Makes her extremely tired  . Meloxicam     Made her depressed   Past Surgical History:   Procedure Laterality Date  . COLONOSCOPY    . GANGLION CYST EXCISION     right wrist  . HYSTEROSCOPY    . TONSILLECTOMY AND ADENOIDECTOMY  1971/1972  . UPPER GASTROINTESTINAL ENDOSCOPY    . WISDOM TOOTH EXTRACTION     Family History  Problem Relation Age of Onset  . Macular degeneration Mother   . Colon polyps Mother   . Heart disease Father   . Diabetes Father   . Hyperlipidemia Father   . Macular degeneration Maternal Grandmother   . Heart disease Maternal Grandmother   . Stroke Maternal Grandmother   . Heart disease Paternal Grandmother   . Heart attack Paternal Grandmother   . Heart disease Paternal Grandfather   . Heart attack Paternal Grandfather   . Colon cancer Neg Hx   . Esophageal cancer Neg Hx   . Stomach cancer Neg Hx   . Rectal cancer Neg Hx    Social History   Social History Narrative   Work or School: Hydrologist; Animal nutritionist    Lifestyle: no regular exercise; diet is pretty good - avoids gluten   Marital status/children/pets: Divorced   Safety:      -smoke alarm in the home:Yes     - wears seatbelt: Yes             Allergies as of 05/18/2020      Reactions   Benadryl [diphenhydramine Hcl (sleep)]    Dizzy, extremely drowsy, can cause a lot effects per pt!   Other    Muscle relaxers-per patient she "cannot function"   Aleve [naproxen Sodium]    Heart palpitations - can take ibuprofen   Flexeril [cyclobenzaprine] Other (See Comments)   Makes her extremely tired   Meloxicam    Made her depressed      Medication List       Accurate as of May 18, 2020 11:59 PM. If you have any questions, ask your nurse or doctor.        STOP taking these medications   Trulicity 6.07 PX/1.0GY Sopn Generic drug: Dulaglutide Stopped by: Howard Pouch, DO     TAKE these medications   CULTURELLE PO Take by mouth daily.   famotidine 20 MG tablet Commonly known as: Pepcid Take 1 tablet (20 mg total) by mouth 2 (two) times daily. Started by:  Howard Pouch, DO   FISH OIL PO Take by mouth.   fluticasone 50 MCG/ACT nasal spray Commonly known as: FLONASE Place 2 sprays into both nostrils daily.   LUTEIN PO Take by mouth.   MULTIVITAMIN ADULT PO Take by mouth.   omeprazole 40 MG capsule Commonly known as: PRILOSEC TAKE 1 CAPSULE BY MOUTH EVERY DAY   rosuvastatin 5 MG tablet Commonly known as: Crestor Take 1 tablet (5 mg total) by mouth daily.   TURMERIC  PO Take 1 capsule by mouth daily.   UNABLE TO FIND 1,500 mg. Med Name: Calcium pyruvate   VITAMIN D PO Take by mouth.       All past medical history, surgical history, allergies, family history, immunizations andmedications were updated in the EMR today and reviewed under the history and medication portions of their EMR.      ROS: 14 pt review of systems performed and negative (unless mentioned in an HPI)  Objective: BP 106/67   Pulse (!) 56   Temp 98.4 F (36.9 C) (Oral)   Ht 5\' 4"  (1.626 m)   Wt 173 lb (78.5 kg)   LMP 01/11/2018   SpO2 99%   BMI 29.70 kg/m  Gen: Afebrile. No acute distress.  HENT: AT. [place].  Eyes:Pupils Equal Round Reactive to light, Extraocular movements intact,  Conjunctiva without redness, discharge or icterus. Neck/lymp/endocrine: Supple, no lymphadenopathy, no thyromegaly CV: RRR no murmur, no edema, +2/4 P posterior tibialis pulses Chest: CTAB, no wheeze or crackles Skin: No rashes, purpura or petechiae.  Neuro:  Normal gait. PERLA. EOMi. Alert. Oriented x3 Psych: Normal affect, dress and demeanor. Normal speech. Normal thought content and judgment.   Assessment/plan: Kendra Austin is a 55 y.o. female present for TOC/CMC Type 2 diabetes mellitus with other specified complication, without long-term current use of insulin (HCC) Stable, diet controlled now.  Has been off Trulicity greater than 3 months and A1c is still 5.4.. 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  A1c: 5.8> 5.6> 5.5> 5.4> 5.4 today Follow-up early December for CMC/CPE.  Labs will be due at that appointment She is aware if she notices she is gaining weight, her sugars are increasing or her diet changes, she should follow-up sooner to have her A1c checked.  Hyperlipidemia associated with type 2 diabetes mellitus (HCC) Continue Crestor 5 mg nightly-goal < 100, closer to 70 best  Dyspepsia Continue omprazole 40 mg QD. Has been unable to taper down.   Shingrix: #2 provided today   Return in about 7 months (around 12/27/2020) for CPE (30 min), [facility]MC (30 min).  Orders Placed This Encounter  Procedures  . Varicella-zoster vaccine IM  . POCT HgB A1C   Meds ordered this encounter  Medications  . omeprazole (PRILOSEC) 40 MG capsule    Sig: TAKE 1 CAPSULE BY MOUTH EVERY DAY    Dispense:  90 capsule    Refill:  1  . famotidine (PEPCID) 20 MG tablet    Sig: Take 1 tablet (20 mg total) by mouth 2 (two) times daily.    Dispense:  60 tablet    Refill:  5   Referral Orders  No referral(s) requested today      This visit occurred during the SARS-CoV-2 public health emergency.  Safety protocols were in place, including screening questions prior to the visit, additional usage of staff PPE, and extensive cleaning of exam room while observing appropriate contact time as indicated for disinfecting solutions.    Note is dictated utilizing voice recognition software. Although note has been proof read prior to signing, occasional typographical errors still can be missed. If any questions arise, please do not hesitate to call for verification.  Electronically signed by: Howard Pouch, DO [place]

## 2020-05-18 NOTE — Patient Instructions (Addendum)
  Great to see you today.  I have refilled the medication(s) we provide.   Next appt in December as physical and we will collect fasting labs.

## 2020-05-27 ENCOUNTER — Ambulatory Visit (INDEPENDENT_AMBULATORY_CARE_PROVIDER_SITE_OTHER): Payer: BC Managed Care – PPO | Admitting: Psychology

## 2020-05-27 DIAGNOSIS — F419 Anxiety disorder, unspecified: Secondary | ICD-10-CM | POA: Diagnosis not present

## 2020-05-29 IMAGING — US US ABDOMEN COMPLETE
1 series · 14 of 25 positions shown · non-contrast
Comparison: 08/02/2015

CLINICAL DATA: Epigastric pain, abdominal pain for 3 months

EXAM:
ABDOMEN ULTRASOUND COMPLETE

[Series 1: us abdomen complete · 14 of 77 slices shown]
[im 1/77]
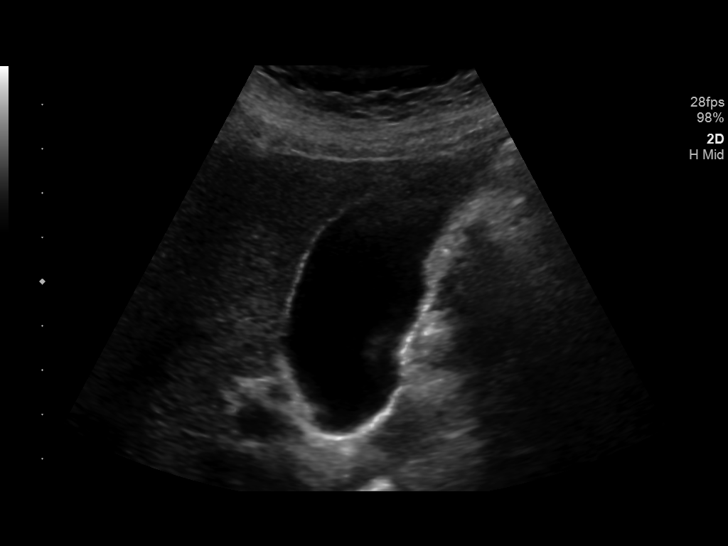
[im 7/77]
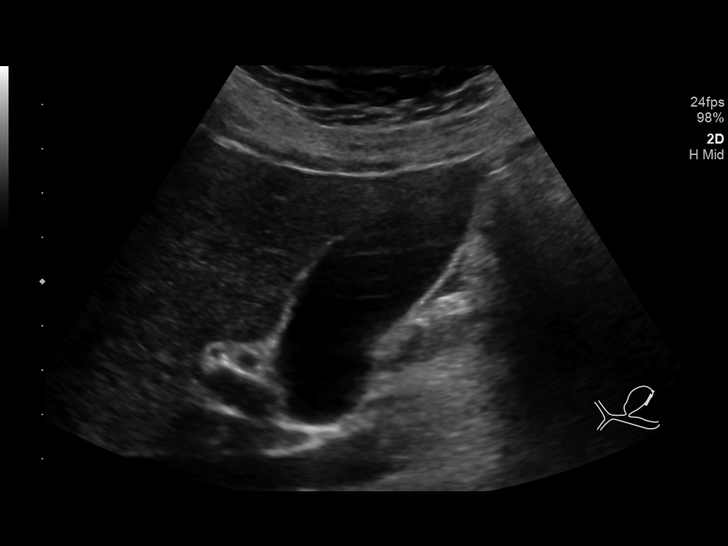
[im 13/77]
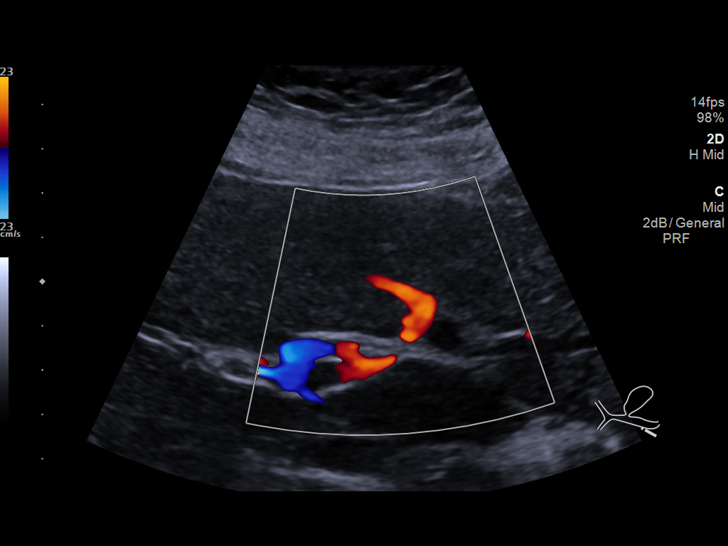
[im 20/77]
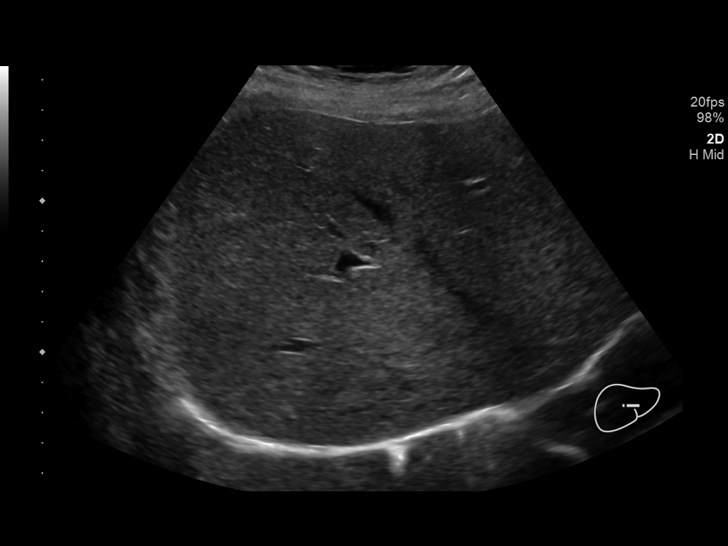
[im 26/77]
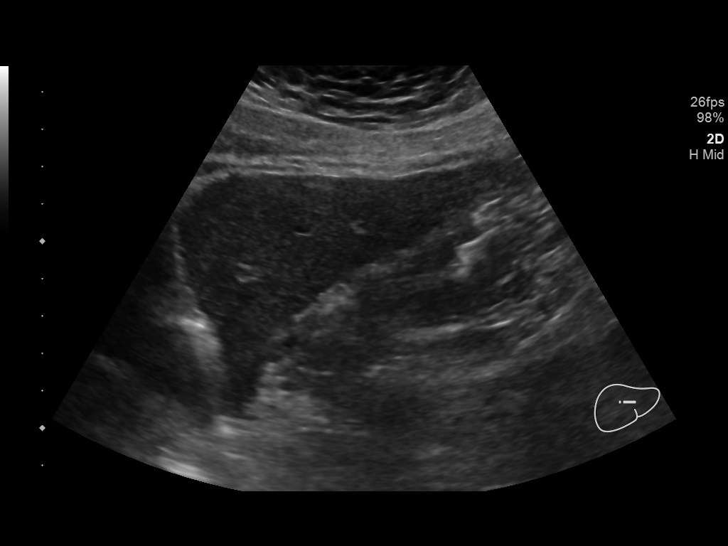
[im 29/77]
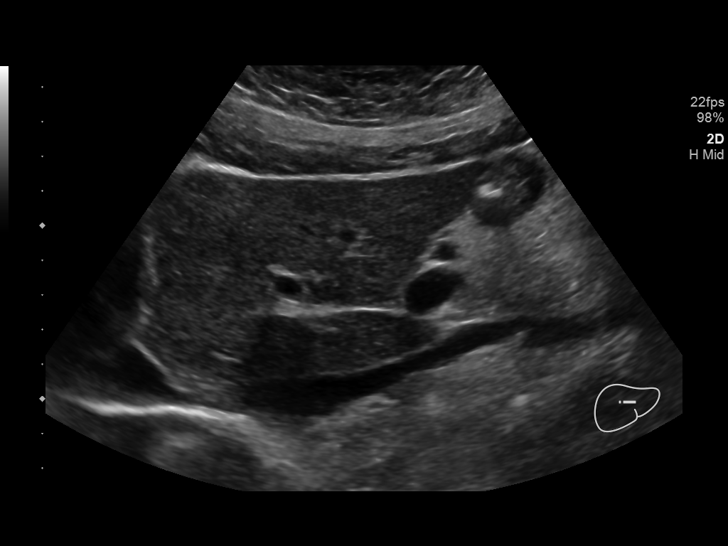
[im 35/77]
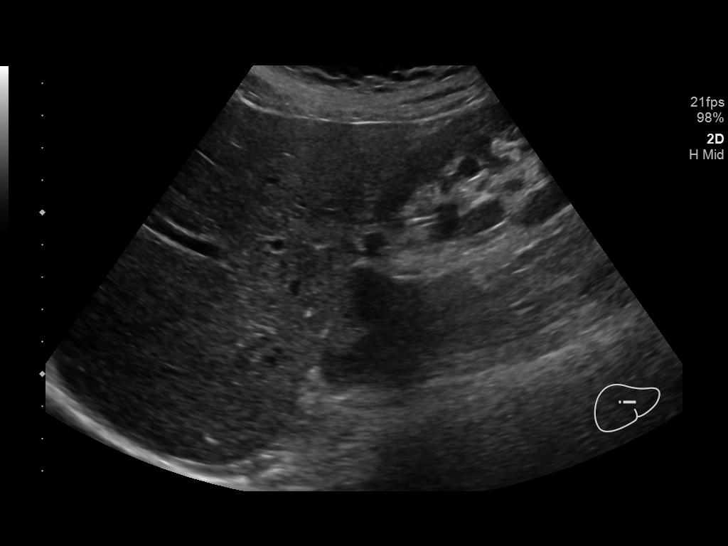
[im 42/77]
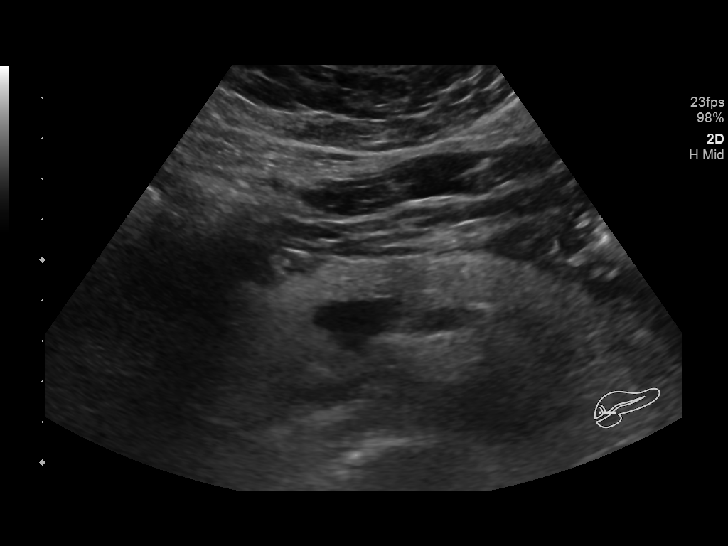
[im 48/77]
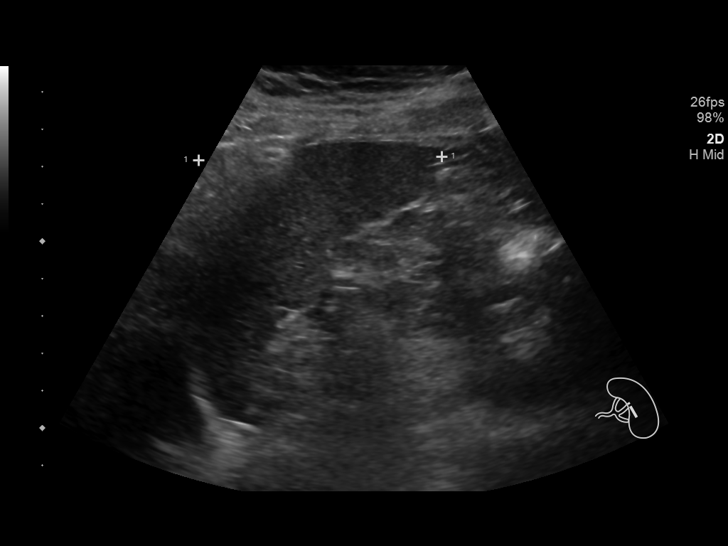
[im 51/77]
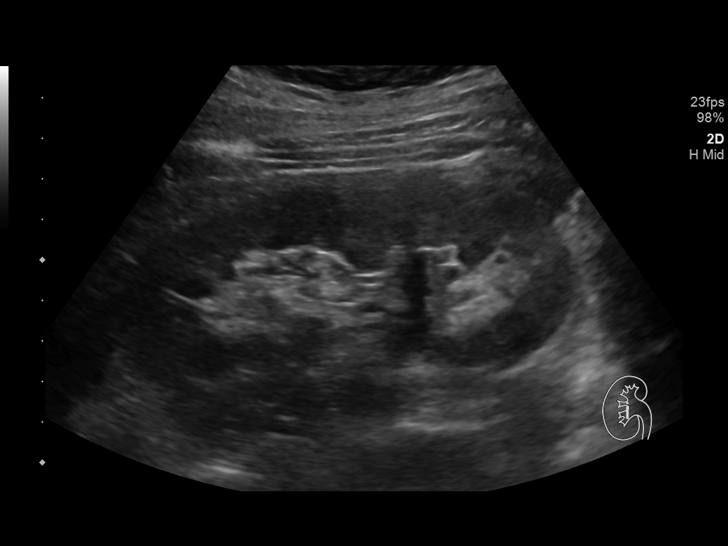
[im 58/77]
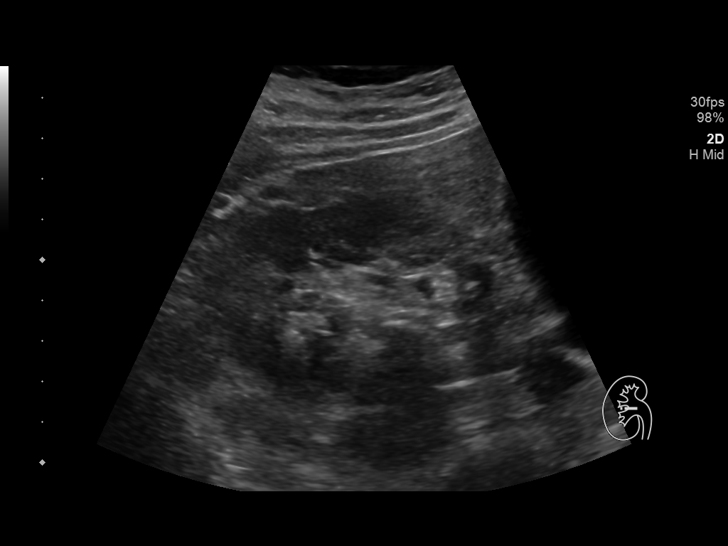
[im 64/77]
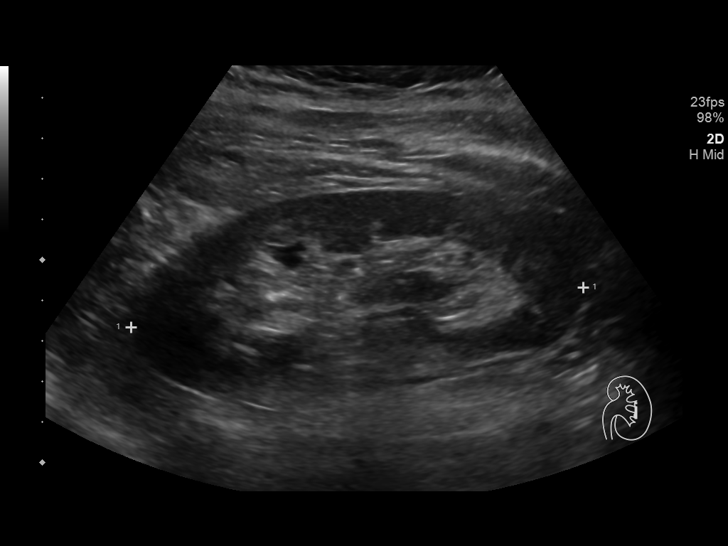
[im 70/77]
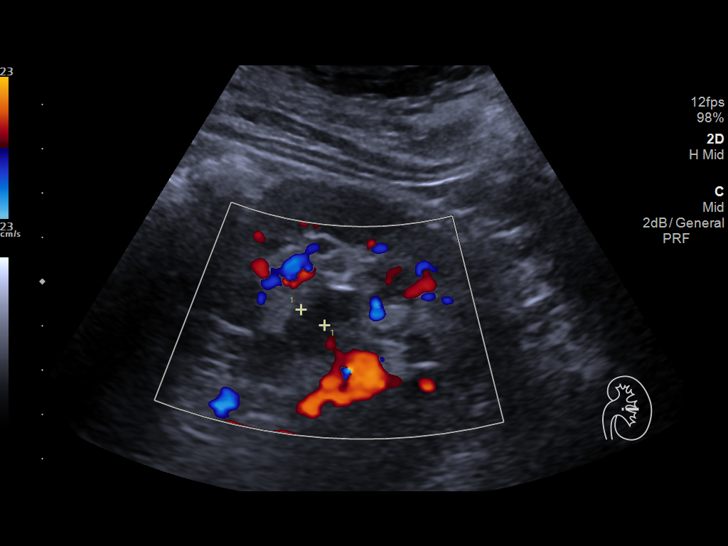
[im 77/77]
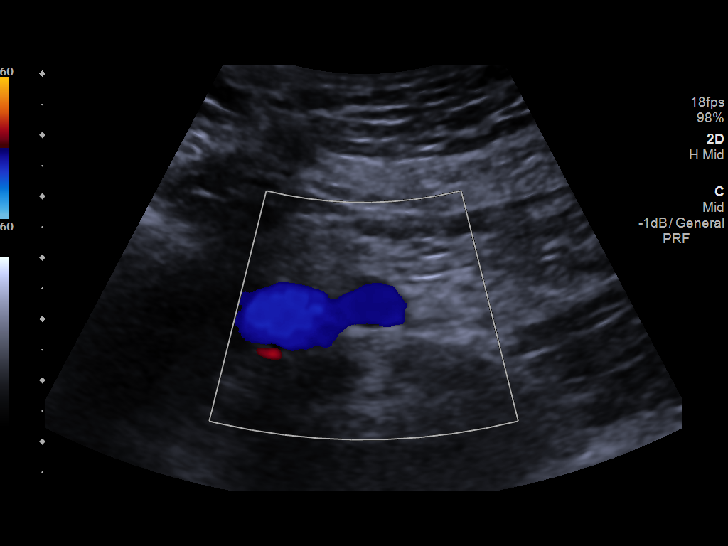

[14 of 25 positions shown; findings below may reference images not displayed]

FINDINGS: Gallbladder: Normally distended without stones or wall thickening.
No pericholecystic fluid or sonographic Murphy sign.

Common bile duct: Diameter: 4 mm diameter, normal

Liver: Normal echogenicity. No mass or nodularity. Portal vein is
patent on color Doppler imaging with normal direction of blood flow
towards the liver.

IVC: Normal appearance

Pancreas: Normal appearance

Spleen: Normal appearance, 6.5 cm length

Right Kidney: Length: 11.0 cm. Normal cortical thickness and
echogenicity. Minimal pelviectasis without caliectasis. No renal
mass identified.

Left Kidney: Length: 11.2 cm. Normal cortical thickness and
echogenicity. Minimal pelviectasis without caliectasis. No renal
mass.

Abdominal aorta: Normal caliber

Other findings: No free fluid
IMPRESSION: Minimal renal pelviectasis bilaterally without gross hydronephrosis.

Remainder of exam unremarkable.

## 2020-06-17 NOTE — Progress Notes (Signed)
[place] Phone: [phone] Subjective:   Kendra Austin, am serving as a scribe for Dr. Hulan Saas. This visit occurred during the SARS-CoV-2 public health emergency.  Safety protocols were in place, including screening questions prior to the visit, additional usage of staff PPE, and extensive cleaning of exam room while observing appropriate contact time as indicated for disinfecting solutions.   I'm seeing this patient by the request  of:  Kuneff, Renee A, DO  CC: Back and neck pain follow-up  [id number]  Kendra Austin is a 55 y.o. female coming in with complaint of back and neck pain. OMT 05/17/2020. Patient states that her lower back pain increased due to stress but the pain has improved.   Medications patient has been prescribed: None          Reviewed prior external information including notes and imaging from previsou exam, outside providers and external EMR if available.   As well as notes that were available from care everywhere and other healthcare systems.  Past medical history, social, surgical and family history all reviewed in electronic medical record.  Austin pertanent information unless stated regarding to the chief complaint.   Past Medical History:  Diagnosis Date  . Allergy   . Arthritis    osteoarthritis; neck, R shoulder  . Atypical chest pain    has had prior cardiac eval in the past/ started 4 years ago/ corrected by back adjustmment  . Chicken pox   . Complication of anesthesia    Per pt, sensitive to sedation!  . Depression    related to stress from prior job; Austin medications, hospitalizations or SI  . Diabetes mellitus without complication ([place])   . DUB (dysfunctional uterine bleeding) 07/22/2015  . Essential tremor 07/22/2015   left hand  . Genital warts   . GERD (gastroesophageal reflux disease)   . Hemorrhoids   . History of UTI   . Hyperglycemia    Pre-diabetic  per pt!  . Hyperlipidemia   . Seasonal allergies 07/22/2015  . Vertigo    resolves with chiropratic; and sees Dr. Tamala Julian    Allergies  Allergen Reactions  . Benadryl [Diphenhydramine Hcl (Sleep)]     Dizzy, extremely drowsy, can cause a lot effects per pt!  . Other     Muscle relaxers-per patient she "cannot function"  . Aleve [Naproxen Sodium]     Heart palpitations - can take ibuprofen  . Flexeril [Cyclobenzaprine] Other (See Comments)    Makes her extremely tired  . Meloxicam     Made her depressed     Review of Systems:  Austin headache, visual changes, nausea, vomiting, diarrhea, constipation, dizziness, abdominal pain, skin rash, fevers, chills, night sweats, weight loss, swollen lymph nodes, body aches, joint swelling, chest pain, shortness of breath, mood changes. POSITIVE muscle aches  Objective  Blood pressure 116/80, height 5\' 4"  (1.626 m), weight 172 lb (78 kg), last menstrual period 01/11/2018, SpO2 97 %.   General: Austin apparent distress alert and oriented x3 mood and affect normal, dressed appropriately.  HEENT: Pupils equal, extraocular movements intact  Respiratory: Patient's speak in full sentences and does not appear short of breath  Cardiovascular: Austin lower extremity edema, non tender, Austin erythema  Patient back does have some mild tightness noted in the thoracolumbar juncture.  Mild tightness noted of the right sacroiliac joint.  Patient also has some very mild discomfort of the right parascapular region.  Mild  tightness noted in the cervical spine area as well  Osteopathic findings  C7 flexed rotated and side bent right T9 extended rotated and side bent right L2 flexed rotated and side bent right Sacrum right on right       Assessment and Plan:  Posture imbalance Patient has responded relatively well again.  We have discussed potential imaging previously but is doing good.  Discussed with patient icing regimen and home exercises.  We discussed avoiding  certain activities.  Follow-up with me again 6 to 8 weeks   Nonallopathic problems  Decision today to treat with OMT was based on Physical Exam  After verbal consent patient was treated with HVLA, ME, FPR techniques in cervical,  thoracic, lumbar, and sacral  areas  Patient tolerated the procedure well with improvement in symptoms  Patient given exercises, stretches and lifestyle modifications  See medications in patient instructions if given  Patient will follow up in 4-8 weeks      The above documentation has been reviewed and is accurate and complete Lyndal Pulley, DO       Note: This dictation was prepared with Dragon dictation along with smaller phrase technology. Any transcriptional errors that result from this process are unintentional.

## 2020-06-18 ENCOUNTER — Other Ambulatory Visit: Payer: Self-pay

## 2020-06-18 ENCOUNTER — Encounter: Payer: Self-pay | Admitting: Family Medicine

## 2020-06-18 ENCOUNTER — Ambulatory Visit: Payer: BC Managed Care – PPO | Admitting: Family Medicine

## 2020-06-18 VITALS — BP 116/80 | Ht 64.0 in | Wt 172.0 lb

## 2020-06-18 DIAGNOSIS — R293 Abnormal posture: Secondary | ICD-10-CM

## 2020-06-18 DIAGNOSIS — M9902 Segmental and somatic dysfunction of thoracic region: Secondary | ICD-10-CM | POA: Diagnosis not present

## 2020-06-18 DIAGNOSIS — M9901 Segmental and somatic dysfunction of cervical region: Secondary | ICD-10-CM | POA: Diagnosis not present

## 2020-06-18 DIAGNOSIS — M9903 Segmental and somatic dysfunction of lumbar region: Secondary | ICD-10-CM | POA: Diagnosis not present

## 2020-06-18 NOTE — Assessment & Plan Note (Signed)
Patient has responded relatively well again.  We have discussed potential imaging previously but is doing good.  Discussed with patient icing regimen and home exercises.  We discussed avoiding certain activities.  Follow-up with me again 6 to 8 weeks

## 2020-06-18 NOTE — Patient Instructions (Signed)
Have a wonderful trip See me in 7-8 weeks

## 2020-07-09 DIAGNOSIS — L821 Other seborrheic keratosis: Secondary | ICD-10-CM

## 2020-07-09 DIAGNOSIS — L814 Other melanin hyperpigmentation: Secondary | ICD-10-CM

## 2020-07-09 DIAGNOSIS — Z808 Family history of malignant neoplasm of other organs or systems: Secondary | ICD-10-CM | POA: Insufficient documentation

## 2020-07-09 DIAGNOSIS — L578 Other skin changes due to chronic exposure to nonionizing radiation: Secondary | ICD-10-CM | POA: Insufficient documentation

## 2020-07-09 DIAGNOSIS — D1801 Hemangioma of skin and subcutaneous tissue: Secondary | ICD-10-CM | POA: Insufficient documentation

## 2020-07-09 DIAGNOSIS — Q825 Congenital non-neoplastic nevus: Secondary | ICD-10-CM | POA: Insufficient documentation

## 2020-07-09 HISTORY — DX: Other melanin hyperpigmentation: L81.4

## 2020-07-09 HISTORY — DX: Family history of malignant neoplasm of other organs or systems: Z80.8

## 2020-07-09 HISTORY — DX: Other seborrheic keratosis: L82.1

## 2020-07-12 ENCOUNTER — Telehealth: Payer: BC Managed Care – PPO | Admitting: Family Medicine

## 2020-07-12 ENCOUNTER — Encounter: Payer: Self-pay | Admitting: Family Medicine

## 2020-07-12 VITALS — Ht 64.0 in | Wt 168.5 lb

## 2020-07-12 DIAGNOSIS — U071 COVID-19: Secondary | ICD-10-CM

## 2020-07-12 NOTE — Progress Notes (Signed)
[facility] [address] [place] [address] Dept: [phone] Dept Fax: [phone]  Virtual Video Visit  I connected with Kendra Austin on 07/12/20 at  2:00 PM EDT by a video enabled telemedicine application and verified that I am speaking with the correct person using two identifiers.  Location patient: Home Location provider: Clinic Persons participating in the virtual visit: Patient, Provider  I discussed the limitations of evaluation and management by telemedicine and the availability of in person appointments. The patient expressed understanding and agreed to proceed.  Chief Complaint  Patient presents with   Acute Visit    C/o testing positive for Covid on 07/04/20 after returning from [place] (cruise). Having persistent cough and sinus HA, stuffy nose, post nasal drip.     She has taken Tyenol the first week and has since start Flonase.       SUBJECTIVE:  HPI: Kendra Austin is a 55 y.o. female who presents with a 6-day history of sinus congestion, headache and pressure, post-nasal drip, and an associated cough. She was recently on a cruise to the [place] with her mother. They both tested positive for COVID-19 on the last day of the cruise. They then stayed 5 days in [place] before returning home. She has used some Tylenol and restarted using Flonase yesterday. She denies any fever. She has noted some ear congestion. She denies any wheezing. She admits to some tenderness over the sinuses when bending over, but not necessarily when tapping over the sinuses and no maxillary teeth sensitivity.  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 radiation 07/09/2020   Seborrheic keratosis 07/09/2020   [place] stain of skin 07/09/2020   Lentigo 07/09/2020   Hemangioma of skin and subcutaneous tissue 07/09/2020   Family history of skin cancer 07/09/2020    Anxiety state 06/24/2019   Overweight (BMI 25.0-29.9) 04/22/2019   Frozen shoulder 11/07/2018   Type 2 diabetes mellitus with other specified complication ([place]) 38/25/0539   Dyspepsia 05/12/2018   Hyperlipidemia associated with type 2 diabetes mellitus ([place]) 03/27/2016   Essential tremor 07/22/2015   Seasonal allergies 07/22/2015   DUB (dysfunctional uterine bleeding) 07/22/2015   Posture imbalance 11/26/2014   Nonallopathic lesion of cervical region 11/26/2014   Nonallopathic lesion of thoracic region 11/26/2014   Nonallopathic lesion of lumbosacral region 11/26/2014   Biomechanical lesion, unspecified 11/26/2014   Past Surgical History:  Procedure Laterality Date   COLONOSCOPY     GANGLION CYST EXCISION     right wrist   HYSTEROSCOPY     TONSILLECTOMY AND ADENOIDECTOMY  1971/1972   UPPER GASTROINTESTINAL ENDOSCOPY     WISDOM TOOTH EXTRACTION     Family History  Problem Relation Age of Onset   Macular degeneration Mother    Colon polyps Mother    Heart disease Father    Diabetes Father    Hyperlipidemia Father    Macular degeneration Maternal Grandmother    Heart disease Maternal Grandmother    Stroke Maternal Grandmother    Heart disease Paternal Grandmother    Heart attack Paternal Grandmother    Heart disease Paternal Grandfather    Heart attack Paternal Grandfather    Colon cancer Neg Hx    Esophageal cancer Neg Hx    Stomach cancer Neg Hx    Rectal cancer Neg Hx    Social History   Tobacco Use   Smoking status: Never  Smokeless tobacco: Never  Vaping Use   Vaping Use: Never used  Substance Use Topics   Alcohol use: Yes    Alcohol/week: 0.0 standard drinks    Comment: Socially    Drug use: No    Current Outpatient Medications:    Cholecalciferol (VITAMIN D PO), Take by mouth., Disp: , Rfl:    famotidine (PEPCID) 20 MG tablet, Take 1 tablet (20 mg total) by mouth 2 (two) times daily., Disp: 60 tablet, Rfl: 5   fluticasone (FLONASE) 50 MCG/ACT nasal  spray, Place 2 sprays into both nostrils daily., Disp: , Rfl:    Lactobacillus Rhamnosus, GG, (CULTURELLE PO), Take by mouth daily., Disp: , Rfl:    LUTEIN PO, Take by mouth., Disp: , Rfl:    Multiple Vitamins-Minerals (MULTIVITAMIN ADULT PO), Take by mouth., Disp: , Rfl:    Omega-3 Fatty Acids (FISH OIL PO), Take by mouth., Disp: , Rfl:    omeprazole (PRILOSEC) 40 MG capsule, TAKE 1 CAPSULE BY MOUTH EVERY DAY, Disp: 90 capsule, Rfl: 1   rosuvastatin (CRESTOR) 5 MG tablet, Take 1 tablet (5 mg total) by mouth daily., Disp: 90 tablet, Rfl: 3   TURMERIC PO, Take 1 capsule by mouth daily. , Disp: , Rfl:    UNABLE TO FIND, 1,500 mg. Med Name: Calcium pyruvate, Disp: , Rfl:   Current Facility-Administered Medications:    0.9 %  sodium chloride infusion, 500 mL, Intravenous, Once, Danis, Estill Cotta III, MD  Allergies  Allergen Reactions   Benadryl [Diphenhydramine Hcl (Sleep)]     Dizzy, extremely drowsy, can cause a lot effects per pt!   Other     Muscle relaxers-per patient she "cannot function"   Aleve [Naproxen Sodium]     Heart palpitations - can take ibuprofen   Flexeril [Cyclobenzaprine] Other (See Comments)    Makes her extremely tired   Meloxicam     Made her depressed   ROS: See pertinent positives and negatives per HPI.  OBSERVATIONS/OBJECTIVE:  VITALS per patient if applicable: Today's Vitals   07/12/20 1401  Weight: 168 lb 8 oz (76.4 kg)  Height: 5\' 4"  (1.626 m)   Body mass index is 28.92 kg/m.   GENERAL: Alert and oriented. Appears well and in no acute distress.  HEENT: Atraumatic. Conjunctiva clear. No obvious abnormalities on inspection of external nose and ears.  NECK: Normal movements of the head and neck.  LUNGS: On inspection, no signs of respiratory distress. Breathing rate appears normal. No obvious gross SOB, gasping or wheezing, and no conversational dyspnea.  CV: No obvious cyanosis.  PSYCH/NEURO: Pleasant and cooperative. No obvious depression or  anxiety. Speech and thought processing grossly intact.  ASSESSMENT AND PLAN:  1. COVID-19 Reviewed home care instructions for COVID. Advised self-isolation at home for at least 5 days. After 5 days, if improved and fever resolved, can be in public, but should wear a mask around others for an additional 5 days. If symptoms, esp, dyspnea develops/worsens, recommend in-person evaluation at either an urgent care or the emergency room.  Recommend use of an antihistamine to reduce rhinorrhea. As she has reacted to the older, sedating antihistamines, she will try and use Allegra. I also advised use of saline nasal washes (nettie pot). If symptoms are not improved by next week, she should reach out to consider a course of antibiotics.   I discussed the assessment and treatment plan with the patient. The patient was provided an opportunity to ask questions and all were answered. The patient agreed with  the plan and demonstrated an understanding of the instructions.   The patient was advised to call back or seek an in-person evaluation if the symptoms worsen or if the condition fails to improve as anticipated.   Haydee Salter, MD

## 2020-07-15 ENCOUNTER — Ambulatory Visit: Payer: BC Managed Care – PPO | Admitting: Psychology

## 2020-07-22 ENCOUNTER — Ambulatory Visit (INDEPENDENT_AMBULATORY_CARE_PROVIDER_SITE_OTHER): Payer: BC Managed Care – PPO | Admitting: Psychology

## 2020-07-22 DIAGNOSIS — F419 Anxiety disorder, unspecified: Secondary | ICD-10-CM | POA: Diagnosis not present

## 2020-07-23 ENCOUNTER — Encounter: Payer: Self-pay | Admitting: Family Medicine

## 2020-07-23 ENCOUNTER — Other Ambulatory Visit: Payer: Self-pay

## 2020-07-23 ENCOUNTER — Telehealth (INDEPENDENT_AMBULATORY_CARE_PROVIDER_SITE_OTHER): Payer: BC Managed Care – PPO | Admitting: Family Medicine

## 2020-07-23 DIAGNOSIS — J329 Chronic sinusitis, unspecified: Secondary | ICD-10-CM | POA: Diagnosis not present

## 2020-07-23 DIAGNOSIS — B9689 Other specified bacterial agents as the cause of diseases classified elsewhere: Secondary | ICD-10-CM

## 2020-07-23 DIAGNOSIS — U071 COVID-19: Secondary | ICD-10-CM

## 2020-07-23 MED ORDER — FAMOTIDINE 20 MG PO TABS: 20.0000 mg | ORAL_TABLET | Freq: Two times a day (BID) | ORAL | 5 refills | Status: AC

## 2020-07-23 MED ORDER — DOXYCYCLINE HYCLATE 100 MG PO TABS: 100.0000 mg | ORAL_TABLET | Freq: Two times a day (BID) | ORAL | 1 refills | Status: AC

## 2020-07-23 MED ORDER — OMEPRAZOLE 40 MG PO CPDR: DELAYED_RELEASE_CAPSULE | ORAL | 1 refills | Status: AC

## 2020-07-23 NOTE — Progress Notes (Signed)
VIRTUAL VISIT VIA VIDEO  I connected with [name] on 07/23/20 at 11:00 AM EDT by elemedicine application and verified that I am speaking with the correct person using two identifiers. Location patient: Home Location provider: [facility], Office Persons participating in the virtual visit: Patient, Dr. Raoul Pitch and Darnell Level. Cesar, CMA  I discussed the limitations of evaluation and management by telemedicine and the availability of in person appointments. The patient expressed understanding and agreed to proceed.   SUBJECTIVE Chief Complaint  Patient presents with   Nasal Congestion    Pt c/o post nasal drip, cough, sinus pressure, ear fullness x 2 weeks;     HPI: Kendra Austin is a 55 y.o. female present for acute illness of 2 weeks duration. She complains of a postnasal drip, cough, sinus pressure and ear fullness.  She unfortunately contracted COVID on her cruise a little over 2 weeks ago. She states she was rather asymptomatic. However, since then she has progressed to sinus pressure, headache, PND.  She is taking Flonase, allegra.  ROS: See pertinent positives and negatives per HPI.  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 radiation 07/09/2020   Seborrheic keratosis 07/09/2020   [place] stain of skin 07/09/2020   Lentigo 07/09/2020   Hemangioma of skin and subcutaneous tissue 07/09/2020   Family history of skin cancer 07/09/2020   Anxiety state 06/24/2019   Overweight (BMI 25.0-29.9) 04/22/2019   Frozen shoulder 11/07/2018   Type 2 diabetes mellitus with other specified complication ([place]) 78/67/6720   Dyspepsia 05/12/2018   Hyperlipidemia associated with type 2 diabetes mellitus ([place]) 03/27/2016   Essential tremor 07/22/2015   Seasonal allergies 07/22/2015   DUB (dysfunctional uterine bleeding) 07/22/2015   Posture imbalance 11/26/2014   Nonallopathic lesion of cervical region 11/26/2014    Nonallopathic lesion of thoracic region 11/26/2014   Nonallopathic lesion of lumbosacral region 11/26/2014   Biomechanical lesion, unspecified 11/26/2014    Social History   Tobacco Use   Smoking status: Never   Smokeless tobacco: Never  Substance Use Topics   Alcohol use: Yes    Alcohol/week: 0.0 standard drinks    Comment: Socially     Current Outpatient Medications:    Cholecalciferol (VITAMIN D PO), Take by mouth., Disp: , Rfl:    doxycycline (VIBRA-TABS) 100 MG tablet, Take 1 tablet (100 mg total) by mouth 2 (two) times daily., Disp: 20 tablet, Rfl: 1   fexofenadine (ALLEGRA) 180 MG tablet, Take 180 mg by mouth daily., Disp: , Rfl:    fluticasone (FLONASE) 50 MCG/ACT nasal spray, Place 2 sprays into both nostrils daily., Disp: , Rfl:    Lactobacillus Rhamnosus, GG, (CULTURELLE PO), Take by mouth daily., Disp: , Rfl:    LUTEIN PO, Take by mouth., Disp: , Rfl:    Multiple Vitamins-Minerals (MULTIVITAMIN ADULT PO), Take by mouth., Disp: , Rfl:    Omega-3 Fatty Acids (FISH OIL PO), Take by mouth., Disp: , Rfl:    rosuvastatin (CRESTOR) 5 MG tablet, Take 1 tablet (5 mg total) by mouth daily., Disp: 90 tablet, Rfl: 3   TURMERIC PO, Take 1 capsule by mouth daily. , Disp: , Rfl:    UNABLE TO FIND, 1,500 mg. Med Name: Calcium pyruvate, Disp: , Rfl:    famotidine (PEPCID) 20 MG tablet, Take 1 tablet (20 mg total) by mouth 2 (two) times daily., Disp: 60 tablet, Rfl: 5   omeprazole (PRILOSEC) 40 MG capsule,  TAKE 1 CAPSULE BY MOUTH EVERY DAY, Disp: 90 capsule, Rfl: 1  Current Facility-Administered Medications:    0.9 %  sodium chloride infusion, 500 mL, Intravenous, Once, Danis, Estill Cotta III, MD  Allergies  Allergen Reactions   Benadryl [Diphenhydramine Hcl (Sleep)]     Dizzy, extremely drowsy, can cause a lot effects per pt!   Other     Muscle relaxers-per patient she "cannot function"   Aleve [Naproxen Sodium]     Heart palpitations - can take ibuprofen   Flexeril [Cyclobenzaprine]  Other (See Comments)    Makes her extremely tired   Meloxicam     Made her depressed    OBJECTIVE: LMP 01/11/2018  Gen: No acute distress. Nontoxic in appearance.  HENT: AT. [place].  MMM.  Eyes:Pupils Equal Round Reactive to light, Extraocular movements intact,  Conjunctiva without redness, discharge or icterus. Chest: Cough not present today Skin: no rashes, purpura or petechiae.  Neuro: Alert. Oriented x3  Psych: Normal affect and demeanor. Normal speech. Normal thought content and judgment.  ASSESSMENT AND PLAN: [name] is a 55 y.o. female present for  Bacterial sinusitis s/p COVID illness Rest, hydrate.  +/- flonase, mucinex (DM if cough), nettie pot or nasal saline.  Doxycyline prescribed, take until completed.  If cough present it can last up to 6-8 weeks.  F/U 2 weeks of not improved.   Pt moving to [place] in the few months.   Howard Pouch, DO 07/23/2020   Return if symptoms worsen or fail to improve.  No orders of the defined types were placed in this encounter.  Meds ordered this encounter  Medications   doxycycline (VIBRA-TABS) 100 MG tablet    Sig: Take 1 tablet (100 mg total) by mouth 2 (two) times daily.    Dispense:  20 tablet    Refill:  1   omeprazole (PRILOSEC) 40 MG capsule    Sig: TAKE 1 CAPSULE BY MOUTH EVERY DAY    Dispense:  90 capsule    Refill:  1   famotidine (PEPCID) 20 MG tablet    Sig: Take 1 tablet (20 mg total) by mouth 2 (two) times daily.    Dispense:  60 tablet    Refill:  5    Referral Orders  No referral(s) requested today

## 2020-07-23 NOTE — Patient Instructions (Signed)

## 2020-08-09 NOTE — Progress Notes (Signed)
[facility] [place] Phone: [phone] Subjective:   [name], am serving as a scribe for Dr. Hulan Saas.  This visit occurred during the SARS-CoV-2 public health emergency.  Safety protocols were in place, including screening questions prior to the visit, additional usage of staff PPE, and extensive cleaning of exam room while observing appropriate contact time as indicated for disinfecting solutions.    I'm seeing this patient by the request  of:  Kuneff, Renee A, DO  CC: Back and neck pain follow-up  QVZ:[id number]  ADELISA SATTERWHITE is a 55 y.o. female coming in with complaint of back and neck pain. OMT 06/18/2020. Patient states that she is feeling relatively good overall.  Very mild discomfort but nothing severe.  Patient has been packing and has not noticed any exacerbation.  Medications patient has been prescribed: None        Past Medical History:  Diagnosis Date   Allergy    Arthritis    osteoarthritis; neck, R shoulder   Atypical chest pain    has had prior cardiac eval in the past/ started 4 years ago/ corrected by back adjustmment   Biomechanical lesion, unspecified 11/26/2014   Formatting of this note might be different from the original. Last Assessment & Plan:  Formatting of this note might be different from the original.  Decision today to treat with OMT was based on Physical Exam  After verbal consent patient was treated with HVLA, ME, FPR techniques in cervical, thoracic, lumbar and sacral areas, all areas are chronic   Patient tolerated the procedure well with impr   Chicken pox    Complication of anesthesia    Per pt, sensitive to sedation!   Depression    related to stress from prior job; no medications, hospitalizations or SI   Diabetes mellitus without complication ([place])    DUB (dysfunctional uterine bleeding) 07/22/2015   Essential tremor 07/22/2015   left hand   Family history of skin  cancer 07/09/2020   Frozen shoulder 11/07/2018   Genital warts    GERD (gastroesophageal reflux disease)    Hemorrhoids    History of UTI    Hyperglycemia    Pre-diabetic per pt!   Hyperlipidemia    Lentigo 07/09/2020   Seasonal allergies 07/22/2015   Seborrheic keratosis 07/09/2020   Vertigo    resolves with chiropratic; and sees Dr. Tamala Julian    Allergies  Allergen Reactions   Benadryl [Diphenhydramine Hcl (Sleep)]     Dizzy, extremely drowsy, can cause a lot effects per pt!   Other     Muscle relaxers-per patient she "cannot function"   Aleve [Naproxen Sodium]     Heart palpitations - can take ibuprofen   Flexeril [Cyclobenzaprine] Other (See Comments)    Makes her extremely tired   Meloxicam     Made her depressed     Review of Systems:  No headache, visual changes, nausea, vomiting, diarrhea, constipation, dizziness, abdominal pain, skin rash, fevers, chills, night sweats, weight loss, swollen lymph nodes, body aches, joint swelling, chest pain, shortness of breath, mood changes. POSITIVE muscle aches  Objective  Blood pressure 102/70, pulse 76, height 5\' 4"  (1.626 m), weight 170 lb (77.1 kg), last menstrual period 01/11/2018, SpO2 98 %.   General: No apparent distress alert and oriented x3 mood and affect normal, dressed appropriately.  HEENT: Pupils equal, extraocular movements intact  Respiratory: Patient's speak in full sentences and does not appear short  of breath  Cardiovascular: No lower extremity edema, non tender, no erythema  Neuro: Cranial nerves II through XII are intact, neurovascularly intact in all extremities with 2+ DTRs and 2+ pulses.  Gait normal with good balance and coordination.  MSK:  Non tender with full range of motion and good stability and symmetric strength and tone of shoulders, elbows, wrist, hip, knee and ankles bilaterally.  Back - Normal skin, Spine with normal alignment and no deformity.  No tenderness to vertebral process palpation.   Paraspinous muscles are not tender and without spasm.   Range of motion is full at neck and lumbar sacral regions  Osteopathic findings  C5 flexed rotated and side bent right T5 extended rotated and side bent left L2 flexed rotated and side bent right Sacrum right on right       Assessment and Plan:  Low back pain Patient is doing very well with conservative therapy.  We will be moving.  Encourage patient to get brain stress and we will forward any type of notes necessary.  Patient is not on any medications from [place] at this point.  Continue to work on core strengthening and follow-up as needed   Nonallopathic problems  Decision today to treat with OMT was based on Physical Exam  After verbal consent patient was treated with HVLA, ME, FPR techniques in cervical,  thoracic, lumbar, and sacral  areas  Patient tolerated the procedure well with improvement in symptoms  Patient given exercises, stretches and lifestyle modifications  See medications in patient instructions if given  Patient will follow up in 4-8 weeks      The above documentation has been reviewed and is accurate and complete Lyndal Pulley, DO       Note: This dictation was prepared with Dragon dictation along with smaller phrase technology. Any transcriptional errors that result from this process are unintentional.

## 2020-08-11 ENCOUNTER — Ambulatory Visit: Payer: BC Managed Care – PPO | Admitting: Family Medicine

## 2020-08-11 ENCOUNTER — Other Ambulatory Visit: Payer: Self-pay

## 2020-08-11 ENCOUNTER — Encounter: Payer: Self-pay | Admitting: Family Medicine

## 2020-08-11 VITALS — BP 102/70 | HR 76 | Ht 64.0 in | Wt 170.0 lb

## 2020-08-11 DIAGNOSIS — M9902 Segmental and somatic dysfunction of thoracic region: Secondary | ICD-10-CM | POA: Diagnosis not present

## 2020-08-11 DIAGNOSIS — M9903 Segmental and somatic dysfunction of lumbar region: Secondary | ICD-10-CM | POA: Diagnosis not present

## 2020-08-11 DIAGNOSIS — M9901 Segmental and somatic dysfunction of cervical region: Secondary | ICD-10-CM | POA: Diagnosis not present

## 2020-08-11 DIAGNOSIS — M545 Low back pain, unspecified: Secondary | ICD-10-CM

## 2020-08-11 NOTE — Assessment & Plan Note (Signed)
Patient is doing very well with conservative therapy.  We will be moving.  Encourage patient to get brain stress and we will forward any type of notes necessary.  Patient is not on any medications from [place] at this point.  Continue to work on core strengthening and follow-up as needed

## 2020-08-11 NOTE — Patient Instructions (Signed)
Good to see you Good luck with the mood Be proud of the strides you have made Send any names if you want me to research them You know where I am!

## 2020-12-27 ENCOUNTER — Encounter: Payer: BC Managed Care – PPO | Admitting: Family Medicine

## 2021-08-31 ENCOUNTER — Encounter (INDEPENDENT_AMBULATORY_CARE_PROVIDER_SITE_OTHER): Payer: Self-pay

## 2021-09-10 IMAGING — DX DG LUMBAR SPINE 2-3V
3 series · 3 of 3 positions shown · non-contrast
Comparison: None.

CLINICAL DATA: 53-year-old female with 2 months of low back pain.

EXAM:
LUMBAR SPINE - 2-3 VIEW

[lumbar spine ap]
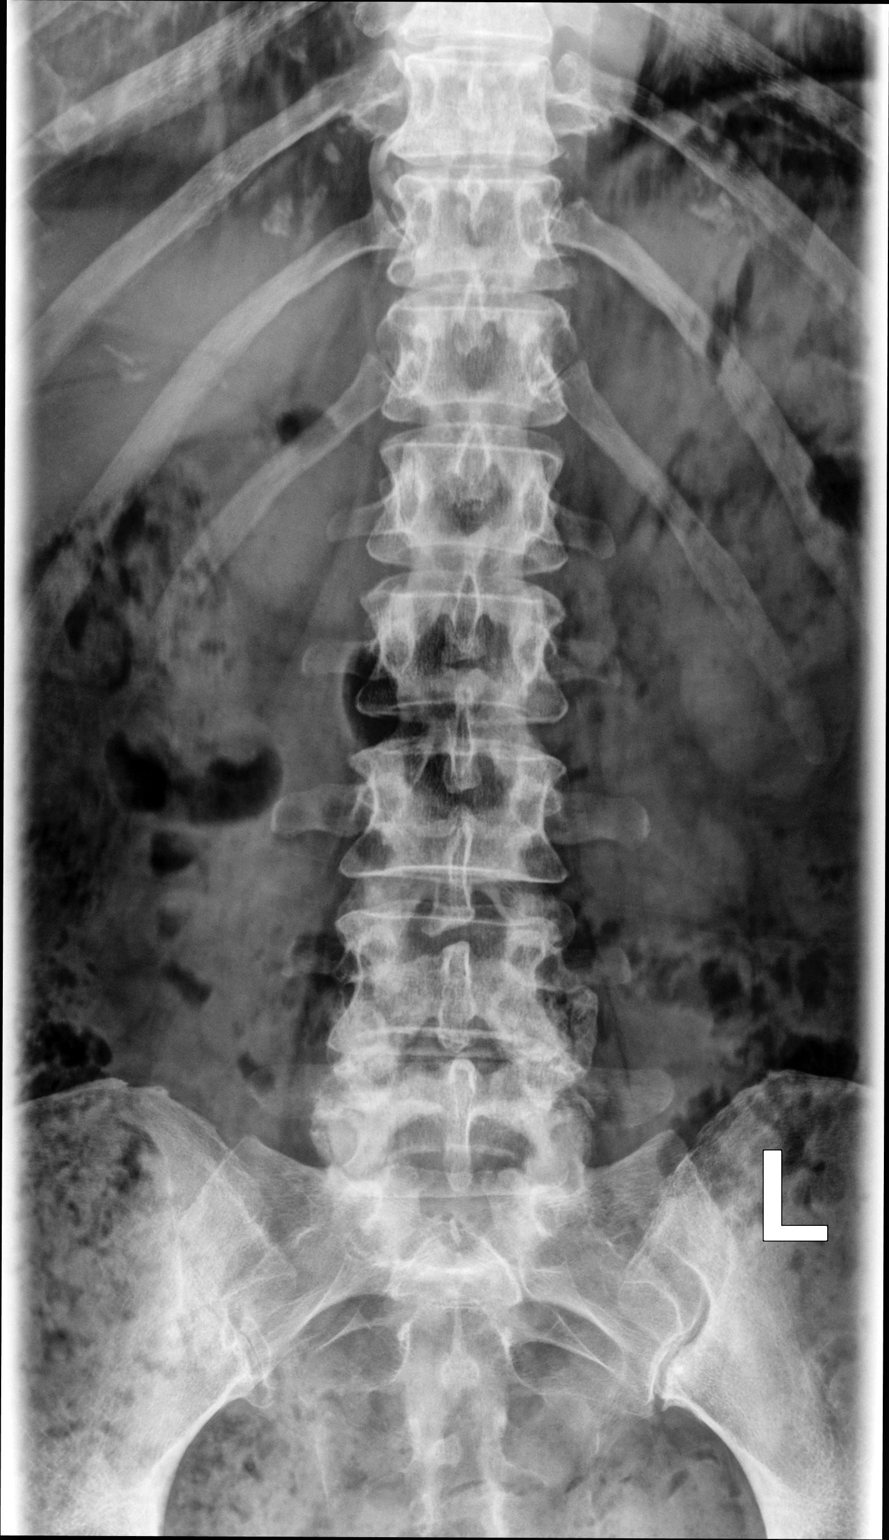

[lumbar spine lat (1 of 2)]
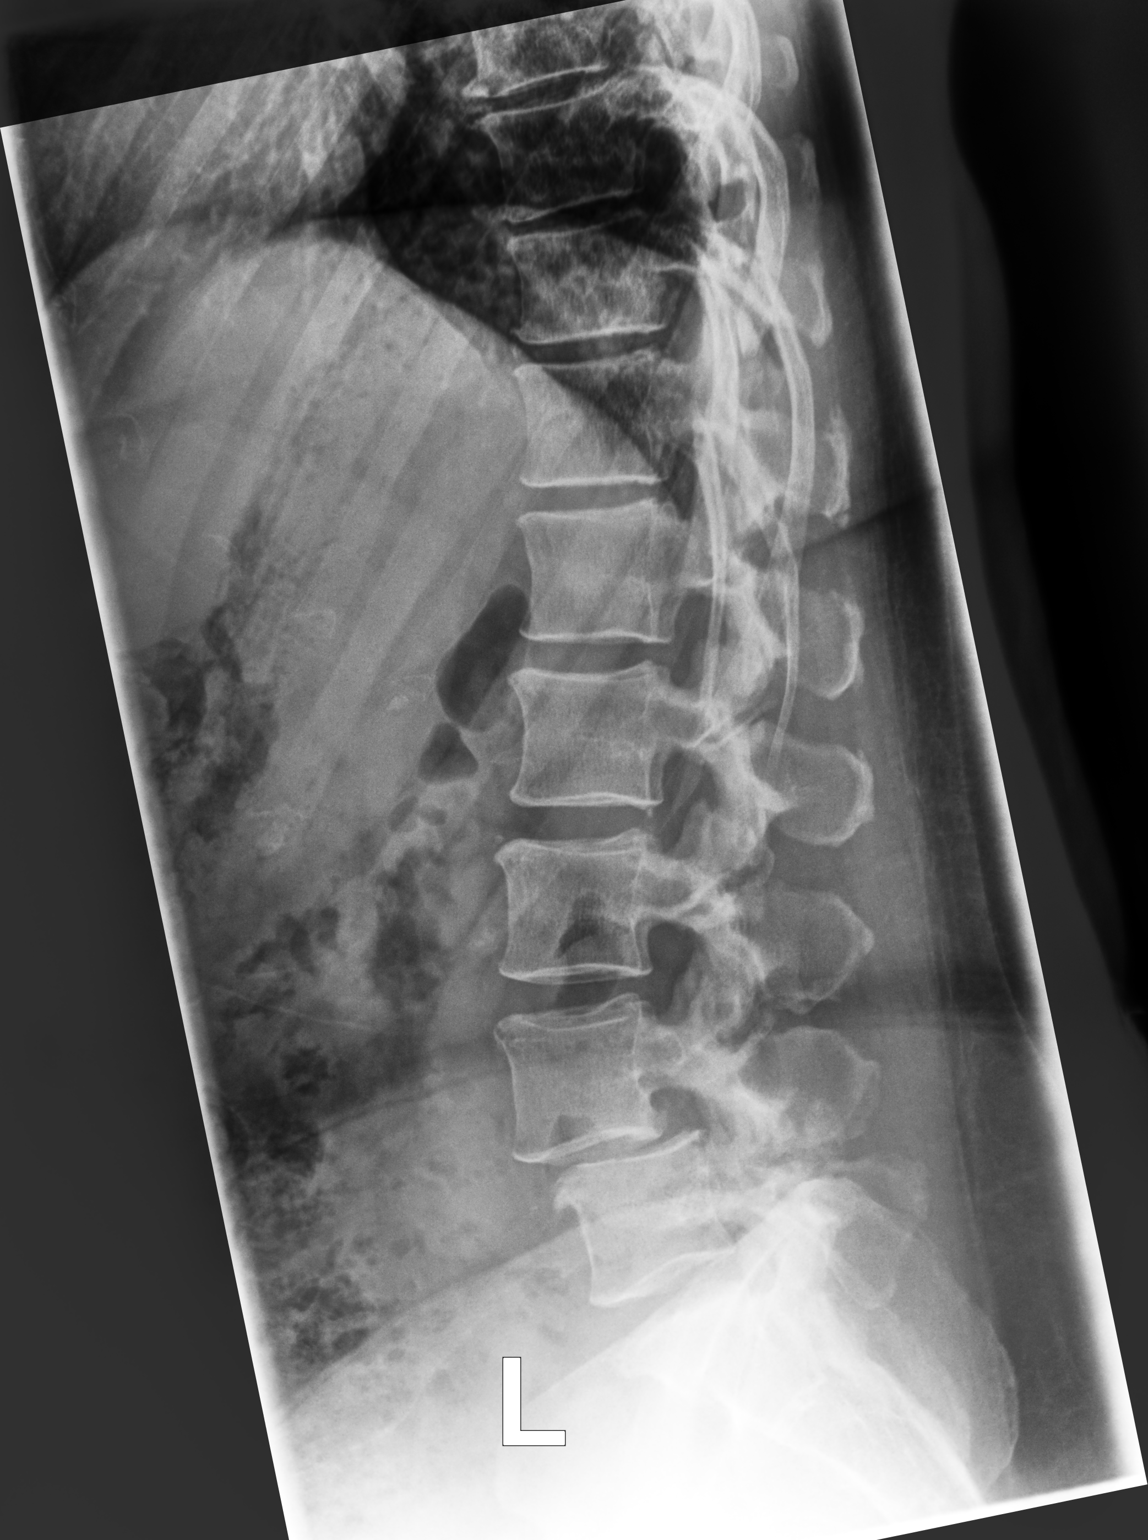

[lumbar spine lat (2 of 2)]
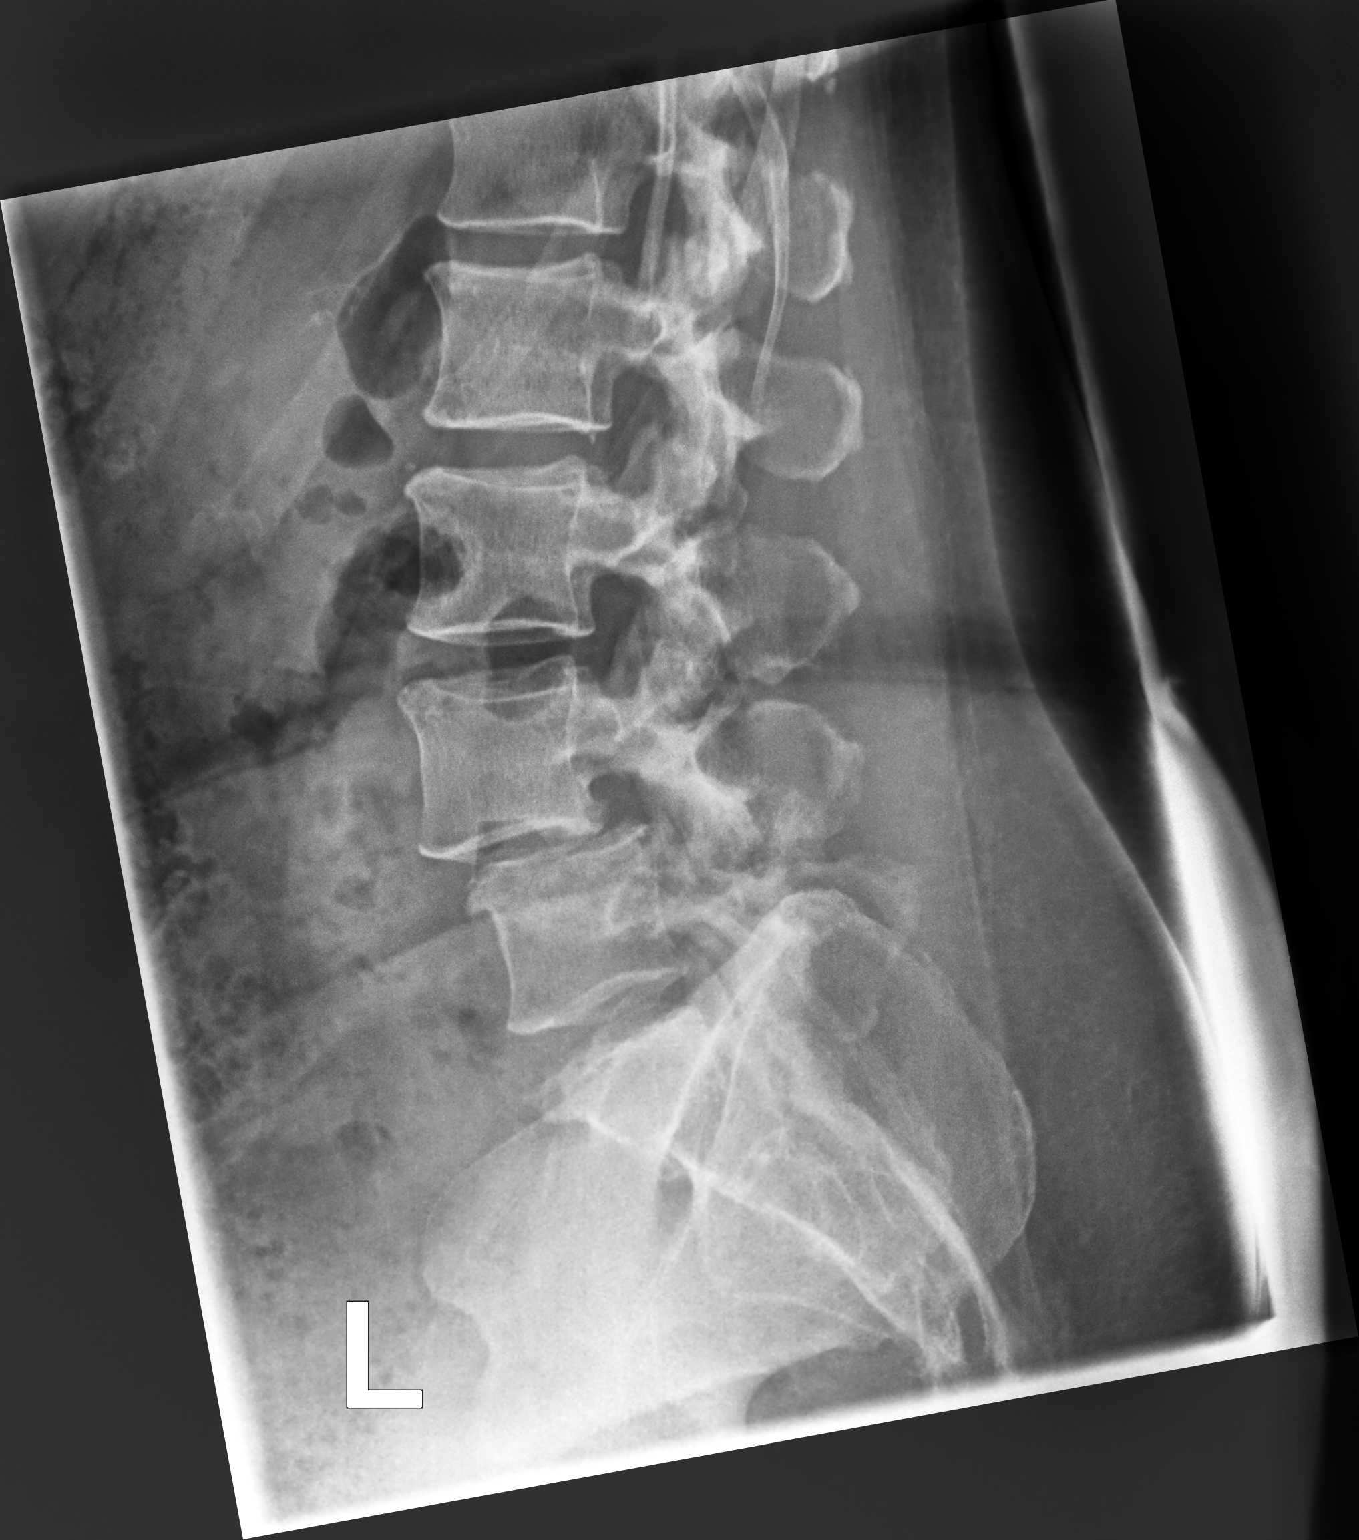

[3 of 3 positions shown; findings below may reference images not displayed]

FINDINGS: Normal lumbar segmentation. Bone mineralization is within normal
limits.

Anterolisthesis of L4 on L5 measures 9-10 mm. Associated severe disc
space loss and moderate facet hypertrophy.

No other spondylolisthesis. Other disc spaces are relatively
preserved. No pars fracture is evident. No acute osseous abnormality
identified. SI joints and sacral ala appear within normal limits.
IMPRESSION: 1. Grade 1-2 spondylolisthesis at L4-L5 with severe disc and facet
degeneration.
2. No acute osseous abnormality identified.
